# Patient Record
Sex: Male | Born: 1950 | Race: White | Hispanic: No | Marital: Married | Smoking: Never smoker
Health system: Southern US, Community
[De-identification: ages and names within clinical notes are randomized; demographics above are authoritative.]

## PROBLEM LIST (undated history)

## (undated) DIAGNOSIS — R002 Palpitations: Secondary | ICD-10-CM

## (undated) DIAGNOSIS — C4490 Unspecified malignant neoplasm of skin, unspecified: Secondary | ICD-10-CM

## (undated) DIAGNOSIS — E785 Hyperlipidemia, unspecified: Secondary | ICD-10-CM

## (undated) DIAGNOSIS — R519 Headache, unspecified: Secondary | ICD-10-CM

## (undated) DIAGNOSIS — R51 Headache: Secondary | ICD-10-CM

## (undated) DIAGNOSIS — I480 Paroxysmal atrial fibrillation: Secondary | ICD-10-CM

## (undated) DIAGNOSIS — I10 Essential (primary) hypertension: Secondary | ICD-10-CM

## (undated) DIAGNOSIS — I499 Cardiac arrhythmia, unspecified: Secondary | ICD-10-CM

## (undated) DIAGNOSIS — R079 Chest pain, unspecified: Secondary | ICD-10-CM

## (undated) HISTORY — PX: CARDIAC CATHETERIZATION: SHX172

## (undated) HISTORY — DX: Chest pain, unspecified: R07.9

## (undated) HISTORY — DX: Paroxysmal atrial fibrillation: I48.0

## (undated) HISTORY — PX: EYE SURGERY: SHX253

## (undated) HISTORY — DX: Hyperlipidemia, unspecified: E78.5

## (undated) HISTORY — PX: CHOLECYSTECTOMY: SHX55

## (undated) HISTORY — PX: HERNIA REPAIR: SHX51

## (undated) HISTORY — PX: FOOT SURGERY: SHX648

## (undated) HISTORY — DX: Palpitations: R00.2

## (undated) HISTORY — DX: Essential (primary) hypertension: I10

## (undated) HISTORY — DX: Unspecified malignant neoplasm of skin, unspecified: C44.90

## (undated) HISTORY — PX: KNEE SURGERY: SHX244

---

## 2004-04-18 ENCOUNTER — Ambulatory Visit: Payer: Self-pay | Admitting: Internal Medicine

## 2005-02-17 ENCOUNTER — Ambulatory Visit: Payer: Self-pay | Admitting: Gastroenterology

## 2006-02-03 ENCOUNTER — Ambulatory Visit: Payer: Self-pay

## 2006-04-27 ENCOUNTER — Ambulatory Visit (HOSPITAL_COMMUNITY): Admission: RE | Admit: 2006-04-27 | Discharge: 2006-04-27 | Payer: Self-pay | Admitting: Cardiovascular Disease

## 2008-08-20 ENCOUNTER — Emergency Department: Payer: Self-pay | Admitting: Unknown Physician Specialty

## 2008-08-21 ENCOUNTER — Emergency Department (HOSPITAL_COMMUNITY): Admission: EM | Admit: 2008-08-21 | Discharge: 2008-08-21 | Payer: Self-pay | Admitting: Emergency Medicine

## 2008-08-28 ENCOUNTER — Inpatient Hospital Stay (HOSPITAL_COMMUNITY): Admission: RE | Admit: 2008-08-28 | Discharge: 2008-08-31 | Payer: Self-pay | Admitting: Specialist

## 2008-08-30 ENCOUNTER — Encounter (INDEPENDENT_AMBULATORY_CARE_PROVIDER_SITE_OTHER): Payer: Self-pay | Admitting: Specialist

## 2008-09-23 ENCOUNTER — Observation Stay (HOSPITAL_COMMUNITY): Admission: EM | Admit: 2008-09-23 | Discharge: 2008-09-24 | Payer: Self-pay | Admitting: Emergency Medicine

## 2008-09-23 ENCOUNTER — Ambulatory Visit: Payer: Self-pay | Admitting: Gastroenterology

## 2009-01-11 ENCOUNTER — Ambulatory Visit (HOSPITAL_COMMUNITY): Admission: RE | Admit: 2009-01-11 | Discharge: 2009-01-11 | Payer: Self-pay | Admitting: Specialist

## 2010-04-24 ENCOUNTER — Ambulatory Visit: Payer: Self-pay | Admitting: Otolaryngology

## 2010-05-16 ENCOUNTER — Ambulatory Visit: Payer: Self-pay | Admitting: Otolaryngology

## 2010-05-27 ENCOUNTER — Encounter: Payer: Self-pay | Admitting: Otolaryngology

## 2010-06-02 ENCOUNTER — Encounter: Payer: Self-pay | Admitting: Otolaryngology

## 2010-06-05 LAB — BASIC METABOLIC PANEL
BUN: 23 mg/dL (ref 6–23)
Calcium: 9.5 mg/dL (ref 8.4–10.5)
Glucose, Bld: 94 mg/dL (ref 70–99)
Sodium: 139 mEq/L (ref 135–145)

## 2010-06-05 LAB — CBC
HCT: 48.7 % (ref 39.0–52.0)
MCHC: 34.4 g/dL (ref 30.0–36.0)
Platelets: 252 10*3/uL (ref 150–400)
RDW: 13.4 % (ref 11.5–15.5)
WBC: 7.3 10*3/uL (ref 4.0–10.5)

## 2010-06-09 LAB — CARDIAC PANEL(CRET KIN+CKTOT+MB+TROPI)
CK, MB: 0.6 ng/mL (ref 0.3–4.0)
CK, MB: 0.7 ng/mL (ref 0.3–4.0)
Relative Index: INVALID (ref 0.0–2.5)
Relative Index: INVALID (ref 0.0–2.5)
Total CK: 50 U/L (ref 7–232)
Total CK: 56 U/L (ref 7–232)

## 2010-06-09 LAB — COMPREHENSIVE METABOLIC PANEL
ALT: 20 U/L (ref 0–53)
AST: 20 U/L (ref 0–37)
Albumin: 4.2 g/dL (ref 3.5–5.2)
CO2: 24 mEq/L (ref 19–32)
Calcium: 9.4 mg/dL (ref 8.4–10.5)
Chloride: 104 mEq/L (ref 96–112)
GFR calc Af Amer: >60 mL/min (ref >60)
GFR calc non Af Amer: >60 mL/min (ref >60)
Potassium: 3.5 mEq/L (ref 3.5–5.1)
Sodium: 138 mEq/L (ref 135–145)
Total Bilirubin: 1 mg/dL (ref 0.3–1.2)
Total Protein: 6.9 g/dL (ref 6.0–8.3)

## 2010-06-09 LAB — DIFFERENTIAL
Basophils Absolute: 0 10*3/uL (ref 0.0–0.1)
Basophils Relative: 0 % (ref 0–1)
Basophils Relative: 0 % (ref 0–1)
Eosinophils Relative: 2 % (ref 0–5)
Lymphocytes Relative: 27 % (ref 12–46)
Lymphocytes Relative: 28 % (ref 12–46)
Lymphs Abs: 2 10*3/uL (ref 0.7–4.0)
Monocytes Absolute: 0.6 10*3/uL (ref 0.1–1.0)
Monocytes Relative: 7 % (ref 3–12)
Monocytes Relative: 8 % (ref 3–12)
Neutro Abs: 4.4 10*3/uL (ref 1.7–7.7)
Neutrophils Relative %: 61 % (ref 43–77)

## 2010-06-09 LAB — TSH: TSH: 1.067 u[IU]/mL (ref 0.350–4.500)

## 2010-06-09 LAB — BASIC METABOLIC PANEL
BUN: 21 mg/dL (ref 6–23)
CO2: 28 mEq/L (ref 19–32)
Creatinine, Ser: 0.93 mg/dL (ref 0.4–1.5)
Glucose, Bld: 105 mg/dL — ABNORMAL HIGH (ref 70–99)

## 2010-06-09 LAB — CBC
Hemoglobin: 14.1 g/dL (ref 13.0–17.0)
MCHC: 34.5 g/dL (ref 30.0–36.0)
RBC: 4.31 MIL/uL (ref 4.22–5.81)
RDW: 13.4 % (ref 11.5–15.5)
WBC: 7.2 10*3/uL (ref 4.0–10.5)
WBC: 8.9 10*3/uL (ref 4.0–10.5)

## 2010-06-09 LAB — POCT CARDIAC MARKERS
CKMB, poc: <1 ng/mL — ABNORMAL LOW (ref 1.0–8.0)
Myoglobin, poc: 41.3 ng/mL (ref 12–200)

## 2010-06-09 LAB — LIPASE, BLOOD: Lipase: 39 U/L (ref 11–59)

## 2010-06-09 LAB — D-DIMER, QUANTITATIVE: D-Dimer, Quant: 0.82 ug/mL-FEU — ABNORMAL HIGH (ref 0.00–0.48)

## 2010-06-09 LAB — CK TOTAL AND CKMB (NOT AT ARMC)
CK, MB: 0.7 ng/mL (ref 0.3–4.0)
Total CK: 65 U/L (ref 7–232)

## 2010-06-10 LAB — BASIC METABOLIC PANEL
CO2: 29 mEq/L (ref 19–32)
Calcium: 8.6 mg/dL (ref 8.4–10.5)
Creatinine, Ser: 0.91 mg/dL (ref 0.4–1.5)
GFR calc Af Amer: >60 mL/min (ref >60)
GFR calc non Af Amer: >60 mL/min (ref >60)

## 2010-06-10 LAB — COMPREHENSIVE METABOLIC PANEL
ALT: 36 U/L (ref 0–53)
AST: 31 U/L (ref 0–37)
Alkaline Phosphatase: 81 U/L (ref 39–117)
CO2: 28 mEq/L (ref 19–32)
Calcium: 9.6 mg/dL (ref 8.4–10.5)
GFR calc Af Amer: >60 mL/min (ref >60)
Potassium: 4.7 mEq/L (ref 3.5–5.1)
Sodium: 140 mEq/L (ref 135–145)
Total Protein: 7.3 g/dL (ref 6.0–8.3)

## 2010-06-10 LAB — URINE MICROSCOPIC-ADD ON

## 2010-06-10 LAB — CBC
Hemoglobin: 17.7 g/dL — ABNORMAL HIGH (ref 13.0–17.0)
MCHC: 34.8 g/dL (ref 30.0–36.0)
MCHC: 35 g/dL (ref 30.0–36.0)
MCV: 95.5 fL (ref 78.0–100.0)
Platelets: 233 10*3/uL (ref 150–400)
RBC: 4.81 MIL/uL (ref 4.22–5.81)
RBC: 5.34 MIL/uL (ref 4.22–5.81)
WBC: 11.8 10*3/uL — ABNORMAL HIGH (ref 4.0–10.5)
WBC: 12.4 10*3/uL — ABNORMAL HIGH (ref 4.0–10.5)

## 2010-06-10 LAB — URINALYSIS, ROUTINE W REFLEX MICROSCOPIC
Glucose, UA: NEGATIVE mg/dL
Specific Gravity, Urine: 1.045 — ABNORMAL HIGH (ref 1.005–1.030)
pH: 5 (ref 5.0–8.0)

## 2010-06-10 LAB — T4, FREE: Free T4: 2.14 ng/dL — ABNORMAL HIGH (ref 0.80–1.80)

## 2010-06-10 LAB — DIFFERENTIAL
Basophils Relative: 0 % (ref 0–1)
Eosinophils Absolute: 0.1 10*3/uL (ref 0.0–0.7)
Eosinophils Relative: 1 % (ref 0–5)
Lymphs Abs: 1.6 10*3/uL (ref 0.7–4.0)
Monocytes Relative: 7 % (ref 3–12)

## 2010-06-10 LAB — T3, FREE: T3, Free: 3 pg/mL (ref 2.3–4.2)

## 2010-06-10 LAB — TROPONIN I: Troponin I: 0.01 ng/mL (ref 0.00–0.06)

## 2010-07-02 ENCOUNTER — Encounter: Payer: Self-pay | Admitting: Otolaryngology

## 2010-07-16 NOTE — Discharge Summary (Signed)
NAME:  Neil Bush, Neil Bush NO.:  [id number]   MEDICAL RECORD NO.:  [id number]          PATIENT TYPE:  INP   LOCATION:                               FACILITY:  [facility]   PHYSICIAN:  Jene Every, M.D.    DATE OF BIRTH:  09-07-1950   DATE OF ADMISSION:  08/28/2008  DATE OF DISCHARGE:  08/31/2008                               DISCHARGE SUMMARY   ADMISSION DIAGNOSES:  1. A right tubal plateau fracture.  2. Hypertension.  3. Hypercholesteremia.   DISCHARGE DIAGNOSES:  1. A right tubal plateau fracture, status post open reduction,      internal fixation of the right tibial plateau.  2. Hypertension.  3. Hypercholesteremia.  4. Resolved tachycardia.  5. Abnormal thyroid function tests.   HISTORY:  Mr. Clapper is a pleasant 60 year old gentleman who injured  himself on a horse, passed out, falling and landing on his right leg.  He was evaluated by Dr. Jene Every, who felt he would need a fixation  of a tibial plateau fracture.  The risks and benefits of this were  discussed with the patient.  He does wish to proceed.   PROCEDURE:  The patient was taken to the OR on June 28th and underwent  an ORIF and meniscal repair of the right tibial plateau fracture.  Surgeon:  Dr. Jene Every and assistant Roma Schanz,  P.O..A.-C.  Anesthesia was  general.  Complications:  None.  Tourniquet time:  Two  hours.   CONSULTANT:  PT and OT,  [facility],  Care Management.   LABORATORY:  Preoperative CBC shows a white cell count of 10, hemoglobin  7, hematocrit 50.6.  These were monitored throughout the hospital  course.  Hemoglobin remained stable at the time of discharge at  14.4,  hematocrit 41.8, white blood cell count at discharge was 11.8.  Routine  coagulation studies done preoperatively were within normal range.  Preoperative chemistries as well as postoperative chemistries were all  within normal range, with a sodium of 137, potassium 4.3 at time of  discharge.  GFR was greater than 50.  Routine liver function tests were  within normal range.   Electrocardiogram done, secondary to tachycardia.  No elevation was  noted there.   The patient's thyroid testing did show a low TSH as well as a high T4,  normal T3.  Preop urinalysis showed small hemoglobin, small bilirubin,  30 mg/dL protein, 0 to 2 WBCs noted per high-powered field, 3 to 6  RBCs  noted per high-powered field.   Preoperative EKG showed normal sinus rhythm.  Postoperative  electrocardiogram done, secondary to tachycardia and showed sinus  tachycardia with no evidence of any ST wave abnormalities.   Preoperative chest x-ray showed no acute cardiopulmonary disease.   HOSPITAL COURSE:  The patient was admitted and taken to the OR and  underwent the above-stated procedure without difficulties.  He was then  transferred to the PACU and then to the orthopedic floor for his  postoperative  care.  The patient did have some issues with tachycardia  with pulse ranging from 110 to  125 and slightly elevated blood pressure  at 150/95.  The patient was fairly asymptomatic from this.  He did have  a previous history of an arrhythmia, but no cardiac diagnosis was  officially given.  On postoperative day #1 the patient was doing fairly  well.  The pain was controlled.  He did not note any chest pain or  shortness of breath.  He was voiding without difficulty.  The heart rate  at this time was around 112, BP was 144/85.  He had saturated at 96% on  2 liters.  Hemoglobin was stable.  At this point we will continue with  ice and elevation to the lower extremity.  PT and OT were consulted for  range of motion of the knee.  We weaned the patient from the PCA.  Currently the patient is using PAS and TEDs for DVT prophylaxis.   On postoperative day number two  Lovenox will be started.  The patient  continued to do fairly well.  He continued to have some issues with  tachycardia.  We did at  this point consult the [facility] for  complete evaluation.  They did a thorough workup, including checking  troponin levels which were negative.  He did have thyroid function  tests, which did show a decreased TSH and elevated free T4, but had no  treatment was initiated for this.  They recommended following with an  endocrinologists, secondary to the patient being fairly asymptomatic  from this.   On postoperative day number three the patient was doing much better.  He  noted no nausea.  He has had a bowel movement, which he states made him  feels significantly better.  His heart rate has normalized to 83,  respiratory 20, blood pressure 158/84.  The dressing was changed.  The  incision was clean, dry and intact with only minimal bloody drainage.  He had minimal edema.  Motor and sensory functions were intact in both  lower extremities.   At this point felt that the patient was stable to be discharged home.  He was cleared by the internal medicine physician.   DISPOSITION:  The patient discharged home with home health PT and OT.   FOLLOWUP:  1. He is to follow up with Dr. Shelle Iron in approximately 10 to 14 days      for suture removal and x-ray.  2. To follow up with his primary care physician with regards to      elevated LFTs.  These may need to be repeated.   DISCHARGE INSTRUCTIONS:  Dressing changes daily.  Keep his incision  clean and dry.   DISCHARGE MEDICATIONS:  1. Vitamin C, 500 mg daily.  2. Percocet p.r.n. pain.  3. Robaxin p.r.n. spasm.  4. Colace b.i.d. as needed.  5. Lovenox x10 days.   DISCHARGE ACTIVITIES:  The patient's activity should be non-  weightbearing to the left lower extremity and elevate it at least for 20  minutes at a time.  Use a knee immobilizer when out of bed as tolerated.   CONDITION ON DISCHARGE:  Doing well status post ORIF of  a right tibial  plateau fracture.      Roma Schanz, P.A.      Jene Every, M.D.   Electronically Signed    CS/MEDQ  D:  09/14/2008  T:  09/14/2008  Job:  [id number]

## 2010-07-16 NOTE — H&P (Signed)
NAME:  Neil Bush, Neil Bush NO.:  [id number]   MEDICAL RECORD NO.:  [id number]          PATIENT TYPE:  INP   LOCATION:  1443                         FACILITY:  [facility]   PHYSICIAN:  Della Goo, M.D. DATE OF BIRTH:  June 26, 1950   DATE OF ADMISSION:  09/23/2008  DATE OF DISCHARGE:                              HISTORY & PHYSICAL   PRIMARY CARE PHYSICIAN:  Unassigned.   CHIEF COMPLAINT:  Chest pain.   HISTORY OF PRESENT ILLNESS:  This is a 60 year old male who presented to  the emergency department with complaints of severe chest pain which  started at about 3 p.m.  The patient reports having mid chest area  discomfort described as being a burning discomfort that had radiation  into the left chest and left arm.  The patient does report having mild  shortness of breath and nausea associated with the event.  He denies  having any diaphoresis, denies having any vomiting.  The patient reports  that he has had mild discomfort off and on, however this discomfort  persisted.  He reports still having discomfort at this time and had  moderate relief, but not complete relief, after being given IV Protonix  therapy.   PAST MEDICAL HISTORY:  Significant for hypertension, hyperlipidemia.   PAST SURGICAL HISTORY:  History of a recent open reduction internal  fixation of the right knee June 2010 after a horse fell on his leg.  Also history of a left inguinal hernia repair, cholecystectomy, and  vasectomy.  The patient also had a cardiac catheterization in February  2008 performed by [facility], Dr. Nanetta Batty,  with normal results.   MEDICATIONS AT THIS TIME:  Include lisinopril, Lipitor, aspirin and  patient had been taking oxycodone/APAP 10/325 mg 1 p.o. q.6 hours p.r.n.  pain.  However, of note, patient reports noticing that he has  tachycardia when he takes the oxycodone/APAP.   ALLERGIES:  To PENICILLIN.   SOCIAL HISTORY:  The patient is married.   He is a nonsmoker, nondrinker  and no history of illicit drug usage.   FAMILY HISTORY:  Is positive for coronary artery disease in both parents  and 1 sister and in 1 brother.  Positive for hypertension in both  parents.  Positive for diabetes in his father and positive for cancer,  his mother had a GI cancer.   REVIEW OF SYSTEMS:  Pertinents are mentioned above in the HPI.  All  other organ systems are negative.   PHYSICAL EXAMINATION FINDINGS:  This is a 60 year old well-nourished,  well-developed male in no visible discomfort or acute distress.  VITAL SIGNS:  Temperature 97.4, blood pressure 159/94, heart rate 72,  respirations 20, O2 saturations 98% on room air.  HEENT:  Normocephalic, atraumatic.  There is no scleral icterus.  Pupils  equally round, reactive to light.  Extraocular movements are intact.  Funduscopic benign.  Nares are patent bilaterally.  Oropharynx is clear.  NECK:  Supple, full range of motion.  No thyromegaly, adenopathy or  jugular venous distention.  CARDIOVASCULAR:  Regular rate, rhythm.  No murmurs, gallops, rubs.  LUNGS:  Clear to auscultation bilaterally.  No rales, rhonchi or  wheezes.  ABDOMEN:  Positive bowel sounds, soft, nontender, nondistended.  No  hepatosplenomegaly.  EXTREMITIES:  Without cyanosis, clubbing or edema.  There is a healing  surgical scar along the lateral aspect of the right knee.  There is  eschar formation and no signs of infection from this scar.  EXTREMITIES:  Without cyanosis, clubbing or edema, otherwise.  SKIN:  No unusual areas, masses, lesions.  NEUROLOGIC:  The patient is alert and oriented x3.  Cranial nerves are  intact and there are no focal deficits on examination.   LABORATORY STUDIES:  White blood cell count 8.9, hemoglobin 15.4,  hematocrit 45.5, platelets 213, neutrophils 64%, lymphocytes 27%.  Sodium 138, potassium 3.5, chloride 104, carbon dioxide 24, BUN 23,  creatinine 0.90 and glucose 141, albumin 4.2,  AST 20, ALT 20, lipase 39,  point of care cardiac markers with a myoglobin of 57.0, CK-MB less than  1.0, troponin less than 0.05.  electrocardiogram reveals a normal sinus  rhythm without acute ST-segment changes and chest x-ray reveals no acute  cardiopulmonary disease.  CT scan with angiogram study of the chest  performed negative for pulmonary embolism, no pericardial or pleural  effusions seen.  No mediastinal or axillary lymphadenopathy.   ASSESSMENT:  A 60 year old male being admitted with:  1. Chest pain.  2. Hypertension.  3. Hyperlipidemia.  4. Recent open reduction internal fixation of the right knee.   PLAN:  The patient has been admitted to a telemetry area for cardiac  monitoring.  Cardiac enzymes will be performed.  Patient will be placed  on nitro paste, oxygen and aspirin therapy.  DVT and GI prophylaxis have  also been ordered.  A GI cocktail has been ordered as well.  Further  workup will ensue pending results of patient's clinical course.      Della Goo, M.D.  Electronically Signed     HJ/MEDQ  D:  09/24/2008  T:  09/24/2008  Job:  [id number]

## 2010-07-16 NOTE — Op Note (Signed)
NAME:  LAQUENTIN, LOUDERMILK                ACCOUNT NO.:  [id number]   MEDICAL RECORD NO.:  [id number]          PATIENT TYPE:  INP   LOCATION:  0001                         FACILITY:  [facility]   PHYSICIAN:  Jene Every, M.D.    DATE OF BIRTH:  07/26/1950   DATE OF PROCEDURE:  08/28/2008  DATE OF DISCHARGE:                               OPERATIVE REPORT   PREOPERATIVE DIAGNOSIS:  Comminuted depressed lateral tibial plateau  fracture.   POSTOPERATIVE DIAGNOSES:  Comminuted depressed lateral tibial plateau  fracture, meniscal tear lateral.   PROCEDURE PERFORMED:  1. Open reduction, internal fixation of right proximal tibial plateau      utilizing the DePuy plating system.  2. Autologous and allograft bone grafts.  3. Repair of lateral meniscus.   ANESTHESIA:  General.   ASSISTANT:  Roma Schanz, P.A.   BRIEF HISTORY AND INDICATIONS:  This is a 60 year old male who had a  horse fall on him.  He sustained a comminuted depressed lateral tibial  plateau fracture, indicated for open reduction, internal fixation.  The  risks and benefits were discussed, including bleeding, infection, damage  to neurovascular structures, post-traumatic arthrosis, need for total  knee arthroplasty, DVT, PE, anesthetic complications, etc.   TECHNIQUE:  With the patient in supine position after the induction of  adequate general anesthesia and 1 gram of vancomycin Kefzol, the right  lower extremity was prepped, draped and exsanguinated in the usual  sterile fashion.  A thigh tourniquet inflated to 300 mmHg.  I used a  slight bump underneath the knee and a radiolucent table and C-arm.  A  lazy S incision was made lateral to get to the crest of the tibia, past  Gerdy's tubercle and above the iliotibial band.  Subcutaneous tissue was  dissected.  Electrocautery was utilized to achieve hemostasis.  The  fascia over the anterior compartment was incised and elevated off the  lateral aspect of the tibia.  We  subperiosteally elevated the iliotibial  band of Gerdy's tubercle partially.  We performed an arthrotomy of the  joint.  Hematoma evacuated.  The joint was irrigated.  A depressed  lateral tibial plateau fracture was noted.  There was in a tear at the  attachment of the lateral periphery of the lateral meniscus.  There was  a fracture along the anterior and lateral cortex.  I gently opened the  fracture site.  It was lateral to the tibial tubercle.  The tibial  tubercle was intact.  It was opened in a book like fashion.  The large  fragment of the articular surface was depressed downward about 24 mm.  With a series of manipulations utilizing a guidepin as a toggle and  elevation, this was elevated to the articular surface which was  evaluated under x-ray and there was a small articular fragment left on  the lateral condyle.  After this was elevated and felt to be anatomic in  the AP and lateral plane, the defect was then bone grafted with Actifuse  bone graft that had been mixed with some cancellous bone.  The fragments  were removed from  the joint, as well as from the canal of the tibia.  Actifuse was packed into that under x-ray and elevated such that the  void was completely filled.  The fragment of the lateral condyle was  then closed and pinned with a guidewire into the subchondral bone and  found to be in the AP and lateral plane satisfactory.  We used a DePuy  pre-contoured plate and we tried multiple positions and adjustments with  temporary fixation to that which was optimal.  Then proximally, we  placed first a locking, then nonlocking bicortical screws at the  appropriate drilling depth, gauge measurement with insertion of the  screws measuring 80 and 85 mm in length.  This compressed the fracture  site.  Hematoma was expressed.  Watching the articular surface it  appeared to be satisfactory.  Then distally we secured the plate with a  bicortical screw after appropriate  drilling, depth gauge measurement and  insertion of the appropriate length screw bicortically with excellent  purchase.  Three of these bicortical screws were placed distally and  another two of the locking screws bicortical were placed proximally.  We  had multiple screws within the elevated fragment and through the  fracture site.  Following this, the AP and lateral plane was found to be  satisfactory and anatomic.  Actifuse was then bone grafted behind the  plate as well, just a small area of the anterolateral aspect of the  joint that was devoid of cartilage.  The meniscus that had been tagged  sutured and elevated cephalad to promote visualization of the fracture  site was then reduced after the joint was irrigated.  There were no  fragments within the joint.  The coronary ligament of the meniscus and  the meniscus femoral ligaments were repaired to the plate with #1  Ethibond interrupted figure-of-eight sutures into the fascia of the  iliotibial band, as well as to the lateral capsule.  A watertight  closure was obtained and we repaired the arthrotomy with #1 Vicryl  interrupted figure-of-eight sutures as well.  We reflected the  iliotibial band as best possible and repaired to the plate, as well as  to the underlying fascia.  A Hemovac was placed and brought out through  a lateral anterior stab wound in the skin.  The fascia with the  compartment anteriorly was left open.  The AP and lateral plane was  anatomic.  The wound copiously irrigated.  I then repaired the subcu  with 2-0 Vicryl simple sutures and the skin was reapproximated with  staples.  The tourniquet was deflated at 2 hours and there was adequate  revascularization of the lower extremity appreciated.  Good pulses noted  distally just prior to that and good stability with light varus-valgus  stressing at 0 and 30 degrees.  I was unable to appreciate any  recurvatum or Lachman.  The patient was placed in a sterile  dressing and  knee immobilizer, extubated without difficulty and transferred to the  recovery room in satisfactory addition.   The patient tolerated the procedure well with no complications.   BLOOD LOSS:  Minimal.      Jene Every, M.D.  Electronically Signed     JB/MEDQ  D:  08/28/2008  T:  08/28/2008  Job:  [id number]

## 2010-07-16 NOTE — Consult Note (Signed)
NAME:  Neil Bush, Neil Bush                ACCOUNT NO.:  [id number]   MEDICAL RECORD NO.:  [id number]          PATIENT TYPE:  INP   LOCATION:  1607                         FACILITY:  [facility]   PHYSICIAN:  Richarda Overlie, MD       DATE OF BIRTH:  05/19/1950   DATE OF CONSULTATION:  DATE OF DISCHARGE:                                 CONSULTATION   REQUESTING Neftali Thurow:  Dr. Jene Every.   REASON FOR CONSULTATION:  Tachycardia.   SUBJECTIVE:  This is a 60 year old male who was admitted on the 28th  because of knee pain and is status post open reduction internal fixation  of the right proximal tibial third toe on the 2th by Dr. Jene Every.  The patient states that he has been doing fairly well up until this  point.  The consultation was requested for episodes of sinus tachycardia  that were noted on his routine vitals today.  The patient states that he  has had episodes of tachycardia and a skipped beat every now and then  for the last couple of years.  He used to have these episodes fairly  frequently in 2008 for which he was evaluated by [facility] and he had a cardiac cath done at that time that showed  his coronary arteries to be essentially normal.  The patient at that  time was asked to cut back on his caffeine and chocolate and his  episodes have since then become quite infrequent.  He was never  diagnosed with any dysrhythmias in particular.  He denies any chest  pain, shortness of breath, orthopnea, paroxysmal nocturnal dyspnea,  peripheral edema.  He states that he was somewhat nauseous this morning  and was on a PCA up until this morning but now his nausea has resolved  and his pain is much better controlled.  The patient also states that  his episode of tachycardia this morning coincided with exacerbation of  his knee pain.  He denies any other cardiopulmonary symptoms.   PAST MEDICAL HISTORY:  1. Hypertension.  2. Dyslipidemia.   PAST SURGICAL  HISTORY:  1. Cholecystectomy.  2. Hernia repair.  3. Vasectomy.  4. Open reduction internal fixation of the right proximal tibial      plateau fracture.   ALLERGIES:  No known drug allergies.   ALLERGIES:  PENICILLIN, causes a rash.   HOME MEDICATIONS:  1. Lisinopril 10 mg p.o. daily.  2. Lipitor 40 mg p.o. half a tablet daily.  3. Oxycodone/APAP 5/325 one to 2 tablets as needed.  4. Aspirin 81 mg p.o. daily.   PHYSICAL EXAMINATION:  VITAL SIGNS:  Blood pressure 146/82, pulse of 92,  respirations 16, temperature 97.7, 99% on 2 liters.  GENERAL:  The patient is alert, oriented, comfortable currently, no  acute distress.  HEENT:  Pupils equal and reactive.  Extraocular movements intact.  LUNGS:  Clear to auscultation bilaterally.  No wheezes, no crackles.  CARDIOVASCULAR:  Regular rate and rhythm.  No appreciable murmurs, rubs  or gallops.  ABDOMEN:  Soft, nontender, nondistended.  EXTREMITIES:  Without  cyanosis, clubbing or edema.  NEUROLOGIC:  Cranial nerves II-XII grossly intact.  PSYCHIATRIC:  Appropriate mood and affect.   Electrocardiogram shows sinus rhythm with a ventricular rate of 96 beats  per minute.  CBC:  WBC 12.4, hemoglobin 16.0, hematocrit 45.9, platelet  count of 241.  BMET shows a sodium of 137, potassium 4.3, chloride 101,  bicarbonate 29, glucose 106, BUN 19, creatinine 0.91, calcium 8.6.  Urinalysis specific gravity of 1.045, negative for nitrites and  leukocyte esterase.   CURRENT MEDICATIONS:  1. Colace 100 mg p.o. b.i.d.  2. Lovenox 30 mg subcu q. 12.  3. Lisinopril 5 mg p.o. nightly.  4. Morphine sulfate 25 mg IV q.4 h. p.r.n.  5. Zocor 40 mg p.o. nightly.  6. Vancomycin 1000 mg IV q.12.  7. Tylenol 325-650 mg p.o. q.4 h.  8. Dulcolax 10 mg p.o. daily p.r.n.  9. Chloraseptic spray.  10.Benadryl 12.5 to 25 IV q.6 p.r.n.  11.Robaxin 500 mg p.o. q.6 h. p.r.n.  12.Reglan 5 mg to 10 mg p.o. and IV q.8 h. p.r.n.  13.Zofran 4 mg IV q.6 h. p.r.n.   14.Percocet 1-2 tablets every 3-4 hours as needed.  15.Ambien 5 mg p.o. nightly.   ASSESSMENT/PLAN:  Sinus tachycardia.  The patient does report a history  of having palpitations in the past with a negative workup.  The patient  will have basic workup with a thyroid function test and 2-D  echocardiogram, serial troponins and a repeat electrocardiogram.  If  these are negative, his sinus tachycardia is probably related to his  pain.  Of note is that he also has a low-grade fever.  Therefore, chest  x-ray will be done to rule out a pneumonia.  His urinalysis was negative  at the time of admission.  Would continue the patient on DVT  prophylaxis.  A pulmonary embolism is definitely a possibility but the  patient does not report any cardiopulmonary symptoms at all and his  oxygen requirements have been stable.  He is 99% on 2 liters.  The  patient's heart rate is actually improved after he received some pain  medication and is currently in the 90s.  Will follow along.   I thank you Dr. Shelle Iron for this consult.      Richarda Overlie, MD  Electronically Signed     NA/MEDQ  D:  08/29/2008  T:  08/29/2008  Job:  [id number]

## 2010-07-16 NOTE — Consult Note (Signed)
NAME:  Neil Bush, Neil Bush NO.:  [id number]   MEDICAL RECORD NO.:  [id number]          PATIENT TYPE:  INP   LOCATION:  1443                         FACILITY:  [facility]   PHYSICIAN:  Rachael Fee, MD   DATE OF BIRTH:  1950-12-27   DATE OF CONSULTATION:  DATE OF DISCHARGE:                                 CONSULTATION   I was asked to evaluate Neil Bush in consultation regarding chest pain,  burning in chest by his [facility] doctor, Dr. Jomarie Longs.   HISTORY OF PRESENT ILLNESS:  Neil Bush is a very pleasant 60 year old  man who has had intermittent GERD like symptoms for a year.  These  include regurgitation, pyrosis.  He will reach for Rolaids once every 2-  3 weeks.  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  He was having dyspeptic symptoms 2-3 years ago when he  underwent his EGD.  He tells me that that was essentially negative.  He  had eventual cholecystectomy without any change in his symptoms.  He was  also having some chest pains at that time and underwent cardiac  evaluation including stress test and angiogram.  Those were all normal.  One month ago he had surgery on his right knee.  Since then he has  increased his daily NSAID consumption from 2 pills 3 times a week to 2  pills nightly.  He was admitted to the hospital very early this morning  with burning in his chest, radiation to his right arm, no nausea, no  vomiting, but there was some mild shortness of breath.  Cardiac workup  was performed and it was essentially negative.  I was asked to see him  regarding the possibility of this being a GI complaint.  Of note, he has  been given Mylanta several times throughout the day and every time he  was given it it reliably resolved his symptoms.  He has had no  dysphagia, no overt GI bleeding, no dramatic weight losses.   REVIEW OF SYSTEMS:  Complete review  of systems was otherwise negative.   PAST MEDICAL HISTORY:  Hypertension, elevated lipids.   PAST SURGICAL HISTORY:  Right knee surgery June 2010, inguinal hernia  repair, cholecystectomy, vasectomy.   HOME MEDICATIONS PRIOR TO ADMISSION:  Lisinopril, Lipitor, aspirin,  intermittent Vicodin.   ALLERGIES:  PENICILLIN.   SOCIAL HISTORY:  He is married, nondrinker, nonsmoker, he does not drink  much caffeine.  He does chew mint gum nearly daily but none in the past  month.   FAMILY HISTORY:  No known colon cancer.  No esophageal cancer.   LABORATORY WORKUP:  At admission normal CBC, normal complete metabolic  profile.   IMAGING WORKUP:  CT angiogram was negative for pulmonary embolus.   PHYSICAL EXAMINATION:  VITAL SIGNS:  Temperature 98.2, pulse 61, 16  respirations, blood pressure 138/85, satting 99% on 2 liters.  GENERAL:  Alert and oriented x3.  EYES:  Extraocular movements intact.  MOUTH:  Oropharynx  moist, no lesions.  NECK:  Supple, no lymphadenopathy.  CARDIOVASCULAR:  Regular rate and rhythm.  LUNGS:  Clear to auscultation bilaterally.  ABDOMEN:  Soft, nontender, nondistended, normal bowel sounds.  EXTREMITIES:  No lower extremity edema.   ASSESSMENT AND PLAN:  A 60 year old man with chest pain likely  gastrointestinal related.  He has baseline mild gastroesophageal reflux  disease.  He has been taking 2 Aleve a day since his right leg surgery  and I suspect this has caused some gastritis.  That could present with  burning in the chest as well.  He does not take regular proton pump  inhibitor and so I have recommended that he begin taking Nexium one pill  20-30 minutes before his breakfast meal on a daily basis.  I also  recommended that he cut back on his Aleve as best as he can.  His  cardiologist was curbsided by the admitting physician and they said that  they would not be proceeding with any kind of cardiac testing given his  negative angiogram 1-2 years ago.  I  told him that I am happy to see him  in followup in 4-5 weeks if needed or he can simply follow up with his  local gastroenterologist in [place], [place].      Rachael Fee, MD  Electronically Signed     DPJ/MEDQ  D:  09/24/2008  T:  09/24/2008  Job:  [id number]   cc:   Zannie Cove, MD

## 2010-07-16 NOTE — Consult Note (Signed)
NAME:  Neil Bush, Neil Bush NO.:  [id number]   MEDICAL RECORD NO.:  [id number]          PATIENT TYPE:  EMS   LOCATION:  MINO                         FACILITY:  [facility]   PHYSICIAN:  Jene Every, M.D.    DATE OF BIRTH:  01/08/1951   DATE OF CONSULTATION:  DATE OF DISCHARGE:  08/21/2008                                 CONSULTATION   CHIEF COMPLAINT:  Right knee pain.   HISTORY:  This is a 60 year old male who presents to [facility] Emergency Room  for right knee pain.  He was injured up in [place] this past Saturday in  the morning, had a horse apparently hit him from the side.  Acute pain,  was seen up at the [facility] Room, diagnosed with a fracture,  given a brace, told to follow up at home to be operated on in the  vicinity of his home.  He is from [place].  He attempted to call his  home orthopedist, could not see him until later this month.  He  therefore presented to the emergency room at [facility] with knee  pain.  No numbness or tingling.  He was given a Bledsoe-type brace that  was locked in flexion.  He had no fevers or chills, chest pain,  shortness of breath.  He has been taking oxycodone 1 every 6 hours,  which does not seem to control the pain.   PAST MEDICAL HISTORY:  Hypertension.   PAST SURGICAL HISTORY:  1. Cholecystectomy.  2. Hernia repair.  3. Vasectomy.   Tobacco negative, alcoholic beverages negative.  Again, no history of  DVT.  The patient within the family.   The patient underwent x-rays here at the emergency room, which  demonstrated depressed lateral tibial plateau fracture.   Subsequent CT scan confirmed that possible fracture into the tibial  spine.   PHYSICAL EXAMINATION:  GENERAL:  Healthy, moderate distress.  Mood and  affect is appropriate.  CARDIAC:  Regular rate and rhythm.  PULMONARY:  Clear to auscultation.  ABDOMEN:  Soft, nontender.  EXTREMITIES:  Right knee is in an Ace bandage above the knee and down to  the  mid ankle with soft tissue swelling of the ankle.  The ACE bandage  was removed.  Moderate soft tissue swelling in leg as noted and moderate  effusion.  Compartments however are soft.  Ipsilateral hip and ankle  exam is unremarkable, 2+ dorsalis pedis and posterior tibial pulse.  He  has good sensation.  Good dorsiflexion and plantar flexion.  No pain  with extension or flexion of the toe.  He is tender over the lateral  joint line and the proximal lateral tibia nontender medially.   Radiographs of the knee demonstrates a depressed comminuted fracture of  lateral tibial plateau.  No involvement of the medial tibial plateau.   CT scan demonstrates comminuted, depressed lateral tibial plateau  fracture, almost 3 cm of depression and involvement into the tibial  spine.   IMPRESSION:  1. Subacute lateral tibial plateau fracture with involvement of tibial      spine comminution.  The  patient is neurovascular intact.  Moderate      soft tissue swelling.  2. Hypertension.   PLAN:  Discussed with Mr. Chacko concerning his current pathology and  treatment options indicated.  At this point in time, I would recommend  open reduction and internal fixation when the swelling has reduced and  the soft tissue swelling has had time to rest.  I offered him admission  in the hospital for pain control, wrist, ice, elevation, etc.  He  declined and would like to do this at home.  I therefore discussed  appropriate signs of monitoring and appropriate elevation and  essentially leg at cardiac level.  Placement of Jones dressing loosely  wrapped and then the Bledsoe brace at 30 degrees locked and demonstrated  the appropriate elevation.  He will remain non-weightbearing.  He will  see me in the office in 2 weeks.  Gave him prescription for Percocet  5/325 to be taken 1-2 p.o. q.4 h. p.r.n. pain.  Also may take Aleve,  aspirin for DVT prophylaxis.  He will see me in 2 days.  Discussed ORIF  within a week  once the soft tissue swelling has reduced.  His wife was  present and I discussed that.  I gave then my beeper number at home.  If  they have any problems with increasing pain, swelling, numbness,  tingling, etc., he is to present to the emergency room.      Jene Every, M.D.  Electronically Signed     JB/MEDQ  D:  08/21/2008  T:  08/22/2008  Job:  [id number]

## 2010-07-16 NOTE — H&P (Signed)
NAME:  Neil Bush, PETRUZZI NO.:  [id number]   MEDICAL RECORD NO.:  [id number]          PATIENT TYPE:  INP   LOCATION:  1607                         [facility]ACILITY:  [facility]   PHYSICIAN:  Jene Every, M.D.    DATE OF BIRTH:  March 08, 1950   DATE OF ADMISSION:  08/28/2008  DATE OF DISCHARGE:  08/31/2008                              HISTORY & PHYSICAL   CHIEF COMPLAINT:  Right knee pain.   HISTORY:  Mr. Imbert is a 60 year old gentleman who lives up in [place]  riding a horse when the horse passed out landing on the patient's right  lower extremity.  He noted immediate onset of pain.  He was brought by  EMS to the hospital up there and diagnosed with a displaced tibial  plateau fracture, splinted and sent home.  The patient did follow up  with Dr. Ermelinda Das clinic.  Dr. Shelle Iron evaluated the patient and felt he  would need ORIF of the tibia plateau fracture.  The risks and benefits  of this were discussed with the patient.  He does wish to proceed.   PAST MEDICAL HISTORY:  1. Hypertension.  2. Hypercholesteremia.   CURRENT MEDICATIONS:  1. Lisinopril.  2. Lipitor.  3. Oxycodone.  4. Aspirin.   ALLERGIES:  PENICILLIN which causes a rash.   REVIEW OF SYSTEMS:  A 12-point system was reviewed and show no  abnormalities other than HPI.   PHYSICAL EXAMINATION:  GENERAL:  This is a well-developed, well-  nourished gentleman seen upright in no acute distress.  HEENT: Atraumatic, normocephalic.  Pupils are equal round and reactive  to light.  EOMs intact.  NECK:  Supple.  No lymphadenopathy.  CHEST:  Clear to auscultation bilaterally.  HEART:  Regular rate and rhythm.  No murmurs, gallops or rubs.  ABDOMEN:  Soft, nontender.  Bowel sounds x4.  EXTREMITIES:  In regard to the lower extremity, he does have diffuse  edema along the right knee and into the calf.  Compartments are soft.  He does have a moderate knee effusion.  Motor and neurovascular function  is intact.  He has pain  with range of motion.   IMPRESSION:  Right tibial plateau fracture.   PLAN:  The patient will be admitted to [facility] to undergo  ORIF of the tibial plateau fracture.      Roma Schanz, P.A.      Jene Every, M.D.  Electronically Signed    CS/MEDQ  D:  09/14/2008  T:  09/14/2008  Job:  [id number]

## 2010-07-19 NOTE — Discharge Summary (Signed)
NAME:  Neil Bush, Neil Bush NO.:  [id number]   MEDICAL RECORD NO.:  [id number]          PATIENT TYPE:  INP   LOCATION:  1443                         [facility]ACILITY:  [facility]   PHYSICIAN:  Zannie Cove, MD     DATE OF BIRTH:  [birth year]   DATE OF ADMISSION:  09/23/2008  DATE OF DISCHARGE:  09/24/2008                               DISCHARGE SUMMARY   PRIMARY CARE PHYSICIAN:  None.   DISCHARGE DIAGNOSES:  1. Chest pain, most likely gastrointestinal secondary to acid reflux,      resolved.  2. Hypertension.  3. Hyperlipidemia.   CONSULTATIONS:  1. Rachael Fee, MD from GI.  2. [facility] via telephone on September 24, 2008.   HOSPITAL COURSE:  For admission details, please see the note dictated by  Dr. Lovell Sheehan on September 23, 2008.  Briefly, Mr. Wedemeyer is a 60 year old  gentleman who was admitted with epigastric and chest pain on the night  of September 23, 2008 which was described as a burning discomfort radiating  to the right arm associated with some nausea.  He was observed overnight  in the hospital.  Of note, he has had cardiac cath by Dr. Nanetta Batty  in 2008 which was completely normal.  He did not even have minimally  obstructive CAD then.  His LVEF was normal as well.  However, he was  monitored in the hospital.  He had 3 sets of cardiac enzymes to rule out  for MI which were negative.  Subsequently, he was seen by GI who also  agreed that his chest pain was most likely from a GI source.  It was  recommended to continue on PPI b.i.d. for 4-6 weeks and follow up in  clinic with them.   DISCHARGE MEDICATIONS:  1. Lisinopril 10 mg once a day.  2. Aspirin 81 mg once a day.  3. Lipitor 20 mg once a day.  4. Prilosec 20 mg p.o. b.i.d. for 4-6 weeks.   DISCHARGE DIET:  Heart-healthy diet.   DISCHARGE ACTIVITY:  As tolerated.   FOLLOW UP:  1. The patient is advised to follow up with GI in 4-6 weeks following      his PPI therapy.  2. Primary care physician in 1 week.      Zannie Cove, MD  Electronically Signed     PJ/MEDQ  D:  09/26/2008  T:  09/26/2008  Job:  [id number]

## 2010-07-19 NOTE — Cardiovascular Report (Signed)
NAME:  DESMUND, ELMAN NO.:  [id number]   MEDICAL RECORD NO.:  [id number]          PATIENT TYPE:  OIB   LOCATION:  2899                         FACILITY:  [facility]   PHYSICIAN:  Nanetta Batty, M.D.   DATE OF BIRTH:  1950/10/15   DATE OF PROCEDURE:  04/27/2006  DATE OF DISCHARGE:                            CARDIAC CATHETERIZATION   Mr. Birdsell is a very pleasant 60 year old married white male who was  referred for chest pain, palpitations, shortness of breath.  He had a  normal stress test and 2-D echocardiogram.  He does have positive risk  factors including hypertension, hyperlipidemia, and family history.  Because of ongoing symptoms, it was elected to perform outpatient  diagnostic coronary arteriography to define his anatomy and rule out  ischemic etiology.   DESCRIPTION OF PROCEDURE:  The patient was brought to the second floor  [facility] cardiac cath lab in the postabsorptive state.  He was  premedicated with p.o. Valium.  His right groin was prepped and shaved  in the usual sterile fashion.  1% Xylocaine was used for local  anesthesia.  A 6-French sheath was inserted into the right femoral  artery using the standard Seldinger technique.  A 6-French right and  left Judkins diagnostic catheter as well as a 6-French pigtail catheter  were used for selective coronary angiography and left ventriculography,  respectively.  Visipaque dye was used for the entirety of the case.  __________ aorta, left ventricular and blood pressures were recorded.   HEMODYNAMIC RESULTS:  1. Aortic systolic pressure 143, diastolic pressure 79.  2. Left ventricular systolic pressure 144, end-diastolic pressure 15.   SELECTIVE CORONARY ANGIOGRAPHY:  1. Left main normal.  2. LAD normal.  3. Left circumflex normal.  4. Right coronary artery was dominant and normal.   LEFT VENTRICULOGRAPHY:  RAO left ventriculogram was performed using 25  mL of Visipaque dye at 12 mL per second.  The  overall LVEF was estimated  at greater than 60% without focal wall motion abnormalities.   IMPRESSION:  Mr. Castilleja has essentially normal coronary arteries and  normal left ventricular function.  I believe his chest pain is  noncardiac.  His  stress test was accurate.  He will be treated  empirically for reflux.  Discharge home later today as an outpatient.  He will see Dr. Dossie Arbour back in followup.  He left the lab in stable  condition.      Nanetta Batty, M.D.  Electronically Signed     JB/MEDQ  D:  04/27/2006  T:  04/27/2006  Job:  [id number]   cc:   Catheterization lab  [facility]  [facility]

## 2010-08-31 IMAGING — CT CT EXTREM LOW W/O CM*R*
1 of 5 series · 2 of 14 positions shown, 3 images · non-contrast
Comparison: Plain films 08/21/2008.

CLINICAL DATA: The course films of the patient 2 days ago.  Known
tibial plateau fracture.

CT RIGHT KNEE WITHOUT CONTRAST
TECHNIQUE: Multidetector CT imaging of the right knee was
performed according to the standard protocol without intravenous
contrast. Multiplanar CT image reconstructions were also generated.

[Series 2: lower ext · axial · 0.97mm/px · z∈[-207,-154]mm · 2 of 64 slices shown, 3 images]
[im 22/64  soft-tissue]
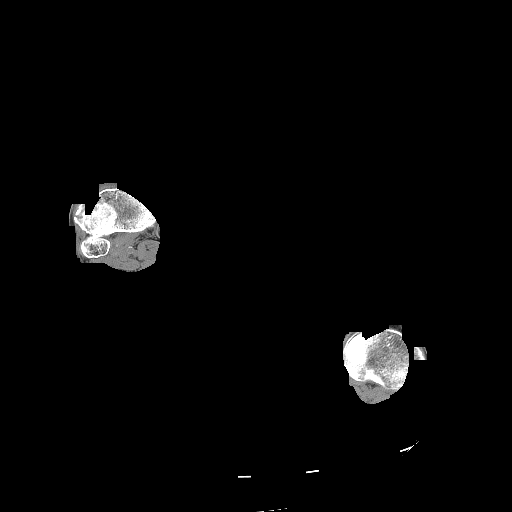
[im 22/64  bone]
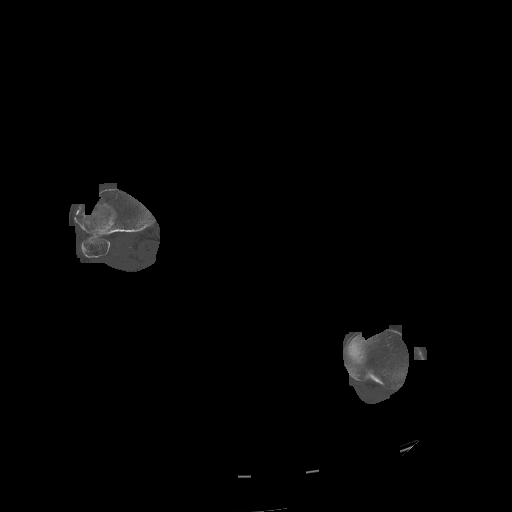
[im 43/64  bone]
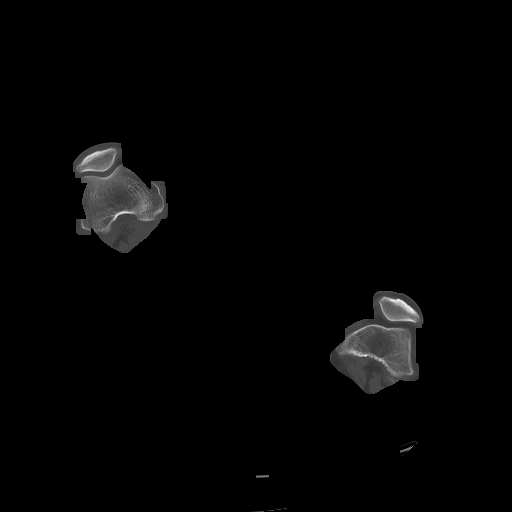

[2 of 14 positions shown; findings below may reference images not displayed]

FINDINGS: The patient has a depressed lateral tibial plateau
fracture.  The lateral tibial plateau is divided into two main
fragments.  Depression of the lateral tibial plateau increases from
medial to lateral.  Medially, compression is minimal in 0.3 cm
bilaterally compression is 1.1 cm.  Aside from depression the
patient's fracture fragment, the patient's fracture extends just
into the lateral tibial eminence.  The lateral tibial plateau is
intact.  The fibula, femur and patella are all intact. Note is made
to small cortical lucency seen in the intercondylar distal femur on
sagittal view 31 of series 104 which are likely a nutrient canals
rather than a fracture.

The patient has a lipohemarthrosis.  As visualized by CT scan, the
cruciate and collateral ligaments are intact.  Extensor mechanism
of the knee is intact.
IMPRESSION: Depressed lateral tibial plateau fracture as detailed above.
Lucencies in the distal femur noted above are likely nutrient
canals rather than fracture.

## 2011-02-07 ENCOUNTER — Ambulatory Visit: Payer: Self-pay | Admitting: Gastroenterology

## 2011-02-12 LAB — PATHOLOGY REPORT

## 2011-10-30 ENCOUNTER — Ambulatory Visit: Payer: Self-pay | Admitting: General Surgery

## 2011-10-30 LAB — CBC WITH DIFFERENTIAL/PLATELET
Basophil #: 0.1 10*3/uL (ref 0.0–0.1)
Basophil %: 1.1 %
Eosinophil #: 0.1 10*3/uL (ref 0.0–0.7)
HCT: 47.5 % (ref 40.0–52.0)
HGB: 16.6 g/dL (ref 13.0–18.0)
MCH: 32.4 pg (ref 26.0–34.0)
MCHC: 35 g/dL (ref 32.0–36.0)
Monocyte #: 0.5 x10 3/mm (ref 0.2–1.0)
Neutrophil #: 5 10*3/uL (ref 1.4–6.5)

## 2011-10-30 LAB — BASIC METABOLIC PANEL
BUN: 25 mg/dL — ABNORMAL HIGH (ref 7–18)
Chloride: 106 mmol/L (ref 98–107)
Creatinine: 1.05 mg/dL (ref 0.60–1.30)
Potassium: 4.3 mmol/L (ref 3.5–5.1)
Sodium: 140 mmol/L (ref 136–145)

## 2011-11-07 ENCOUNTER — Ambulatory Visit: Payer: Self-pay | Admitting: General Surgery

## 2013-12-23 ENCOUNTER — Ambulatory Visit: Payer: Self-pay | Admitting: Ophthalmology

## 2013-12-29 ENCOUNTER — Ambulatory Visit: Payer: Self-pay | Admitting: Ophthalmology

## 2014-03-07 ENCOUNTER — Ambulatory Visit: Payer: Self-pay | Admitting: Ophthalmology

## 2014-06-20 NOTE — Op Note (Signed)
PATIENT NAME:  Neil Bush, Neil Bush MR#:  [id number] DATE OF BIRTH:  12/17/1950  DATE OF PROCEDURE:  11/07/2011  PREOPERATIVE DIAGNOSIS: Right inguinal hernia.   POSTOPERATIVE DIAGNOSIS: Right inguinal hernia.   OPERATIVE PROCEDURE: Right indirect inguinal hernia repair with large Ultrapro mesh.   SURGEON: Hervey Ard, MD   ANESTHESIA: General by LMA, Marcaine 0.5% with 1: 200,000 units epinephrine, 30 mL local infiltration, Toradol 30 mg.   ESTIMATED BLOOD LOSS: Minimal.   CLINICAL NOTE: This 64 year old male has developed a symptomatic right inguinal hernia. He was admitted for elective repair. He received Kefzol prior to the procedure.   OPERATIVE NOTE: With the patient under adequate general anesthesia and hair previously removed with clippers, the area was prepped with ChloraPrep and draped. A field block anesthesia was established and Marcaine was used for this. A 5-cm incision along the course of the inguinal canal was carried down through the skin and subcutaneous tissue with hemostasis achieved by electrocautery. The external oblique was opened in the direction of its fibers. The cord was mobilized and an indirect sac was identified. This was freed circumferentially into the preperitoneal space. Examination of the medial aspect of the inguinal canal showed no evidence of a direct defect. A large Ultrapro mesh was smoothed into position in the preperitoneal space and the external component along the floor of the inguinal canal. This was anchored to the pubic tubercle and then along the inguinal ligament with interrupted 0 Surgilon sutures. A lateral slit was made for cord passage and then closed. The superior and medial aspects were anchored to the transverse abdominis aponeurosis. Toradol was placed into the wound. The external oblique was closed with a running 2-0 Vicryl. Scarpa's fascia was closed with a running 3-0 Vicryl, and the skin closed with a running 4-0 Vicryl subcuticular suture.  Benzoin, Steri-Strips, Telfa, and Tegaderm dressing was applied.     The patient tolerated the procedure well and was taken to the recovery room in stable condition.    ____________________________ Robert Bellow, MD jwb:bjt D: 11/07/2011 14:07:33 ET T: 11/07/2011 18:54:23 ET JOB#: [id number]  cc: Robert Bellow, MD, <Dictator> Guadalupe Maple, MD Aruna Nestler Amedeo Kinsman MD ELECTRONICALLY SIGNED 11/13/2011 15:00

## 2014-06-24 NOTE — Op Note (Signed)
PATIENT NAME:  Neil, Bush MR#:  [id number] DATE OF BIRTH:  [birth year]  DATE OF PROCEDURE:  12/29/2013  PREOPERATIVE DIAGNOSIS:  Nuclear sclerotic cataract of the left eye.   POSTOPERATIVE DIAGNOSIS:  Nuclear sclerotic cataract of the left eye.   OPERATIVE PROCEDURE:  Cataract extraction by phacoemulsification with implant of intraocular lens to left eye.   SURGEON:  Birder Robson, MD.   ANESTHESIA:  1. Managed anesthesia care.  2. Topical tetracaine drops followed by 2% Xylocaine jelly applied in the preoperative holding area.   COMPLICATIONS:  None.   TECHNIQUE:   Stop and chop.   DESCRIPTION OF PROCEDURE:  The patient was examined and consented in the preoperative holding area where the aforementioned topical anesthesia was applied to the left eye and then brought back to the Operating Room where the left eye was prepped and draped in the usual sterile ophthalmic fashion and a lid speculum was placed. A paracentesis was created with the side port blade and the anterior chamber was filled with viscoelastic. A near clear corneal incision was performed with the steel keratome. A continuous curvilinear capsulorrhexis was performed with a cystotome followed by the capsulorrhexis forceps. Hydrodissection and hydrodelineation were carried out with BSS on a blunt cannula. The lens was removed in a stop and chop technique and the remaining cortical material was removed with the irrigation-aspiration handpiece. The capsular bag was inflated with viscoelastic and the Tecnis ZCB00 21.0 diopter lens, serial number [id number] was placed in the capsular bag without complication. The remaining viscoelastic was removed from the eye with the irrigation-aspiration handpiece. The wounds were hydrated. The anterior chamber was flushed with Miostat and the eye was inflated to physiologic pressure. 3:1 dilution of Vigamox was placed in the anterior chamber. The wounds were found to be water tight. The eye was  dressed with Vigamox. The patient was given protective glasses to wear throughout the day and a shield with which to sleep tonight. The patient was also given drops with which to begin a drop regimen today and will follow-up with me in one day.    ____________________________ Livingston Diones. Welden Hausmann, MD wlp:sac D: 12/29/2013 17:09:48 ET T: 12/29/2013 21:41:56 ET JOB#: [id number]  cc: Fortino Haag L. Junell Cullifer, MD, <Dictator> Livingston Diones Brealyn Baril MD ELECTRONICALLY SIGNED 12/30/2013 11:20

## 2014-07-02 NOTE — Op Note (Signed)
PATIENT NAME:  Neil Bush, Neil Bush MR#:  [id number] DATE OF BIRTH:  1950/07/07  DATE OF PROCEDURE:  03/07/2014  LOCATION: [facility]  PREOPERATIVE DIAGNOSIS:  Nuclear sclerotic cataract of the right eye.   POSTOPERATIVE DIAGNOSIS:  Nuclear sclerotic cataract of the right eye.   OPERATIVE PROCEDURE:  Cataract extraction by phacoemulsification with implant of intraocular lens to the right eye.   SURGEON:  Birder Robson, MD.   ANESTHESIA:  1. Managed anesthesia care.  2. Topical tetracaine drops followed by 2% Xylocaine jelly applied in the preoperative holding area.   COMPLICATIONS:  None.   TECHNIQUE:   Stop and chop.  DESCRIPTION OF PROCEDURE:  The patient was examined and consented in the preoperative holding area where the aforementioned topical anesthesia was applied to the right eye and then brought back to the Operating Room where the right eye was prepped and draped in the usual sterile ophthalmic fashion and a lid speculum was placed. A paracentesis was created with the side port blade and the anterior chamber was filled with viscoelastic. A near clear corneal incision was performed with the steel keratome. A continuous curvilinear capsulorrhexis was performed with a cystotome followed by the capsulorrhexis forceps. Hydrodissection and hydrodelineation were carried out with BSS on a blunt cannula. The lens was removed in a stop-and-chop technique and the remaining cortical material was removed with the irrigation-aspiration handpiece. The capsular bag was inflated with viscoelastic and the Tecnis ZCB00 20.0-diopter lens, serial number [id number] was placed in the capsular bag without complication. The remaining viscoelastic was removed from the eye with the irrigation-aspiration handpiece. The wounds were hydrated. The anterior chamber was flushed with Miostat and the eye was inflated to physiologic pressure. 0.1 mL of cefuroxime concentration 10 mg/mL was placed in the anterior chamber. The  wounds were found to be water tight. The eye was dressed with Vigamox.   The patient was given protective glasses to wear throughout the day and a shield with which to sleep tonight. The patient was also given drops with which to begin a drop regimen today and will follow-up with me in 1 day.     ____________________________ Livingston Diones. Eve Rey, MD wlp:MT D: 03/08/2014 07:49:40 ET T: 03/08/2014 12:56:22 ET JOB#: [id number]  cc: Sheriden Archibeque L. Elley Harp, MD, <Dictator> Livingston Diones Batya Citron MD ELECTRONICALLY SIGNED 03/08/2014 15:23

## 2014-10-09 ENCOUNTER — Ambulatory Visit: Payer: 59

## 2014-10-09 ENCOUNTER — Ambulatory Visit (INDEPENDENT_AMBULATORY_CARE_PROVIDER_SITE_OTHER): Payer: 59 | Admitting: Podiatry

## 2014-10-09 ENCOUNTER — Ambulatory Visit (INDEPENDENT_AMBULATORY_CARE_PROVIDER_SITE_OTHER): Payer: 59

## 2014-10-09 ENCOUNTER — Encounter: Payer: Self-pay | Admitting: Podiatry

## 2014-10-09 VITALS — BP 123/88 | HR 71 | Resp 16 | Ht 70.0 in | Wt 185.0 lb

## 2014-10-09 DIAGNOSIS — M778 Other enthesopathies, not elsewhere classified: Secondary | ICD-10-CM

## 2014-10-09 DIAGNOSIS — M7752 Other enthesopathy of left foot: Secondary | ICD-10-CM

## 2014-10-09 DIAGNOSIS — M1990 Unspecified osteoarthritis, unspecified site: Secondary | ICD-10-CM | POA: Diagnosis not present

## 2014-10-09 DIAGNOSIS — M779 Enthesopathy, unspecified: Secondary | ICD-10-CM

## 2014-10-09 DIAGNOSIS — M722 Plantar fascial fibromatosis: Secondary | ICD-10-CM | POA: Diagnosis not present

## 2014-10-09 MED ORDER — MELOXICAM 15 MG PO TABS: 15.0000 mg | ORAL_TABLET | Freq: Every day | ORAL | Status: DC

## 2014-10-09 MED ORDER — METHYLPREDNISOLONE 4 MG PO TBPK: ORAL_TABLET | ORAL | Status: DC

## 2014-10-09 NOTE — Progress Notes (Signed)
   Subjective:    Patient ID: Neil Bush, male    DOB: 1950-11-13, 64 y.o.   MRN: [id number]  HPI  Right plantar heel pain, this has been going on for about two months now . And the left foot the top has been hurting for 6 weeks now. i could be just sitting in the recliner and the top of the foot will just start aching.  Right heel hurts all the time. Hurts worse in the mornings, it hurts with every step i take , would like him to look at my left great toe as well   Review of Systems     Objective:   Physical Exam: I have reviewed his past medical history medications allergies surgery social history review of systems. Pulses are strongly palpable bilateral. Neurologic sensorium is intact for Semmes-Weinstein monofilament. Deep tendon reflexes are intact bilateral and muscle strength is 5 over 5 dorsiflexion plantar flexors and inverters everters all just musculature is intact. Orthopedic evaluation was resolved with distal to the angle full range of motion without crepitation. Pain on palpation medial calcaneal tubercle of the right heel. Palpation dorsal aspect second metatarsal phalangeal joint left foot. Cutaneous evaluation demonstrates supple well-hydrated cutis no erythema edema cellulitis drainage or odor.      Assessment & Plan:  Assessment: Plantar fasciitis right capsulitis left.  Plan: Discussed etiology pathology conservative versus surgical therapies. Started him on a Medrol Dosepak to be followed by meloxicam. Discussed appropriate shoe gear stretching exercises ice therapy and shoe modifications. We discussed appropriate shoe gear stretching exercises ice therapy shoe gear modifications he was dispensed a plantar fascial brace and a night splint I will follow-up with him in 1 month.

## 2014-10-23 ENCOUNTER — Encounter: Payer: Self-pay | Admitting: Family Medicine

## 2014-10-23 ENCOUNTER — Telehealth: Payer: Self-pay

## 2014-10-23 ENCOUNTER — Ambulatory Visit (INDEPENDENT_AMBULATORY_CARE_PROVIDER_SITE_OTHER): Payer: 59 | Admitting: Family Medicine

## 2014-10-23 VITALS — BP 132/72 | HR 80 | Temp 97.7°F | Ht 70.0 in | Wt 186.2 lb

## 2014-10-23 DIAGNOSIS — L259 Unspecified contact dermatitis, unspecified cause: Secondary | ICD-10-CM | POA: Diagnosis not present

## 2014-10-23 DIAGNOSIS — E785 Hyperlipidemia, unspecified: Secondary | ICD-10-CM | POA: Insufficient documentation

## 2014-10-23 DIAGNOSIS — I10 Essential (primary) hypertension: Secondary | ICD-10-CM | POA: Insufficient documentation

## 2014-10-23 MED ORDER — TRIAMCINOLONE ACETONIDE 0.5 % EX OINT: 1.0000 "application " | TOPICAL_OINTMENT | Freq: Two times a day (BID) | CUTANEOUS | Status: DC

## 2014-10-23 NOTE — Progress Notes (Addendum)
BP 132/72 mmHg  Pulse 80  Temp(Src) 97.7 F (36.5 C)  Ht _0  (1.778 m)  Wt 186 lb 3.2 oz (84.46 kg)  BMI 26.72 kg/m2  SpO2 96%   Subjective:    Patient ID: Neil Bush, male    DOB: May 04, 1950, 64 y.o.   MRN: [id number]  HPI: Neil Bush is a 64 y.o. male  Chief Complaint  Patient presents with  . Rash    bilateral elbows   RASH- about 2 weeks Duration:  weeks  Location: arms  Itching: yes Burning: no Redness: yes Oozing: no Scaling: yes Blisters: yes Painful: no Fevers: no Change in detergents/soaps/personal care products: no Recent illness: no Recent travel:no History of same: no Context: worse Alleviating factors: nothing Treatments attempted:hydrocortisone cream and lotion/moisturizer Shortness of breath: no  Throat/tongue swelling: no Myalgias/arthralgias: no  Relevant past medical, surgical, family and social history reviewed and updated as indicated. Interim medical history since our last visit reviewed. Allergies and medications reviewed and updated.  Review of Systems  Constitutional: Negative.   Respiratory: Negative.   Cardiovascular: Negative.   Musculoskeletal: Negative.   Skin: Positive for rash. Negative for color change, pallor and wound.  Psychiatric/Behavioral: Negative.    Per HPI unless specifically indicated above     Objective:    BP 132/72 mmHg  Pulse 80  Temp(Src) 97.7 F (36.5 C)  Ht _1  (1.778 m)  Wt 186 lb 3.2 oz (84.46 kg)  BMI 26.72 kg/m2  SpO2 96%  Wt Readings from Last 3 Encounters:  10/23/14 186 lb 3.2 oz (84.46 kg)  10/09/14 185 lb (83.915 kg)    Physical Exam  Constitutional: He is oriented to person, place, and time. He appears well-developed and well-nourished. No distress.  HENT:  Head: Normocephalic and atraumatic.  Right Ear: Hearing normal.  Left Ear: Hearing normal.  Nose: Nose normal.  Eyes: Conjunctivae and lids are normal. Right eye exhibits no discharge. Left eye exhibits no discharge.  No scleral icterus.  Cardiovascular: Normal rate, regular rhythm and normal heart sounds.  Exam reveals no gallop and no friction rub.   No murmur heard. Pulmonary/Chest: Effort normal and breath sounds normal. No respiratory distress. He has no wheezes. He has no rales. He exhibits no tenderness.  Musculoskeletal: Normal range of motion.  Neurological: He is alert and oriented to person, place, and time.  Skin: Skin is warm, dry and intact. Rash noted. Rash is vesicular. No erythema. No pallor.  Vesicular rash bilateral elbows only no scaling,   Psychiatric: He has a normal mood and affect. His speech is normal and behavior is normal. Judgment and thought content normal. Cognition and memory are normal.    Results for orders placed or performed in visit on 57/84/69  Basic metabolic panel  Result Value Ref Range   Glucose 94 65-99 mg/dL   BUN 25 (H) 7-18 mg/dL   Creatinine 1.05 0.60-1.30 mg/dL   Sodium 140 136-145 mmol/L   Potassium 4.3 3.5-5.1 mmol/L   Chloride 106 98-107 mmol/L   Co2 28 21-32 mmol/L   Calcium, Total 8.5 8.5-10.1 mg/dL   Osmolality 284 275-301   Anion Gap 6 (L) 7-16   EGFR (African American) >60    EGFR (Non-African Amer.) >60   CBC with Differential/Platelet  Result Value Ref Range   WBC 7.7 3.8-10.6 x10 3/mm 3   RBC 5.13 4.40-5.90 x10 6/mm 3   HGB 16.6 13.0-18.0 g/dL   HCT 47.5 40.0-52.0 %   MCV 93 80-100  fL   MCH 32.4 26.0-34.0 pg   MCHC 35.0 32.0-36.0 g/dL   RDW 13.4 11.5-14.5 %   Platelet 236 150-440 x10 3/mm 3   Neutrophil % 64.6 %   Lymphocyte % 25.2 %   Monocyte % 7.2 %   Eosinophil % 1.9 %   Basophil % 1.1 %   Neutrophil # 5.0 1.4-6.5 x10 3/mm 3   Lymphocyte # 1.9 1.0-3.6 x10 3/mm 3   Monocyte # 0.5 0.2-1.0 x10 3/mm    Eosinophil # 0.1 0.0-0.7 x10 3/mm 3   Basophil # 0.1 0.0-0.1 x10 3/mm 3      Assessment & Plan:   Problem List Items Addressed This Visit    None    Visit Diagnoses    Contact dermatitis    -  Primary    Of unknown  cause. Not better with oral prednisone. Will start on triamcinalone. If not better by 2-3 days, will do prednisone taper. Continue to monitor.         Follow up plan: Return if symptoms worsen or fail to improve.

## 2014-10-23 NOTE — Patient Instructions (Signed)
Contact Dermatitis °Contact dermatitis is a rash that happens when something touches the skin. You touched something that irritates your skin, or you have allergies to something you touched. °HOME CARE  °· Avoid the thing that caused your rash. °· Keep your rash away from hot water, soap, sunlight, chemicals, and other things that might bother it. °· Do not scratch your rash. °· You can take cool baths to help stop itching. °· Only take medicine as told by your doctor. °· Keep all doctor visits as told. °GET HELP RIGHT AWAY IF:  °· Your rash is not better after 3 days. °· Your rash gets worse. °· Your rash is puffy (swollen), tender, red, sore, or warm. °· You have problems with your medicine. °MAKE SURE YOU:  °· Understand these instructions. °· Will watch your condition. °· Will get help right away if you are not doing well or get worse. °Document Released: 12/15/2008 Document Revised: 05/12/2011 Document Reviewed: 07/23/2010 °Exit[employer]® Patient Information ©2015 [employer]. This information is not intended to replace advice given to you by your health care provider. Make sure you discuss any questions you have with your health care provider. ° °

## 2014-10-23 NOTE — Telephone Encounter (Signed)
Pt has been added to [facility] schedule for today (10/23/14) @ 1pm for an acute visit. Thanks

## 2014-10-27 ENCOUNTER — Ambulatory Visit (INDEPENDENT_AMBULATORY_CARE_PROVIDER_SITE_OTHER): Payer: 59 | Admitting: Family Medicine

## 2014-10-27 ENCOUNTER — Encounter: Payer: Self-pay | Admitting: Family Medicine

## 2014-10-27 VITALS — BP 147/80 | HR 71 | Temp 98.6°F | Wt 181.8 lb

## 2014-10-27 DIAGNOSIS — L259 Unspecified contact dermatitis, unspecified cause: Secondary | ICD-10-CM

## 2014-10-27 MED ORDER — TRIAMCINOLONE ACETONIDE 0.5 % EX CREA: TOPICAL_CREAM | Freq: Two times a day (BID) | CUTANEOUS | Status: DC

## 2014-10-27 NOTE — Progress Notes (Signed)
BP 147/80 mmHg  Pulse 71  Temp(Src) 98.6 F (37 C)  Wt 181 lb 12.8 oz (82.464 kg)  SpO2 95%   Subjective:    Patient ID: Neil Bush, male    DOB: 1951-01-25, 64 y.o.   MRN: [id number]  HPI: Neil Bush is a 64 y.o. male  Chief Complaint  Patient presents with  . Rash    patient state sthat it is better   RASH- here for a week follow up. Doing much better. Almost resolved, but out of his medicine. Needs a refill. No other concerns or complaints. Feeling well   Relevant past medical, surgical, family and social history reviewed and updated as indicated. Interim medical history since our last visit reviewed. Allergies and medications reviewed and updated.  Review of Systems  Per HPI unless specifically indicated above     Objective:    BP 147/80 mmHg  Pulse 71  Temp(Src) 98.6 F (37 C)  Wt 181 lb 12.8 oz (82.464 kg)  SpO2 95%  Wt Readings from Last 3 Encounters:  10/27/14 181 lb 12.8 oz (82.464 kg)  10/23/14 186 lb 3.2 oz (84.46 kg)  10/09/14 185 lb (83.915 kg)    Physical Exam  Constitutional: He is oriented to person, place, and time. He appears well-developed and well-nourished. No distress.  HENT:  Head: Normocephalic and atraumatic.  Right Ear: Hearing normal.  Left Ear: Hearing normal.  Nose: Nose normal.  Eyes: Conjunctivae and lids are normal. Right eye exhibits no discharge. Left eye exhibits no discharge. No scleral icterus.  Pulmonary/Chest: Effort normal. No respiratory distress.  Musculoskeletal: Normal range of motion.  Neurological: He is alert and oriented to person, place, and time.  Skin: Skin is warm, dry and intact. Rash noted. No erythema. No pallor.  Maculopapular rash on bilateral elbows with patches on both arms. Significantly better than last visit.   Psychiatric: He has a normal mood and affect. His speech is normal and behavior is normal. Judgment and thought content normal. Cognition and memory are normal.  Nursing note and  vitals reviewed.   Results for orders placed or performed in visit on 37/85/88  Basic metabolic panel  Result Value Ref Range   Glucose 94 65-99 mg/dL   BUN 25 (H) 7-18 mg/dL   Creatinine 1.05 0.60-1.30 mg/dL   Sodium 140 136-145 mmol/L   Potassium 4.3 3.5-5.1 mmol/L   Chloride 106 98-107 mmol/L   Co2 28 21-32 mmol/L   Calcium, Total 8.5 8.5-10.1 mg/dL   Osmolality 284 275-301   Anion Gap 6 (L) 7-16   EGFR (African American) >60    EGFR (Non-African Amer.) >60   CBC with Differential/Platelet  Result Value Ref Range   WBC 7.7 3.8-10.6 x10 3/mm 3   RBC 5.13 4.40-5.90 x10 6/mm 3   HGB 16.6 13.0-18.0 g/dL   HCT 47.5 40.0-52.0 %   MCV 93 80-100 fL   MCH 32.4 26.0-34.0 pg   MCHC 35.0 32.0-36.0 g/dL   RDW 13.4 11.5-14.5 %   Platelet 236 150-440 x10 3/mm 3   Neutrophil % 64.6 %   Lymphocyte % 25.2 %   Monocyte % 7.2 %   Eosinophil % 1.9 %   Basophil % 1.1 %   Neutrophil # 5.0 1.4-6.5 x10 3/mm 3   Lymphocyte # 1.9 1.0-3.6 x10 3/mm 3   Monocyte # 0.5 0.2-1.0 x10 3/mm    Eosinophil # 0.1 0.0-0.7 x10 3/mm 3   Basophil # 0.1 0.0-0.1 x10 3/mm 3  Assessment & Plan:   Problem List Items Addressed This Visit    None    Visit Diagnoses    Contact dermatitis    -  Primary    Of unknown cause. Not better with oral prednisone. Significantly better with kenalog cream. Advised against over use. Refill given today.        Follow up plan: Return if symptoms worsen or fail to improve.

## 2014-10-27 NOTE — Patient Instructions (Signed)

## 2014-11-08 ENCOUNTER — Encounter: Payer: Self-pay | Admitting: Podiatry

## 2014-11-08 ENCOUNTER — Ambulatory Visit (INDEPENDENT_AMBULATORY_CARE_PROVIDER_SITE_OTHER): Payer: 59 | Admitting: Podiatry

## 2014-11-08 VITALS — BP 151/82 | HR 76 | Resp 16

## 2014-11-08 DIAGNOSIS — M722 Plantar fascial fibromatosis: Secondary | ICD-10-CM

## 2014-11-08 MED ORDER — DICLOFENAC SODIUM 75 MG PO TBEC: 75.0000 mg | DELAYED_RELEASE_TABLET | Freq: Two times a day (BID) | ORAL | Status: DC

## 2014-11-08 NOTE — Progress Notes (Signed)
He presents today for follow-up of his plantar fasciitis bilateral. He states the left foot is doing much better and the right foot is approximately 50% improved. He continues all his medications and braces.  Objective: Vital signs are stable he is alert and oriented 3 pulses are strongly palpable. Neurologic sensorium is intact versus once the monofilament. Deep tendon reflexes are intact. Orthopedic evaluation demonstrates pain on palpation medial calcaneal tubercle of the right foot.  Assessment: Resolved plantar fasciitis left foot 100% plantar fasciitis resolving 50% right foot.  Plan: Reinjected the right heel today with Kenalog and local and aesthetic. Encouraged him to continue his anti-inflammatory's and his braces and shoe gear changes and I will follow-up with him in 1 month for consideration of orthotics.  Roselind Messier DPM

## 2014-12-11 ENCOUNTER — Ambulatory Visit: Payer: 59 | Admitting: Podiatry

## 2014-12-12 ENCOUNTER — Encounter: Payer: Self-pay | Admitting: Family Medicine

## 2014-12-20 ENCOUNTER — Other Ambulatory Visit: Payer: Self-pay | Admitting: Family Medicine

## 2014-12-26 ENCOUNTER — Ambulatory Visit (INDEPENDENT_AMBULATORY_CARE_PROVIDER_SITE_OTHER): Payer: 59 | Admitting: Family Medicine

## 2014-12-26 ENCOUNTER — Encounter: Payer: Self-pay | Admitting: Family Medicine

## 2014-12-26 VITALS — BP 130/74 | HR 60 | Temp 98.0°F | Ht 68.7 in | Wt 180.0 lb

## 2014-12-26 DIAGNOSIS — Z0000 Encounter for general adult medical examination without abnormal findings: Secondary | ICD-10-CM

## 2014-12-26 DIAGNOSIS — Z23 Encounter for immunization: Secondary | ICD-10-CM

## 2014-12-26 DIAGNOSIS — I10 Essential (primary) hypertension: Secondary | ICD-10-CM

## 2014-12-26 DIAGNOSIS — E785 Hyperlipidemia, unspecified: Secondary | ICD-10-CM | POA: Diagnosis not present

## 2014-12-26 DIAGNOSIS — Q809 Congenital ichthyosis, unspecified: Secondary | ICD-10-CM | POA: Insufficient documentation

## 2014-12-26 DIAGNOSIS — N400 Enlarged prostate without lower urinary tract symptoms: Secondary | ICD-10-CM | POA: Diagnosis not present

## 2014-12-26 LAB — URINALYSIS, ROUTINE W REFLEX MICROSCOPIC
BILIRUBIN UA: NEGATIVE
GLUCOSE, UA: NEGATIVE
LEUKOCYTES UA: NEGATIVE
Nitrite, UA: NEGATIVE
RBC UA: NEGATIVE
Urobilinogen, Ur: 2 mg/dL — ABNORMAL HIGH (ref 0.2–1.0)
pH, UA: 5.5 (ref 5.0–7.5)

## 2014-12-26 LAB — MICROSCOPIC EXAMINATION

## 2014-12-26 MED ORDER — ATORVASTATIN CALCIUM 40 MG PO TABS
40.0000 mg | ORAL_TABLET | Freq: Every day | ORAL | Status: DC
Start: 2014-12-26 — End: 2016-01-21

## 2014-12-26 MED ORDER — METOPROLOL SUCCINATE ER 50 MG PO TB24: 50.0000 mg | ORAL_TABLET | Freq: Every day | ORAL | Status: DC

## 2014-12-26 MED ORDER — LISINOPRIL 10 MG PO TABS: 10.0000 mg | ORAL_TABLET | Freq: Every day | ORAL | Status: DC

## 2014-12-26 MED ORDER — VALACYCLOVIR HCL 1 G PO TABS: 1000.0000 mg | ORAL_TABLET | Freq: Three times a day (TID) | ORAL | Status: DC | PRN

## 2014-12-26 NOTE — Assessment & Plan Note (Signed)
The current medical regimen is effective;  continue present plan and medications.  

## 2014-12-26 NOTE — Assessment & Plan Note (Signed)
Pt uses lotions life long

## 2014-12-26 NOTE — Progress Notes (Signed)
BP 130/74 mmHg  Pulse 60  Temp(Src) 98 F (36.7 C)  Ht 5' 8.7" (1.745 m)  Wt 180 lb (81.647 kg)  BMI 26.81 kg/m2  SpO2 97%   Subjective:    Patient ID: Neil Bush, male    DOB: May 14, 1950, 64 y.o.   MRN: [id number]  HPI: Neil Bush is a 64 y.o. male  Chief Complaint  Patient presents with  . Annual Exam   patient doing well with medications taking without side effects and taking faithfully Cholesterol doing well Blood pressure doing well. Does have some leg cramps sometimes with stretching in the evening or early morning. Not associated with activity.  Relevant past medical, surgical, family and social history reviewed and updated as indicated. Interim medical history since our last visit reviewed. Allergies and medications reviewed and updated.  Review of Systems  Constitutional: Negative.   HENT: Negative.   Eyes: Negative.   Respiratory: Negative.   Cardiovascular: Negative.   Gastrointestinal: Negative.   Endocrine: Negative.   Genitourinary: Negative.   Musculoskeletal: Negative.   Skin: Negative.   Allergic/Immunologic: Negative.   Neurological: Negative.   Hematological: Negative.   Psychiatric/Behavioral: Negative.     Per HPI unless specifically indicated above     Objective:    BP 130/74 mmHg  Pulse 60  Temp(Src) 98 F (36.7 C)  Ht 5' 8.7" (1.745 m)  Wt 180 lb (81.647 kg)  BMI 26.81 kg/m2  SpO2 97%  Wt Readings from Last 3 Encounters:  12/26/14 180 lb (81.647 kg)  10/27/14 181 lb 12.8 oz (82.464 kg)  10/23/14 186 lb 3.2 oz (84.46 kg)    Physical Exam  Constitutional: He is oriented to person, place, and time. He appears well-developed and well-nourished.  HENT:  Head: Normocephalic and atraumatic.  Right Ear: External ear normal.  Left Ear: External ear normal.  Eyes: Conjunctivae and EOM are normal. Pupils are equal, round, and reactive to light.  Neck: Normal range of motion. Neck supple.  Cardiovascular: Normal rate, regular  rhythm, normal heart sounds and intact distal pulses.   Pulmonary/Chest: Effort normal and breath sounds normal.  Abdominal: Soft. Bowel sounds are normal. There is no splenomegaly or hepatomegaly.  Genitourinary: Rectum normal and penis normal.  prostate enlarged  Musculoskeletal: Normal range of motion.  Neurological: He is alert and oriented to person, place, and time. He has normal reflexes.  Skin: No rash noted. No erythema.  Ichthyosis changes  Psychiatric: He has a normal mood and affect. His behavior is normal. Judgment and thought content normal.        Assessment & Plan:   Problem List Items Addressed This Visit      Cardiovascular and Mediastinum   Hypertension    The current medical regimen is effective;  continue present plan and medications.       Relevant Medications   atorvastatin (LIPITOR) 40 MG tablet   lisinopril (PRINIVIL,ZESTRIL) 10 MG tablet   metoprolol succinate (TOPROL-XL) 50 MG 24 hr tablet   Other Relevant Orders   Comprehensive metabolic panel   Lipid panel   CBC with Differential/Platelet   Urinalysis, Routine w reflex microscopic (not at [facility])   TSH     Musculoskeletal and Integument   Ichthyosis    Pt uses lotions life long        Genitourinary   BPH (benign prostatic hyperplasia)   Relevant Orders   PSA     Other   Hyperlipidemia    The current medical regimen  is effective;  continue present plan and medications.       Relevant Medications   atorvastatin (LIPITOR) 40 MG tablet   lisinopril (PRINIVIL,ZESTRIL) 10 MG tablet   metoprolol succinate (TOPROL-XL) 50 MG 24 hr tablet   Other Relevant Orders   Comprehensive metabolic panel   Lipid panel   CBC with Differential/Platelet   Urinalysis, Routine w reflex microscopic (not at [facility])   TSH    Other Visit Diagnoses    Immunization due    -  Primary    Relevant Orders    Flu Vaccine QUAD 36+ mos PF IM (Fluarix & Fluzone Quad PF) (Completed)    Pneumococcal conjugate vaccine  13-valent IM (Completed)    PE (physical exam), annual            Follow up plan: Return if symptoms worsen or fail to improve, for med check BMP, lipids, alt, ast.

## 2014-12-27 ENCOUNTER — Encounter: Payer: Self-pay | Admitting: Family Medicine

## 2014-12-27 LAB — COMPREHENSIVE METABOLIC PANEL
ALK PHOS: 69 IU/L (ref 39–117)
ALT: 33 IU/L (ref 0–44)
AST: 22 IU/L (ref 0–40)
Albumin/Globulin Ratio: 2 (ref 1.1–2.5)
Albumin: 4.3 g/dL (ref 3.6–4.8)
BILIRUBIN TOTAL: 0.6 mg/dL (ref 0.0–1.2)
BUN/Creatinine Ratio: 23 — ABNORMAL HIGH (ref 10–22)
BUN: 27 mg/dL (ref 8–27)
CHLORIDE: 103 mmol/L (ref 97–106)
CO2: 24 mmol/L (ref 18–29)
Calcium: 8.9 mg/dL (ref 8.6–10.2)
Creatinine, Ser: 1.15 mg/dL (ref 0.76–1.27)
GFR calc Af Amer: 76 mL/min/{1.73_m2} (ref >59)
GFR calc non Af Amer: 66 mL/min/{1.73_m2} (ref >59)
GLUCOSE: 83 mg/dL (ref 65–99)
Globulin, Total: 2.2 g/dL (ref 1.5–4.5)
Potassium: 4.6 mmol/L (ref 3.5–5.2)
Sodium: 142 mmol/L (ref 136–144)
Total Protein: 6.5 g/dL (ref 6.0–8.5)

## 2014-12-27 LAB — CBC WITH DIFFERENTIAL/PLATELET
BASOS ABS: 0 10*3/uL (ref 0.0–0.2)
Basos: 0 %
EOS (ABSOLUTE): 0.2 10*3/uL (ref 0.0–0.4)
EOS: 2 %
HEMATOCRIT: 46.8 % (ref 37.5–51.0)
Hemoglobin: 16.2 g/dL (ref 12.6–17.7)
IMMATURE GRANULOCYTES: 0 %
Immature Grans (Abs): 0 10*3/uL (ref 0.0–0.1)
Lymphocytes Absolute: 1.9 10*3/uL (ref 0.7–3.1)
Lymphs: 26 %
MCH: 32.9 pg (ref 26.6–33.0)
MCHC: 34.6 g/dL (ref 31.5–35.7)
MCV: 95 fL (ref 79–97)
MONOS ABS: 0.6 10*3/uL (ref 0.1–0.9)
Monocytes: 8 %
NEUTROS PCT: 64 %
Neutrophils Absolute: 4.8 10*3/uL (ref 1.4–7.0)
Platelets: 251 10*3/uL (ref 150–379)
RBC: 4.92 x10E6/uL (ref 4.14–5.80)
RDW: 14.4 % (ref 12.3–15.4)
WBC: 7.6 10*3/uL (ref 3.4–10.8)

## 2014-12-27 LAB — LIPID PANEL
CHOLESTEROL TOTAL: 191 mg/dL (ref 100–199)
Chol/HDL Ratio: 4.9 ratio units (ref 0.0–5.0)
HDL: 39 mg/dL — AB (ref >39)
LDL Calculated: 120 mg/dL — ABNORMAL HIGH (ref 0–99)
TRIGLYCERIDES: 159 mg/dL — AB (ref 0–149)
VLDL CHOLESTEROL CAL: 32 mg/dL (ref 5–40)

## 2014-12-27 LAB — PSA: PROSTATE SPECIFIC AG, SERUM: 0.9 ng/mL (ref 0.0–4.0)

## 2014-12-27 LAB — TSH: TSH: 1.28 u[IU]/mL (ref 0.450–4.500)

## 2015-02-05 ENCOUNTER — Encounter: Payer: Self-pay | Admitting: Podiatry

## 2015-02-05 ENCOUNTER — Ambulatory Visit (INDEPENDENT_AMBULATORY_CARE_PROVIDER_SITE_OTHER): Payer: 59 | Admitting: Podiatry

## 2015-02-05 VITALS — BP 155/98 | HR 66 | Resp 16

## 2015-02-05 DIAGNOSIS — M779 Enthesopathy, unspecified: Secondary | ICD-10-CM

## 2015-02-05 DIAGNOSIS — M722 Plantar fascial fibromatosis: Secondary | ICD-10-CM

## 2015-02-05 DIAGNOSIS — M7752 Other enthesopathy of left foot: Secondary | ICD-10-CM

## 2015-02-05 DIAGNOSIS — M778 Other enthesopathies, not elsewhere classified: Secondary | ICD-10-CM

## 2015-02-05 NOTE — Progress Notes (Signed)
He presents today for follow-up of plantar fasciitis left heel and capsulitis medial aspect and dorsal medial aspect near the posterior tibial tendon insertion right foot. He states it is hurting significantly again and would like to consider injections and orthotics.  Objective: Vital signs are stable alert and oriented 3 pulses are strongly palpable. Pain on palpation of the insertion site of the posterior tibial tendon and the tibialis anterior tendon right foot. Severe pain palpation medial calcaneal tubercle of the left heel.  Assessment: Capsulitis midfoot right plantar fasciitis left.  Plan: Discussed etiology pathology concerned over surgical therapies. At this point I encouraged him to have a pair of orthotics made he was scanned for these today. I also injected dorsomedial aspect with Kenalog and local anesthetic right foot and to the heel of the left foot. He will continue all other conservative therapy such as splints and anti-inflammatories. Follow up with him once his orthotics come in.  Roselind Messier DPM

## 2015-02-22 ENCOUNTER — Ambulatory Visit: Payer: 59 | Admitting: *Deleted

## 2015-02-22 DIAGNOSIS — M722 Plantar fascial fibromatosis: Secondary | ICD-10-CM

## 2015-02-22 NOTE — Patient Instructions (Signed)

## 2015-02-22 NOTE — Progress Notes (Signed)
Patient ID: Neil Bush, male   DOB: 24-Dec-1950, 64 y.o.   MRN: [id number] Patient presents for orthotic pick up.  Verbal and written break in and wear instructions given.  Patient will follow up in 4 weeks if symptoms worsen or fail to improve.

## 2015-04-09 ENCOUNTER — Encounter: Payer: Self-pay | Admitting: Podiatry

## 2015-04-09 ENCOUNTER — Ambulatory Visit (INDEPENDENT_AMBULATORY_CARE_PROVIDER_SITE_OTHER): Payer: 59 | Admitting: Podiatry

## 2015-04-09 VITALS — BP 143/96 | HR 80 | Resp 12

## 2015-04-09 DIAGNOSIS — M722 Plantar fascial fibromatosis: Secondary | ICD-10-CM | POA: Diagnosis not present

## 2015-04-09 NOTE — Progress Notes (Signed)
He states that his right foot may be feeling a little bit better after wearing the orthotics but is still exquisitely painful on the right heel. He continues to use of his diclofenac.  Objective: Vital signs are stable he is alert and oriented 3. Pulses are strongly palpable. Severe pain on palpation medial calcaneal tubercle of the right heel. Evaluated the orthotic to make sure that it was in good condition and it is.  Assessment: Chronic intractable plantar fasciitis right.  Plan: I injected the right heel once again today with Celestone and dexamethasone and he will continue the use of his orthotic. Follow up with him in 1 month if necessary.

## 2015-05-21 ENCOUNTER — Telehealth: Payer: Self-pay | Admitting: *Deleted

## 2015-05-21 MED ORDER — DICLOFENAC SODIUM 75 MG PO TBEC: 75.0000 mg | DELAYED_RELEASE_TABLET | Freq: Two times a day (BID) | ORAL | Status: DC

## 2015-05-21 NOTE — Telephone Encounter (Signed)
Pt request refill of Diclofenac to get to April appt.  Dr. Milinda Pointer refilled one time to get to the April appt.

## 2015-05-28 ENCOUNTER — Telehealth: Payer: Self-pay | Admitting: *Deleted

## 2015-05-28 ENCOUNTER — Encounter: Payer: Self-pay | Admitting: Podiatry

## 2015-05-28 ENCOUNTER — Ambulatory Visit (INDEPENDENT_AMBULATORY_CARE_PROVIDER_SITE_OTHER): Payer: 59 | Admitting: Podiatry

## 2015-05-28 VITALS — BP 152/80 | HR 74 | Resp 16

## 2015-05-28 DIAGNOSIS — M722 Plantar fascial fibromatosis: Secondary | ICD-10-CM | POA: Diagnosis not present

## 2015-05-28 NOTE — Telephone Encounter (Signed)
Please call me

## 2015-05-28 NOTE — Progress Notes (Signed)
He presents today for follow-up of heel pain right foot. He states that it seems just be getting to a stopping point and is not progressively getting any better.  Objective: Vital signs are stable alert and oriented 3 and reviewed his past medical history medications allergies surgeries and social history. Pulses are strongly palpable. Neurologic sensorium is intact per Semmes-Weinstein monofilament. Deep tendon reflexes are intact. Pain on palpation medial calcaneal tubercle of the right heel.  Assessment: Plantar fasciitis chronic in nature right foot.  Plan: At this point we went over a consent form today line by line number by number giving him ample time to ask questions he saw fit regarding an endoscopic plantar fasciotomy of the right foot. I answered all questions regarding this procedure to the best of my ability in layman's terms. He understood it was amenable to it and signed all 3 cages of the consent form. I will follow-up with him in the near future for surgery.

## 2015-06-05 ENCOUNTER — Telehealth: Payer: Self-pay | Admitting: *Deleted

## 2015-06-05 NOTE — Telephone Encounter (Signed)
"  I missed your call a while ago."

## 2015-06-05 NOTE — Telephone Encounter (Signed)
I'm returning your call.  When would you like to schedule surgery?  "I'd like to do it the first week of May."  He can do it on May 5.  "That will be fine.  Is there anything else I need to do?"  You will need to register with the surgical center, instructions are in the [facility]'s brochure.  They will call you a day or two prior to surgery date with the arrival time.  "Okay,great!"

## 2015-06-05 NOTE — Telephone Encounter (Signed)
I attempted to return his call.  I left a message for him to call me back.

## 2015-06-18 ENCOUNTER — Ambulatory Visit: Payer: 59 | Admitting: Podiatry

## 2015-06-25 ENCOUNTER — Telehealth: Payer: Self-pay | Admitting: *Deleted

## 2015-06-25 NOTE — Telephone Encounter (Signed)
dont forget to cancel the follow up appts.

## 2015-06-25 NOTE — Telephone Encounter (Signed)
Levada Dy informed me that you want to cancel your surgery.  "Yes, I just want to postpone it."  Would you like to reschedule it now or call us back?  "I'll call you back later.  He wants to cut the tendon in my foot for the heel pain.  My foot is hurting more from the Arthritis than it is in the heel.  So, I want to work on that first."  Okay, I'll let Dr. Milinda Pointer know and cancel your surgery with the surgical center.  "Thank you so much."  I called and left a message for Caren Griffins at [facility] to cancel surgery for 07/06/2015.

## 2015-06-26 ENCOUNTER — Encounter: Payer: Self-pay | Admitting: Family Medicine

## 2015-06-26 ENCOUNTER — Ambulatory Visit (INDEPENDENT_AMBULATORY_CARE_PROVIDER_SITE_OTHER): Payer: 59 | Admitting: Family Medicine

## 2015-06-26 VITALS — BP 138/81 | HR 64 | Temp 98.1°F | Ht 68.8 in | Wt 182.0 lb

## 2015-06-26 DIAGNOSIS — E785 Hyperlipidemia, unspecified: Secondary | ICD-10-CM | POA: Diagnosis not present

## 2015-06-26 DIAGNOSIS — I10 Essential (primary) hypertension: Secondary | ICD-10-CM | POA: Diagnosis not present

## 2015-06-26 LAB — LP+ALT+AST PICCOLO, WAIVED
ALT (SGPT) Piccolo, Waived: 30 U/L (ref 10–47)
AST (SGOT) Piccolo, Waived: 26 U/L (ref 11–38)
CHOL/HDL RATIO PICCOLO,WAIVE: 4.9 mg/dL
CHOLESTEROL PICCOLO, WAIVED: 181 mg/dL (ref <200)
HDL Chol Piccolo, Waived: 37 mg/dL — ABNORMAL LOW (ref >59)
LDL CHOL CALC PICCOLO WAIVED: 104 mg/dL — AB (ref <100)
Triglycerides Piccolo,Waived: 200 mg/dL — ABNORMAL HIGH (ref <150)
VLDL CHOL CALC PICCOLO,WAIVE: 40 mg/dL — AB (ref <30)

## 2015-06-26 MED ORDER — MELOXICAM 15 MG PO TABS: 15.0000 mg | ORAL_TABLET | Freq: Every day | ORAL | Status: DC

## 2015-06-26 NOTE — Progress Notes (Signed)
BP 138/81 mmHg  Pulse 64  Temp(Src) 98.1 F (36.7 C)  Ht 5' 8.8" (1.748 m)  Wt 182 lb (82.555 kg)  BMI 27.02 kg/m2  SpO2 98%   Subjective:    Patient ID: Neil Bush, male    DOB: [birth year], 65 y.o.   MRN: [id number]  HPI: Neil Bush is a 65 y.o. male  Chief Complaint  Patient presents with  . Hyperlipidemia  . Hypertension  Patient recheck blood pressure which is been doing well no complaints from medications Cholesterol doing well taking Lipitor 40 without problems Discussed ocular migraines with patient last about 20 minutes discuss using metoprolol for prophylaxis for migraines Also discussed plantar fasciitis care treatment and okay to use meloxicam  Relevant past medical, surgical, family and social history reviewed and updated as indicated. Interim medical history since our last visit reviewed. Allergies and medications reviewed and updated.  Review of Systems  Constitutional: Negative.   Respiratory: Negative.   Cardiovascular: Negative.     Per HPI unless specifically indicated above     Objective:    BP 138/81 mmHg  Pulse 64  Temp(Src) 98.1 F (36.7 C)  Ht 5' 8.8" (1.748 m)  Wt 182 lb (82.555 kg)  BMI 27.02 kg/m2  SpO2 98%  Wt Readings from Last 3 Encounters:  06/26/15 182 lb (82.555 kg)  12/26/14 180 lb (81.647 kg)  10/27/14 181 lb 12.8 oz (82.464 kg)    Physical Exam  Constitutional: He is oriented to person, place, and time. He appears well-developed and well-nourished. No distress.  HENT:  Head: Normocephalic and atraumatic.  Right Ear: Hearing normal.  Left Ear: Hearing normal.  Nose: Nose normal.  Eyes: Conjunctivae and lids are normal. Right eye exhibits no discharge. Left eye exhibits no discharge. No scleral icterus.  Cardiovascular: Normal rate, regular rhythm and normal heart sounds.   Pulmonary/Chest: Effort normal and breath sounds normal. No respiratory distress.  Musculoskeletal: Normal range of motion.  Neurological: He  is alert and oriented to person, place, and time.  Skin: Skin is intact. No rash noted.  Psychiatric: He has a normal mood and affect. His speech is normal and behavior is normal. Judgment and thought content normal. Cognition and memory are normal.    Results for orders placed or performed in visit on 12/26/14  Microscopic Examination  Result Value Ref Range   WBC, UA 0-5 0 -  5 /hpf   RBC, UA 0-2 0 -  2 /hpf   Epithelial Cells (non renal) 0-10 0 - 10 /hpf   Mucus, UA Present Not Estab.   Bacteria, UA Few None seen/Few  Comprehensive metabolic panel  Result Value Ref Range   Glucose 83 65 - 99 mg/dL   BUN 27 8 - 27 mg/dL   Creatinine, Ser 1.15 0.76 - 1.27 mg/dL   GFR calc non Af Amer 66 >59 mL/min/1.73   GFR calc Af Amer 76 >59 mL/min/1.73   BUN/Creatinine Ratio 23 (H) 10 - 22   Sodium 142 136 - 144 mmol/L   Potassium 4.6 3.5 - 5.2 mmol/L   Chloride 103 97 - 106 mmol/L   CO2 24 18 - 29 mmol/L   Calcium 8.9 8.6 - 10.2 mg/dL   Total Protein 6.5 6.0 - 8.5 g/dL   Albumin 4.3 3.6 - 4.8 g/dL   Globulin, Total 2.2 1.5 - 4.5 g/dL   Albumin/Globulin Ratio 2.0 1.1 - 2.5   Bilirubin Total 0.6 0.0 - 1.2 mg/dL   Alkaline Phosphatase  69 39 - 117 IU/L   AST 22 0 - 40 IU/L   ALT 33 0 - 44 IU/L  Lipid panel  Result Value Ref Range   Cholesterol, Total 191 100 - 199 mg/dL   Triglycerides 159 (H) 0 - 149 mg/dL   HDL 39 (L) >39 mg/dL   VLDL Cholesterol Cal 32 5 - 40 mg/dL   LDL Calculated 120 (H) 0 - 99 mg/dL   Chol/HDL Ratio 4.9 0.0 - 5.0 ratio units  CBC with Differential/Platelet  Result Value Ref Range   WBC 7.6 3.4 - 10.8 x10E3/uL   RBC 4.92 4.14 - 5.80 x10E6/uL   Hemoglobin 16.2 12.6 - 17.7 g/dL   Hematocrit 46.8 37.5 - 51.0 %   MCV 95 79 - 97 fL   MCH 32.9 26.6 - 33.0 pg   MCHC 34.6 31.5 - 35.7 g/dL   RDW 14.4 12.3 - 15.4 %   Platelets 251 150 - 379 x10E3/uL   Neutrophils 64 %   Lymphs 26 %   Monocytes 8 %   Eos 2 %   Basos 0 %   Neutrophils Absolute 4.8 1.4 - 7.0  x10E3/uL   Lymphocytes Absolute 1.9 0.7 - 3.1 x10E3/uL   Monocytes Absolute 0.6 0.1 - 0.9 x10E3/uL   EOS (ABSOLUTE) 0.2 0.0 - 0.4 x10E3/uL   Basophils Absolute 0.0 0.0 - 0.2 x10E3/uL   Immature Granulocytes 0 %   Immature Grans (Abs) 0.0 0.0 - 0.1 x10E3/uL  PSA  Result Value Ref Range   Prostate Specific Ag, Serum 0.9 0.0 - 4.0 ng/mL  Urinalysis, Routine w reflex microscopic (not at [facility])  Result Value Ref Range   Specific Gravity, UA >1.030 (H) 1.005 - 1.030   pH, UA 5.5 5.0 - 7.5   Color, UA Yellow Yellow   Appearance Ur Cloudy (A) Clear   Leukocytes, UA Negative Negative   Protein, UA 1+ (A) Negative/Trace   Glucose, UA Negative Negative   Ketones, UA Trace (A) Negative   RBC, UA Negative Negative   Bilirubin, UA Negative Negative   Urobilinogen, Ur 2.0 (H) 0.2 - 1.0 mg/dL   Nitrite, UA Negative Negative   Microscopic Examination See below:   TSH  Result Value Ref Range   TSH 1.280 0.450 - 4.500 uIU/mL      Assessment & Plan:   Problem List Items Addressed This Visit      Cardiovascular and Mediastinum   Hypertension    The current medical regimen is effective;  continue present plan and medications.       Relevant Orders   LP+ALT+AST Piccolo, Waived   Basic metabolic panel     Other   Hyperlipidemia - Primary    The current medical regimen is effective;  continue present plan and medications.       Relevant Orders   LP+ALT+AST Piccolo, Waived   Basic metabolic panel       Follow up plan: Return in about 6 months (around 12/26/2015), or if symptoms worsen or fail to improve, for Physical Exam.

## 2015-06-26 NOTE — Assessment & Plan Note (Signed)
The current medical regimen is effective;  continue present plan and medications.  

## 2015-06-27 ENCOUNTER — Encounter: Payer: Self-pay | Admitting: Family Medicine

## 2015-06-27 LAB — BASIC METABOLIC PANEL
BUN/Creatinine Ratio: 22 (ref 10–24)
BUN: 22 mg/dL (ref 8–27)
CALCIUM: 9 mg/dL (ref 8.6–10.2)
CHLORIDE: 104 mmol/L (ref 96–106)
CO2: 21 mmol/L (ref 18–29)
Creatinine, Ser: 1 mg/dL (ref 0.76–1.27)
GFR calc Af Amer: 90 mL/min/{1.73_m2} (ref >59)
GFR calc non Af Amer: 78 mL/min/{1.73_m2} (ref >59)
GLUCOSE: 97 mg/dL (ref 65–99)
POTASSIUM: 4.5 mmol/L (ref 3.5–5.2)
Sodium: 141 mmol/L (ref 134–144)

## 2015-07-11 ENCOUNTER — Encounter: Payer: Self-pay | Admitting: Podiatry

## 2015-07-18 ENCOUNTER — Encounter: Payer: Self-pay | Admitting: Podiatry

## 2015-08-08 ENCOUNTER — Encounter: Payer: Self-pay | Admitting: Podiatry

## 2015-08-08 ENCOUNTER — Ambulatory Visit (INDEPENDENT_AMBULATORY_CARE_PROVIDER_SITE_OTHER): Payer: 59 | Admitting: Podiatry

## 2015-08-08 VITALS — BP 143/77 | HR 60 | Resp 18

## 2015-08-08 DIAGNOSIS — M722 Plantar fascial fibromatosis: Secondary | ICD-10-CM | POA: Diagnosis not present

## 2015-08-08 NOTE — Progress Notes (Signed)
He presents today for follow-up visit plantar fasciitis right foot. He was supposed to have an endoscopic fasciotomy performed back in May but he has to work. So he would like to go ahead and reschedule his surgery today.  Objective: No changes on physical exam pulses remain palpable pain on palpation medially located to the right heel.  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 Posterior tibial tendinitis insertional in nature left midfoot.  Plan: We reviewed his surgical consent today for endoscopic plantar fasciotomy right foot. We will turn this over to our scheduler and he'll be scheduled for surgery in the near future.

## 2015-08-16 ENCOUNTER — Telehealth: Payer: Self-pay | Admitting: *Deleted

## 2015-08-16 NOTE — Telephone Encounter (Signed)
"  Please call me back."

## 2015-08-16 NOTE — Telephone Encounter (Signed)
I attempted to return his call.  I left him a message to call me back. 

## 2015-08-16 NOTE — Telephone Encounter (Signed)
"  I am returning your call after you returned my call."  Yes, when would you like to schedule surgery?  "I'd like the soonest appointment that you have available."  He can do it June 30.  "Is he going to be there the week of the Fourth?"  Yes, we are open that week.  "Can he do it on July 7?"  Yes, he can do it then.  "Great that's better.  Put me down for July 7."  I will, you can go ahead and register with the surgical center on-line.

## 2015-08-16 NOTE — Telephone Encounter (Signed)
-----   Message from Rip Harbour, [facility] sent at 08/08/2015  9:20 AM EDT ----- Regarding: Resch surgery Dr. Milinda Pointer said patient was scheduled for surgery at one time but had to cancel. Can you call him and get him rescheduled? Thanks!

## 2015-09-06 ENCOUNTER — Other Ambulatory Visit: Payer: Self-pay | Admitting: Podiatry

## 2015-09-06 MED ORDER — OXYCODONE-ACETAMINOPHEN 10-325 MG PO TABS: 1.0000 | ORAL_TABLET | Freq: Four times a day (QID) | ORAL | Status: DC | PRN

## 2015-09-06 MED ORDER — CLINDAMYCIN HCL 150 MG PO CAPS: 150.0000 mg | ORAL_CAPSULE | Freq: Three times a day (TID) | ORAL | Status: DC

## 2015-09-06 MED ORDER — PROMETHAZINE HCL 25 MG PO TABS: 25.0000 mg | ORAL_TABLET | Freq: Three times a day (TID) | ORAL | Status: DC | PRN

## 2015-09-07 ENCOUNTER — Encounter: Payer: Self-pay | Admitting: Podiatry

## 2015-09-07 DIAGNOSIS — M722 Plantar fascial fibromatosis: Secondary | ICD-10-CM | POA: Diagnosis not present

## 2015-09-10 ENCOUNTER — Telehealth: Payer: Self-pay | Admitting: *Deleted

## 2015-09-10 NOTE — Progress Notes (Signed)
DOS 09/07/2015 Endoscopic Plantar Fasciotomy right foot.

## 2015-09-10 NOTE — Telephone Encounter (Signed)
Post op courtesy call-Pt states he's doing fine.  I told pt not to be up on the surgery foot or dangling more than 15 mins/hour, remain in the boot and original dressing at all times, and call with concerns.  Pt states understanding.

## 2015-09-14 ENCOUNTER — Ambulatory Visit (INDEPENDENT_AMBULATORY_CARE_PROVIDER_SITE_OTHER): Payer: 59

## 2015-09-14 ENCOUNTER — Ambulatory Visit (INDEPENDENT_AMBULATORY_CARE_PROVIDER_SITE_OTHER): Payer: 59 | Admitting: Sports Medicine

## 2015-09-14 ENCOUNTER — Encounter: Payer: Self-pay | Admitting: Sports Medicine

## 2015-09-14 DIAGNOSIS — M722 Plantar fascial fibromatosis: Secondary | ICD-10-CM | POA: Diagnosis not present

## 2015-09-14 DIAGNOSIS — Z9889 Other specified postprocedural states: Secondary | ICD-10-CM

## 2015-09-14 DIAGNOSIS — M79671 Pain in right foot: Secondary | ICD-10-CM

## 2015-09-14 NOTE — Progress Notes (Signed)
Patient ID: Neil Bush, male   DOB: [birth year], 65 y.o.   MRN: [id number] Subjective: Neil Bush is a 65 y.o. male patient seen today in office for POV #1(DOS 09-07-15), S/P R EPF Patient denies pain at surgical site, denies calf pain, denies headache, chest pain, shortness of breath, nausea, vomiting, fever, or chills. Patient states that he is doing well and is only taking Aleve as needed. No other issues noted.   Patient Active Problem List   Diagnosis Date Noted  . 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 BPH (benign prostatic hyperplasia) 12/26/2014  . Hyperlipidemia   . Hypertension     Current Outpatient Prescriptions on File Prior to Visit  Medication Sig Dispense Refill  . aspirin 81 MG tablet Take 81 mg by mouth daily.    Marland Kitchen atorvastatin (LIPITOR) 40 MG tablet Take 1 tablet (40 mg total) by mouth daily at 6 PM. (Patient taking differently: Take 20 mg by mouth daily at 6 PM. ) 90 tablet 4  . clindamycin (CLEOCIN) 150 MG capsule Take 1 capsule (150 mg total) by mouth 3 (three) times daily. 30 capsule 0  . lisinopril (PRINIVIL,ZESTRIL) 10 MG tablet Take 1 tablet (10 mg total) by mouth daily. 90 tablet 4  . meloxicam (MOBIC) 15 MG tablet Take 1 tablet (15 mg total) by mouth daily. 30 tablet 3  . metoprolol succinate (TOPROL-XL) 50 MG 24 hr tablet Take 1 tablet (50 mg total) by mouth daily. Take with or immediately following a meal. 90 tablet 4  . oxyCODONE-acetaminophen (PERCOCET) 10-325 MG tablet Take 1 tablet by mouth every 6 (six) hours as needed for pain. 30 tablet 0  . promethazine (PHENERGAN) 25 MG tablet Take 1 tablet (25 mg total) by mouth every 8 (eight) hours as needed for nausea or vomiting. 20 tablet 0  . valACYclovir (VALTREX) 1000 MG tablet Take 1 tablet (1,000 mg total) by mouth 3 (three) times daily as needed. 30 tablet 12   No current facility-administered medications on file prior to visit.    Allergies  Allergen Reactions  . Penicillins Rash    Objective: There were no  vitals filed for this visit.  General: No acute distress, AAOx3  Right foot: Sutures intact with no gapping or dehiscence at surgical site at medial and lateral heel, mild swelling to right heel with mild ecchymosis, no erythema, no warmth, no drainage, no signs of infection noted, Capillary fill time <3 seconds in all digits, gross sensation present via light touch to right foot. No pain or crepitation with range of motion right foot. Minimal tenderness to palpation right heel at surgical site.  No pain with calf compression.   Post Op Xray, Right foot: No acute concerns. Pes cavus foot type and unchanged calcaneal spur. Soft tissue swelling within normal limits for post op status.   Assessment and Plan:  Problem List Items Addressed This Visit    None    Visit Diagnoses    Plantar fasciitis, right     -  Primary    Relevant Orders    DG Foot Complete Right    Status post right foot surgery        EPF 09-07-15    Right foot pain           -Patient seen and evaluated -Xrays reviewed -Applied dry sterile dressing to surgical site right foot secured with ACE wrap and stockinet  -Advised patient to make sure to keep dressings clean, dry, and intact to right surgical site,  removing the ACE as needed  -Advised patient to continue with CAM boot to right foot   -Advised patient to limit activity to necessity  -Advised patient to ice and elevate as necessary  -Continue with antibiotics, pain meds, and PRN meds -Patient to follow up with Dr. Milinda Pointer for possible suture removal at next office visit. In the meantime, patient to call office if any issues or problems arise.   Landis Martins, DPM

## 2015-09-19 ENCOUNTER — Encounter: Payer: Self-pay | Admitting: Podiatry

## 2015-09-19 ENCOUNTER — Ambulatory Visit (INDEPENDENT_AMBULATORY_CARE_PROVIDER_SITE_OTHER): Payer: 59 | Admitting: Podiatry

## 2015-09-19 ENCOUNTER — Encounter (INDEPENDENT_AMBULATORY_CARE_PROVIDER_SITE_OTHER): Payer: Self-pay

## 2015-09-19 DIAGNOSIS — Z9889 Other specified postprocedural states: Secondary | ICD-10-CM | POA: Diagnosis not present

## 2015-09-19 DIAGNOSIS — M722 Plantar fascial fibromatosis: Secondary | ICD-10-CM | POA: Diagnosis not present

## 2015-09-19 NOTE — Progress Notes (Signed)
He presents today 2 weeks status post endoscopic plantar fasciotomy. He states that he seems to be doing very well as he refers to the right foot. He denies fevers chills nausea vomiting muscle aches and pains shortness of breath or chest pain.  Objective: Sutures are intact margins well coapted there is no erythema edema saline drainage or odor pulses remain palpable. No calf pain.  Assessment: Well-healing surgical foot.  Plan: Removal of the sutures today. I encouraged him to continue the use of the night splint 1 month he's to get back into his tennis shoe. Anytime he is not in the tennis she needs to be in his night splint. Follow up with him in 2 weeks

## 2015-10-08 ENCOUNTER — Encounter: Payer: Self-pay | Admitting: Podiatry

## 2015-10-08 ENCOUNTER — Ambulatory Visit (INDEPENDENT_AMBULATORY_CARE_PROVIDER_SITE_OTHER): Payer: 59 | Admitting: Podiatry

## 2015-10-08 VITALS — BP 146/89 | HR 72 | Resp 12

## 2015-10-08 DIAGNOSIS — Z9889 Other specified postprocedural states: Secondary | ICD-10-CM

## 2015-10-08 DIAGNOSIS — M722 Plantar fascial fibromatosis: Secondary | ICD-10-CM

## 2015-10-08 NOTE — Progress Notes (Signed)
He is 1 month status post EPF right foot. He states that he has some soreness in the heel but the majority of his pain is around the fourth and fifth metatarsal head of the right foot.  Objective: Vital signs are stable he is alert and oriented 3. Pulses are palpable. Marginal tenderness on palpation medially usual the right heel he does have tenderness on palpation of the fourth and fifth metatarsal of the right foot.  Assessment: Lateral compensatory syndrome right foot.  Plan: Slowly resolving endoscopic plantar fasciotomy date of surgery was 09/07/2015.

## 2015-11-19 ENCOUNTER — Ambulatory Visit (INDEPENDENT_AMBULATORY_CARE_PROVIDER_SITE_OTHER): Payer: PPO | Admitting: Podiatry

## 2015-11-19 DIAGNOSIS — M722 Plantar fascial fibromatosis: Secondary | ICD-10-CM

## 2015-11-19 DIAGNOSIS — Z9889 Other specified postprocedural states: Secondary | ICD-10-CM

## 2015-11-19 MED ORDER — MELOXICAM 15 MG PO TABS: 15.0000 mg | ORAL_TABLET | Freq: Every day | ORAL | 3 refills | Status: DC

## 2015-11-19 NOTE — Progress Notes (Signed)
He presents today for follow-up of his endoscopic plantar fasciotomy of the right foot. He states these been having some pain but the majority of the pain is not in the heel but rather the fourth and fifth metatarsal area. He is also concerned about pain to the left foot along the navicular tuberosity.  Objective: Vital signs are stable he is alert and oriented 3. Pulses are palpable. No reproducible pain either lower extremity.  Assessment: Lateral compensatory syndrome status post EPF right foot. History of posterior tibial tendinitis with pain to the navicular.  Plan: Start him on meloxicam 15 mg 1 by mouth daily follow-up with him in 1 month.

## 2015-11-20 ENCOUNTER — Ambulatory Visit (INDEPENDENT_AMBULATORY_CARE_PROVIDER_SITE_OTHER): Payer: PPO | Admitting: Family Medicine

## 2015-11-20 ENCOUNTER — Encounter: Payer: Self-pay | Admitting: Family Medicine

## 2015-11-20 VITALS — BP 134/73 | HR 59 | Temp 98.0°F | Ht 68.0 in | Wt 164.4 lb

## 2015-11-20 DIAGNOSIS — E860 Dehydration: Secondary | ICD-10-CM

## 2015-11-20 DIAGNOSIS — R55 Syncope and collapse: Secondary | ICD-10-CM

## 2015-11-20 NOTE — Progress Notes (Signed)
BP 134/73 (BP Location: Right Arm, Cuff Size: Normal)   Pulse (!) 59   Temp 98 F (36.7 C)   Ht 5\' 8"  (1.727 m)   Wt 164 lb 6.4 oz (74.6 kg) Comment: with boots  SpO2 98%   BMI 25.00 kg/m    Subjective:    Patient ID: Neil Bush, male    DOB: [birth year], 65 y.o.   MRN: [id number]  HPI: Neil Bush is a 65 y.o. male  Chief Complaint  Patient presents with  . Dizziness  Patient with 2 episodes lasting about 10 minutes each of feeling real draggy tired fatigue then got hot sweaty with rapid heartbeat. Checks his blood pressure subsequently and it was about 160/90 both times and pulse was elevated. Has otherwise been doing well with no complaints or issues No chest pain chest tightness PND orthopnea distal edema  Relevant past medical, surgical, family and social history reviewed and updated as indicated. Interim medical history since our last visit reviewed. Allergies and medications reviewed and updated.  Review of Systems  Constitutional: Negative.   Respiratory: Negative.     Per HPI unless specifically indicated above     Objective:    BP 134/73 (BP Location: Right Arm, Cuff Size: Normal)   Pulse (!) 59   Temp 98 F (36.7 C)   Ht 5\' 8"  (1.727 m)   Wt 164 lb 6.4 oz (74.6 kg) Comment: with boots  SpO2 98%   BMI 25.00 kg/m   Wt Readings from Last 3 Encounters:  11/20/15 164 lb 6.4 oz (74.6 kg)  06/26/15 182 lb (82.6 kg)  12/26/14 180 lb (81.6 kg)    Physical Exam  Constitutional: He is oriented to person, place, and time. He appears well-developed and well-nourished. No distress.  HENT:  Head: Normocephalic and atraumatic.  Right Ear: Hearing normal.  Left Ear: Hearing normal.  Nose: Nose normal.  Eyes: Conjunctivae and lids are normal. Right eye exhibits no discharge. Left eye exhibits no discharge. No scleral icterus.  Cardiovascular: Normal rate, regular rhythm and normal heart sounds.   Orthostatic tilt test and EKG normal  Pulmonary/Chest: Effort  normal and breath sounds normal. No respiratory distress.  Musculoskeletal: Normal range of motion.  Neurological: He is alert and oriented to person, place, and time.  Skin: Skin is intact. No rash noted.  Psychiatric: He has a normal mood and affect. His speech is normal and behavior is normal. Judgment and thought content normal. Cognition and memory are normal.    Results for orders placed or performed in visit on 06/26/15  LP+ALT+AST Piccolo, [employer]  Result Value Ref Range   ALT (SGPT) Piccolo, Waived 30 10 - 47 U/L   AST (SGOT) Piccolo, Waived 26 11 - 38 U/L   Cholesterol Piccolo, Waived 181 <200 mg/dL   HDL Chol Piccolo, Waived 37 (L) >59 mg/dL   Triglycerides Piccolo,Waived 200 (H) <150 mg/dL   Chol/HDL Ratio Piccolo,Waive 4.9 mg/dL   LDL Chol Calc Piccolo Waived 104 (H) <100 mg/dL   VLDL Chol Calc Piccolo,Waive 40 (H) <30 mg/dL  Basic metabolic panel  Result Value Ref Range   Glucose 97 65 - 99 mg/dL   BUN 22 8 - 27 mg/dL   Creatinine, Ser 1.00 0.76 - 1.27 mg/dL   GFR calc non Af Amer 78 >59 mL/min/1.73   GFR calc Af Amer 90 >59 mL/min/1.73   BUN/Creatinine Ratio 22 10 - 24   Sodium 141 134 - 144 mmol/L   Potassium  4.5 3.5 - 5.2 mmol/L   Chloride 104 96 - 106 mmol/L   CO2 21 18 - 29 mmol/L   Calcium 9.0 8.6 - 10.2 mg/dL      Assessment & Plan:   Problem List Items Addressed This Visit      Cardiovascular and Mediastinum   Near syncope   Relevant Orders   EKG 12-Lead (Completed)    Other Visit Diagnoses    Dehydration    -  Primary       Follow up plan: Return if symptoms worsen or fail to improve, for As scheduled.

## 2015-12-31 ENCOUNTER — Ambulatory Visit (INDEPENDENT_AMBULATORY_CARE_PROVIDER_SITE_OTHER): Payer: PPO | Admitting: Podiatry

## 2015-12-31 ENCOUNTER — Other Ambulatory Visit: Payer: Self-pay | Admitting: Family Medicine

## 2015-12-31 DIAGNOSIS — G5761 Lesion of plantar nerve, right lower limb: Secondary | ICD-10-CM | POA: Diagnosis not present

## 2015-12-31 DIAGNOSIS — I10 Essential (primary) hypertension: Secondary | ICD-10-CM

## 2015-12-31 DIAGNOSIS — G5781 Other specified mononeuropathies of right lower limb: Secondary | ICD-10-CM

## 2015-12-31 NOTE — Progress Notes (Signed)
He presents today chief complaint of pain to the forefoot right. States that his right heel is doing much better but he has pain to the forefoot right.  Objective: Vital signs stable alert and oriented 3 has no pain on palpation of the right heel. Pulses are palpable. He has pain on palpation to the third interspace of the right foot with a palpable Mulder's click.  Assessment: Resolving EPF right heel. Neuroma third interspace right foot.  Plan: Injected initial dose of Kenalog today discussed the possible need for dehydrated alcohol.

## 2016-01-01 ENCOUNTER — Other Ambulatory Visit: Payer: Self-pay | Admitting: Family Medicine

## 2016-01-01 DIAGNOSIS — I10 Essential (primary) hypertension: Secondary | ICD-10-CM

## 2016-01-09 ENCOUNTER — Encounter (INDEPENDENT_AMBULATORY_CARE_PROVIDER_SITE_OTHER): Payer: Self-pay

## 2016-01-21 ENCOUNTER — Ambulatory Visit (INDEPENDENT_AMBULATORY_CARE_PROVIDER_SITE_OTHER): Payer: PPO | Admitting: Family Medicine

## 2016-01-21 ENCOUNTER — Encounter: Payer: Self-pay | Admitting: Family Medicine

## 2016-01-21 VITALS — BP 128/84 | HR 72 | Temp 98.0°F | Ht 68.75 in | Wt 181.0 lb

## 2016-01-21 DIAGNOSIS — Z0000 Encounter for general adult medical examination without abnormal findings: Secondary | ICD-10-CM | POA: Diagnosis not present

## 2016-01-21 DIAGNOSIS — E7800 Pure hypercholesterolemia, unspecified: Secondary | ICD-10-CM | POA: Diagnosis not present

## 2016-01-21 DIAGNOSIS — I10 Essential (primary) hypertension: Secondary | ICD-10-CM | POA: Diagnosis not present

## 2016-01-21 DIAGNOSIS — E782 Mixed hyperlipidemia: Secondary | ICD-10-CM | POA: Diagnosis not present

## 2016-01-21 DIAGNOSIS — N400 Enlarged prostate without lower urinary tract symptoms: Secondary | ICD-10-CM | POA: Diagnosis not present

## 2016-01-21 DIAGNOSIS — Z23 Encounter for immunization: Secondary | ICD-10-CM

## 2016-01-21 DIAGNOSIS — R55 Syncope and collapse: Secondary | ICD-10-CM | POA: Diagnosis not present

## 2016-01-21 LAB — URINALYSIS, ROUTINE W REFLEX MICROSCOPIC
BILIRUBIN UA: NEGATIVE
GLUCOSE, UA: NEGATIVE
KETONES UA: NEGATIVE
LEUKOCYTES UA: NEGATIVE
NITRITE UA: NEGATIVE
SPEC GRAV UA: 1.03 (ref 1.005–1.030)
Urobilinogen, Ur: 0.2 mg/dL (ref 0.2–1.0)
pH, UA: 5 (ref 5.0–7.5)

## 2016-01-21 LAB — MICROSCOPIC EXAMINATION: WBC UA: NONE SEEN /HPF (ref 0–?)

## 2016-01-21 MED ORDER — ATORVASTATIN CALCIUM 40 MG PO TABS: 40.0000 mg | ORAL_TABLET | Freq: Every day | ORAL | 4 refills | Status: DC

## 2016-01-21 MED ORDER — LISINOPRIL 10 MG PO TABS: 10.0000 mg | ORAL_TABLET | Freq: Every day | ORAL | 4 refills | Status: DC

## 2016-01-21 MED ORDER — METOPROLOL SUCCINATE ER 50 MG PO TB24: 50.0000 mg | ORAL_TABLET | Freq: Every day | ORAL | 4 refills | Status: DC

## 2016-01-21 NOTE — Assessment & Plan Note (Signed)
The current medical regimen is effective;  continue present plan and medications.  

## 2016-01-21 NOTE — Assessment & Plan Note (Signed)
Patient will continue to observe and check. Any further issues may consider cardiology referral.

## 2016-01-21 NOTE — Progress Notes (Signed)
BP 128/84 (BP Location: Left Arm)   Pulse 72   Temp 98 F (36.7 C)   Ht 5' 8.75" (1.746 m)   Wt 181 lb (82.1 kg)   SpO2 98%   BMI 26.92 kg/m    Subjective:    Patient ID: Neil Bush, male    DOB: [birth year], 65 y.o.   MRN: [id number]  HPI: Neil Bush is a 65 y.o. male  Chief Complaint  Patient presents with  . Annual Exam  AWV metrics met  Patient's primary concern today is has had another episode of near syncope. 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 Symptoms lasted about 5 minutes and then he felt okay and is felt okay ever since. On review has occasional PVC episodes that may be a month or more part. Blood pressure doing well with medications no complaints or concerns and takes meloxicam for his feet which helps a lot.  Relevant past medical, surgical, family and social history reviewed and updated as indicated. Interim medical history since our last visit reviewed. Allergies and medications reviewed and updated.  Review of Systems  Constitutional: Negative.   HENT: Negative.   Eyes: Negative.   Respiratory: Negative.   Cardiovascular: Negative.   Gastrointestinal: Negative.   Endocrine: Negative.   Genitourinary: Negative.   Musculoskeletal: Negative.   Skin: Negative.   Allergic/Immunologic: Negative.   Neurological: Negative.   Hematological: Negative.   Psychiatric/Behavioral: Negative.     Per HPI unless specifically indicated above     Objective:    BP 128/84 (BP Location: Left Arm)   Pulse 72   Temp 98 F (36.7 C)   Ht 5' 8.75" (1.746 m)   Wt 181 lb (82.1 kg)   SpO2 98%   BMI 26.92 kg/m   Wt Readings from Last 3 Encounters:  01/21/16 181 lb (82.1 kg)  11/20/15 164 lb 6.4 oz (74.6 kg)  06/26/15 182 lb (82.6 kg)    Physical Exam  Constitutional: He is  oriented to person, place, and time. He appears well-developed and well-nourished.  HENT:  Head: Normocephalic and atraumatic.  Right Ear: External ear normal.  Left Ear: External ear normal.  Eyes: Conjunctivae and EOM are normal. Pupils are equal, round, and reactive to light.  Neck: Normal range of motion. Neck supple.  Cardiovascular: Normal rate, regular rhythm, normal heart sounds and intact distal pulses.   Pulmonary/Chest: Effort normal and breath sounds normal.  Abdominal: Soft. Bowel sounds are normal. There is no splenomegaly or hepatomegaly.  Genitourinary: Rectum normal and penis normal.  Genitourinary Comments: Prostate enlarged  Musculoskeletal: Normal range of motion.  Neurological: He is alert and oriented to person, place, and time. He has normal reflexes.  Skin: No rash noted. No erythema.  Psychiatric: He has a normal mood and affect. His behavior is normal. Judgment and thought content normal.   EKG normal sinus rhythm with no acute changes, no issues of conduction changes  Results for orders placed or performed in visit on 06/26/15  LP+ALT+AST Piccolo, [employer]  Result Value Ref Range   ALT (SGPT) Piccolo, Waived 30 10 - 47 U/L   AST (SGOT) Piccolo, Waived 26 11 - 38 U/L   Cholesterol Piccolo, Waived 181 <200 mg/dL   HDL Chol Piccolo, Waived 37 (L) >59 mg/dL   Triglycerides  Piccolo,Waived 200 (H) <150 mg/dL   Chol/HDL Ratio Piccolo,Waive 4.9 mg/dL   LDL Chol Calc Piccolo Waived 104 (H) <100 mg/dL   VLDL Chol Calc Piccolo,Waive 40 (H) <30 mg/dL  Basic metabolic panel  Result Value Ref Range   Glucose 97 65 - 99 mg/dL   BUN 22 8 - 27 mg/dL   Creatinine, Ser 1.00 0.76 - 1.27 mg/dL   GFR calc non Af Amer 78 >59 mL/min/1.73   GFR calc Af Amer 90 >59 mL/min/1.73   BUN/Creatinine Ratio 22 10 - 24   Sodium 141 134 - 144 mmol/L   Potassium 4.5 3.5 - 5.2 mmol/L   Chloride 104 96 - 106 mmol/L   CO2 21 18 - 29 mmol/L   Calcium 9.0 8.6 - 10.2 mg/dL      Assessment &  Plan:   Problem List Items Addressed This Visit      Cardiovascular and Mediastinum   Hypertension    The current medical regimen is effective;  continue present plan and medications.       Relevant Medications   atorvastatin (LIPITOR) 40 MG tablet   lisinopril (PRINIVIL,ZESTRIL) 10 MG tablet   metoprolol succinate (TOPROL-XL) 50 MG 24 hr tablet   Other Relevant Orders   Comprehensive metabolic panel   CBC with Differential/Platelet   TSH   Urinalysis, Routine w reflex microscopic (not at [facility])   Near syncope    Patient will continue to observe and check. Any further issues may consider cardiology referral.      Relevant Medications   atorvastatin (LIPITOR) 40 MG tablet   lisinopril (PRINIVIL,ZESTRIL) 10 MG tablet   metoprolol succinate (TOPROL-XL) 50 MG 24 hr tablet   Other Relevant Orders   EKG 12-Lead (Completed)     Genitourinary   BPH (benign prostatic hyperplasia)    The current medical regimen is effective;  continue present plan and medications.       Relevant Orders   PSA   Urinalysis, Routine w reflex microscopic (not at [facility])     Other   Hyperlipidemia - Primary    The current medical regimen is effective;  continue present plan and medications.       Relevant Medications   atorvastatin (LIPITOR) 40 MG tablet   lisinopril (PRINIVIL,ZESTRIL) 10 MG tablet   metoprolol succinate (TOPROL-XL) 50 MG 24 hr tablet   Other Relevant Orders   Lipid Profile    Other Visit Diagnoses    Needs flu shot       Need for pneumococcal vaccination       Relevant Orders   Pneumococcal polysaccharide vaccine 23-valent greater than or equal to 2yo subcutaneous/IM (Completed)   Encounter for immunization       Relevant Orders   Lipid Profile   Comprehensive metabolic panel   CBC with Differential/Platelet   PSA   TSH   Urinalysis, Routine w reflex microscopic (not at [facility])   Pneumococcal polysaccharide vaccine 23-valent greater than or equal to 2yo subcutaneous/IM  (Completed)   Flu vaccine HIGH DOSE PF (Completed)   EKG 12-Lead (Completed)       Follow up plan: Return in about 6 months (around 07/20/2016) for BMP,  Lipids, ALT, AST.

## 2016-01-22 ENCOUNTER — Encounter: Payer: Self-pay | Admitting: Family Medicine

## 2016-01-22 LAB — CBC WITH DIFFERENTIAL/PLATELET
Basophils Absolute: 0 10*3/uL (ref 0.0–0.2)
Basos: 0 %
EOS (ABSOLUTE): 0.1 10*3/uL (ref 0.0–0.4)
EOS: 2 %
HEMATOCRIT: 49.7 % (ref 37.5–51.0)
Hemoglobin: 17.1 g/dL (ref 12.6–17.7)
IMMATURE GRANS (ABS): 0 10*3/uL (ref 0.0–0.1)
IMMATURE GRANULOCYTES: 0 %
LYMPHS: 23 %
Lymphocytes Absolute: 2 10*3/uL (ref 0.7–3.1)
MCH: 32.3 pg (ref 26.6–33.0)
MCHC: 34.4 g/dL (ref 31.5–35.7)
MCV: 94 fL (ref 79–97)
MONOS ABS: 0.6 10*3/uL (ref 0.1–0.9)
Monocytes: 7 %
NEUTROS PCT: 68 %
Neutrophils Absolute: 5.8 10*3/uL (ref 1.4–7.0)
PLATELETS: 276 10*3/uL (ref 150–379)
RBC: 5.29 x10E6/uL (ref 4.14–5.80)
RDW: 13.7 % (ref 12.3–15.4)
WBC: 8.5 10*3/uL (ref 3.4–10.8)

## 2016-01-22 LAB — COMPREHENSIVE METABOLIC PANEL
ALK PHOS: 77 IU/L (ref 39–117)
ALT: 20 IU/L (ref 0–44)
AST: 15 IU/L (ref 0–40)
Albumin/Globulin Ratio: 1.7 (ref 1.2–2.2)
Albumin: 4.5 g/dL (ref 3.6–4.8)
BUN/Creatinine Ratio: 27 — ABNORMAL HIGH (ref 10–24)
BUN: 30 mg/dL — ABNORMAL HIGH (ref 8–27)
Bilirubin Total: 0.7 mg/dL (ref 0.0–1.2)
CALCIUM: 9.2 mg/dL (ref 8.6–10.2)
CO2: 20 mmol/L (ref 18–29)
CREATININE: 1.12 mg/dL (ref 0.76–1.27)
Chloride: 100 mmol/L (ref 96–106)
GFR calc Af Amer: 78 mL/min/{1.73_m2} (ref >59)
GFR, EST NON AFRICAN AMERICAN: 68 mL/min/{1.73_m2} (ref >59)
GLOBULIN, TOTAL: 2.6 g/dL (ref 1.5–4.5)
GLUCOSE: 95 mg/dL (ref 65–99)
Potassium: 4.6 mmol/L (ref 3.5–5.2)
SODIUM: 142 mmol/L (ref 134–144)
Total Protein: 7.1 g/dL (ref 6.0–8.5)

## 2016-01-22 LAB — LIPID PANEL
CHOL/HDL RATIO: 3.7 ratio (ref 0.0–5.0)
CHOLESTEROL TOTAL: 162 mg/dL (ref 100–199)
HDL: 44 mg/dL (ref >39)
LDL CALC: 88 mg/dL (ref 0–99)
Triglycerides: 149 mg/dL (ref 0–149)
VLDL CHOLESTEROL CAL: 30 mg/dL (ref 5–40)

## 2016-01-22 LAB — PSA: Prostate Specific Ag, Serum: 0.9 ng/mL (ref 0.0–4.0)

## 2016-01-22 LAB — TSH: TSH: 2.48 u[IU]/mL (ref 0.450–4.500)

## 2016-01-26 ENCOUNTER — Observation Stay
Admission: EM | Admit: 2016-01-26 | Discharge: 2016-01-27 | Disposition: A | Discharge status: Home / Self Care | Payer: PPO | Attending: Internal Medicine | Admitting: Internal Medicine

## 2016-01-26 ENCOUNTER — Encounter: Payer: Self-pay | Admitting: Internal Medicine

## 2016-01-26 ENCOUNTER — Emergency Department: Payer: PPO

## 2016-01-26 ENCOUNTER — Observation Stay (HOSPITAL_BASED_OUTPATIENT_CLINIC_OR_DEPARTMENT_OTHER)
Admit: 2016-01-26 | Discharge: 2016-01-26 | Disposition: A | Discharge status: Home / Self Care | Payer: PPO | Attending: Internal Medicine | Admitting: Internal Medicine

## 2016-01-26 DIAGNOSIS — R0789 Other chest pain: Principal | ICD-10-CM | POA: Insufficient documentation

## 2016-01-26 DIAGNOSIS — I480 Paroxysmal atrial fibrillation: Secondary | ICD-10-CM | POA: Diagnosis not present

## 2016-01-26 DIAGNOSIS — Z7982 Long term (current) use of aspirin: Secondary | ICD-10-CM | POA: Diagnosis not present

## 2016-01-26 DIAGNOSIS — I351 Nonrheumatic aortic (valve) insufficiency: Secondary | ICD-10-CM

## 2016-01-26 DIAGNOSIS — E785 Hyperlipidemia, unspecified: Secondary | ICD-10-CM | POA: Insufficient documentation

## 2016-01-26 DIAGNOSIS — Z79899 Other long term (current) drug therapy: Secondary | ICD-10-CM | POA: Diagnosis not present

## 2016-01-26 DIAGNOSIS — I10 Essential (primary) hypertension: Secondary | ICD-10-CM | POA: Diagnosis not present

## 2016-01-26 DIAGNOSIS — I4891 Unspecified atrial fibrillation: Secondary | ICD-10-CM | POA: Diagnosis not present

## 2016-01-26 DIAGNOSIS — R079 Chest pain, unspecified: Secondary | ICD-10-CM

## 2016-01-26 DIAGNOSIS — Z9049 Acquired absence of other specified parts of digestive tract: Secondary | ICD-10-CM | POA: Insufficient documentation

## 2016-01-26 DIAGNOSIS — I200 Unstable angina: Secondary | ICD-10-CM

## 2016-01-26 LAB — CBC WITH DIFFERENTIAL/PLATELET
BASOS ABS: 0.1 10*3/uL (ref 0–0.1)
BASOS PCT: 1 %
EOS ABS: 0.3 10*3/uL (ref 0–0.7)
EOS PCT: 3 %
HCT: 52.2 % — ABNORMAL HIGH (ref 40.0–52.0)
Hemoglobin: 18.1 g/dL — ABNORMAL HIGH (ref 13.0–18.0)
Lymphocytes Relative: 25 %
Lymphs Abs: 2.8 10*3/uL (ref 1.0–3.6)
MCH: 32.3 pg (ref 26.0–34.0)
MCHC: 34.6 g/dL (ref 32.0–36.0)
MCV: 93.2 fL (ref 80.0–100.0)
Monocytes Absolute: 1.2 10*3/uL — ABNORMAL HIGH (ref 0.2–1.0)
Monocytes Relative: 11 %
Neutro Abs: 6.7 10*3/uL — ABNORMAL HIGH (ref 1.4–6.5)
Neutrophils Relative %: 60 %
PLATELETS: 255 10*3/uL (ref 150–440)
RBC: 5.6 MIL/uL (ref 4.40–5.90)
RDW: 13.6 % (ref 11.5–14.5)
WBC: 11.2 10*3/uL — AB (ref 3.8–10.6)

## 2016-01-26 LAB — COMPREHENSIVE METABOLIC PANEL
ALBUMIN: 4.3 g/dL (ref 3.5–5.0)
ALT: 25 U/L (ref 17–63)
AST: 30 U/L (ref 15–41)
Alkaline Phosphatase: 77 U/L (ref 38–126)
Anion gap: 5 (ref 5–15)
BUN: 32 mg/dL — AB (ref 6–20)
CHLORIDE: 107 mmol/L (ref 101–111)
CO2: 28 mmol/L (ref 22–32)
CREATININE: 1.1 mg/dL (ref 0.61–1.24)
Calcium: 9 mg/dL (ref 8.9–10.3)
GFR calc non Af Amer: >60 mL/min (ref >60)
Glucose, Bld: 132 mg/dL — ABNORMAL HIGH (ref 65–99)
Potassium: 4 mmol/L (ref 3.5–5.1)
SODIUM: 140 mmol/L (ref 135–145)
Total Bilirubin: 0.8 mg/dL (ref 0.3–1.2)
Total Protein: 7.3 g/dL (ref 6.5–8.1)

## 2016-01-26 LAB — TROPONIN I: Troponin I: <0.03 ng/mL (ref <0.03)

## 2016-01-26 LAB — BASIC METABOLIC PANEL
Anion gap: 4 — ABNORMAL LOW (ref 5–15)
BUN: 27 mg/dL — ABNORMAL HIGH (ref 6–20)
CALCIUM: 8.7 mg/dL — AB (ref 8.9–10.3)
CHLORIDE: 107 mmol/L (ref 101–111)
CO2: 27 mmol/L (ref 22–32)
CREATININE: 1.02 mg/dL (ref 0.61–1.24)
GFR calc non Af Amer: >60 mL/min (ref >60)
Glucose, Bld: 111 mg/dL — ABNORMAL HIGH (ref 65–99)
Potassium: 4.3 mmol/L (ref 3.5–5.1)
SODIUM: 138 mmol/L (ref 135–145)

## 2016-01-26 LAB — TSH: TSH: 1.331 u[IU]/mL (ref 0.350–4.500)

## 2016-01-26 LAB — PROTIME-INR
INR: 0.92
Prothrombin Time: 12.3 seconds (ref 11.4–15.2)

## 2016-01-26 LAB — T4, FREE: Free T4: 0.87 ng/dL (ref 0.61–1.12)

## 2016-01-26 MED ORDER — LISINOPRIL 10 MG PO TABS: 10.0000 mg | ORAL_TABLET | Freq: Every day | ORAL | Status: DC

## 2016-01-26 MED ORDER — RIVAROXABAN 20 MG PO TABS
20.0000 mg | ORAL_TABLET | Freq: Every day | ORAL | Status: DC
  Administered 2016-01-26 – 2016-01-27 (×2): 20 mg via ORAL
  Filled 2016-01-26 (×2): qty 1

## 2016-01-26 MED ORDER — SODIUM CHLORIDE 0.9 % IV BOLUS (SEPSIS)
1000.0000 mL | Freq: Once | INTRAVENOUS | Status: AC
  Administered 2016-01-26: 1000 mL via INTRAVENOUS

## 2016-01-26 MED ORDER — ASPIRIN 300 MG RE SUPP: 300.0000 mg | RECTAL | Status: DC

## 2016-01-26 MED ORDER — DILTIAZEM HCL 25 MG/5ML IV SOLN
INTRAVENOUS | Status: AC
  Administered 2016-01-26: 10 mg via INTRAVENOUS
  Filled 2016-01-26: qty 5

## 2016-01-26 MED ORDER — GI COCKTAIL ~~LOC~~
30.0000 mL | Freq: Four times a day (QID) | ORAL | Status: AC
  Administered 2016-01-26 – 2016-01-27 (×4): 30 mL via ORAL
  Filled 2016-01-26 (×4): qty 30

## 2016-01-26 MED ORDER — DILTIAZEM HCL 25 MG/5ML IV SOLN
10.0000 mg | Freq: Once | INTRAVENOUS | Status: AC
  Administered 2016-01-26: 10 mg via INTRAVENOUS

## 2016-01-26 MED ORDER — ASPIRIN 81 MG PO CHEW: 324.0000 mg | CHEWABLE_TABLET | ORAL | Status: DC

## 2016-01-26 MED ORDER — DILTIAZEM HCL 25 MG/5ML IV SOLN
10.0000 mg | Freq: Once | INTRAVENOUS | Status: AC
  Administered 2016-01-26: 10 mg via INTRAVENOUS
  Filled 2016-01-26: qty 5

## 2016-01-26 MED ORDER — ASPIRIN EC 81 MG PO TBEC: 81.0000 mg | DELAYED_RELEASE_TABLET | Freq: Every day | ORAL | Status: DC

## 2016-01-26 MED ORDER — ATORVASTATIN CALCIUM 20 MG PO TABS
40.0000 mg | ORAL_TABLET | Freq: Every day | ORAL | Status: DC
  Administered 2016-01-26: 40 mg via ORAL
  Filled 2016-01-26: qty 2

## 2016-01-26 MED ORDER — DILTIAZEM HCL 30 MG PO TABS
30.0000 mg | ORAL_TABLET | Freq: Four times a day (QID) | ORAL | Status: DC
  Administered 2016-01-26 – 2016-01-27 (×4): 30 mg via ORAL
  Filled 2016-01-26 (×5): qty 1

## 2016-01-26 MED ORDER — SODIUM CHLORIDE 0.9 % IV SOLN: 250.0000 mL | INTRAVENOUS | Status: DC | PRN

## 2016-01-26 MED ORDER — NITROGLYCERIN 0.4 MG SL SUBL: 0.4000 mg | SUBLINGUAL_TABLET | SUBLINGUAL | Status: DC | PRN

## 2016-01-26 MED ORDER — NITROGLYCERIN 2 % TD OINT
1.0000 [in_us] | TOPICAL_OINTMENT | Freq: Once | TRANSDERMAL | Status: AC
  Administered 2016-01-26: 1 [in_us] via TOPICAL
  Filled 2016-01-26: qty 1

## 2016-01-26 MED ORDER — ASPIRIN 81 MG PO CHEW
324.0000 mg | CHEWABLE_TABLET | Freq: Once | ORAL | Status: AC
  Administered 2016-01-26: 324 mg via ORAL
  Filled 2016-01-26: qty 4

## 2016-01-26 MED ORDER — ACETAMINOPHEN 325 MG PO TABS
650.0000 mg | ORAL_TABLET | ORAL | Status: DC | PRN
  Administered 2016-01-26: 650 mg via ORAL
  Filled 2016-01-26: qty 2

## 2016-01-26 MED ORDER — SODIUM CHLORIDE 0.9% FLUSH: 3.0000 mL | INTRAVENOUS | Status: DC | PRN

## 2016-01-26 MED ORDER — ONDANSETRON HCL 4 MG/2ML IJ SOLN: 4.0000 mg | Freq: Four times a day (QID) | INTRAMUSCULAR | Status: DC | PRN

## 2016-01-26 MED ORDER — SODIUM CHLORIDE 0.9% FLUSH
3.0000 mL | Freq: Two times a day (BID) | INTRAVENOUS | Status: DC
  Administered 2016-01-26 – 2016-01-27 (×3): 3 mL via INTRAVENOUS

## 2016-01-26 MED ORDER — ASPIRIN 81 MG PO TABS: 81.0000 mg | ORAL_TABLET | Freq: Every day | ORAL | Status: DC

## 2016-01-26 NOTE — Progress Notes (Signed)
[place] at [facility]                                                                                                                                                                                  Patient Demographics   Neil Bush, is a 65 y.o. male, DOB - 1950/05/20, [id number]  Admit date - 01/26/2016   Admitting Physician Saundra Shelling, MD  Outpatient Primary MD for the patient is Golden Pop, MD   LOS - 0  Subjective: Patient presented with atrial fibrillation with rapid ventricular rate as well as chest pain. Heart rate is currently improved. He continues to have sensation of pressure in his chest and a burning type of feeling in his chest. He reports that he's been having this for a long time however this time is persistent.    Review of Systems:   CONSTITUTIONAL: No documented fever. No fatigue, weakness. No weight gain, no weight loss.  EYES: No blurry or double vision.  ENT: No tinnitus. No postnasal drip. No redness of the oropharynx.  RESPIRATORY: No cough, no wheeze, no hemoptysis. No dyspnea.  CARDIOVASCULAR:Intermittent chest pain. No orthopnea. No palpitations. No syncope.  GASTROINTESTINAL: No nausea, no vomiting or diarrhea. No abdominal pain. No melena or hematochezia.  GENITOURINARY: No dysuria or hematuria.  ENDOCRINE: No polyuria or nocturia. No heat or cold intolerance.  HEMATOLOGY: No anemia. No bruising. No bleeding.  INTEGUMENTARY: No rashes. No lesions.  MUSCULOSKELETAL: No arthritis. No swelling. No gout.  NEUROLOGIC: No numbness, tingling, or ataxia. No seizure-type activity.  PSYCHIATRIC: No anxiety. No insomnia. No ADD.    Vitals:   Vitals:   01/26/16 0515 01/26/16 0545 01/26/16 0600 01/26/16 0749  BP: 113/83 111/76  104/77  Pulse: (!) 103 85  64  Resp: 16 15    Temp:  97.7 F (36.5 C)  97.6 F (36.4 C)  TempSrc:  Oral  Oral  SpO2: 96% 98%  95%  Weight:   180 lb 3.2 oz (81.7 kg)     Wt Readings from  Last 3 Encounters:  01/26/16 180 lb 3.2 oz (81.7 kg)  01/21/16 181 lb (82.1 kg)  11/20/15 164 lb 6.4 oz (74.6 kg)     Intake/Output Summary (Last 24 hours) at 01/26/16 1220 Last data filed at 01/26/16 0900  Gross per 24 hour  Intake             1240 ml  Output                0 ml  Net             1240 ml    Physical Exam:  GENERAL: Pleasant-appearing in no apparent distress.  HEAD, EYES, EARS, NOSE AND THROAT: Atraumatic, normocephalic. Extraocular muscles are intact. Pupils equal and reactive to light. Sclerae anicteric. No conjunctival injection. No oro-pharyngeal erythema.  NECK: Supple. There is no jugular venous distention. No bruits, no lymphadenopathy, no thyromegaly.  HEART: Regular rate and rhythm,. No murmurs, no rubs, no clicks.  LUNGS: Clear to auscultation bilaterally. No rales or rhonchi. No wheezes.  ABDOMEN: Soft, flat, nontender, nondistended. Has good bowel sounds. No hepatosplenomegaly appreciated.  EXTREMITIES: No evidence of any cyanosis, clubbing, or peripheral edema.  +2 pedal and radial pulses bilaterally.  NEUROLOGIC: The patient is alert, awake, and oriented x3 with no focal motor or sensory deficits appreciated bilaterally.  SKIN: Moist and warm with no rashes appreciated.  Psych: Not anxious, depressed LN: No inguinal LN enlargement    Antibiotics   Anti-infectives    None      Medications   Scheduled Meds: . atorvastatin  40 mg Oral q1800  . diltiazem  30 mg Oral Q6H  . gi cocktail  30 mL Oral Q6H  . rivaroxaban  20 mg Oral Daily  . sodium chloride flush  3 mL Intravenous Q12H   Continuous Infusions: PRN Meds:.sodium chloride, acetaminophen, nitroGLYCERIN, ondansetron (ZOFRAN) IV, sodium chloride flush   Data Review:   Micro Results Recent Results (from the past 240 hour(s))  Microscopic Examination     Status: None   Collection Time: 01/21/16  9:05 AM  Result Value Ref Range Status   WBC, UA None seen 0 - 5 /hpf Final   RBC, UA  0-2 0 - 2 /hpf Final   Epithelial Cells (non renal) 0-10 0 - 10 /hpf Final   Casts Present None seen /lpf Final   Cast Type Hyaline casts N/A Final    Radiology Reports Dg Chest Port 1 View  Result Date: 01/26/2016 CLINICAL DATA:  Palpitations. Burning chest pain across the entire chest beginning earlier in the evening and then ceased. EXAM: PORTABLE CHEST 1 VIEW COMPARISON:  09/23/2008 FINDINGS: The heart size and mediastinal contours are within normal limits. Both lungs are clear. The visualized skeletal structures are unremarkable. IMPRESSION: No active disease. Electronically Signed   By: Lucienne Capers M.D.   On: 01/26/2016 02:40     CBC  Recent Labs Lab 01/21/16 0905 01/26/16 0209  WBC 8.5 11.2*  HGB  --  18.1*  HCT 49.7 52.2*  PLT 276 255  MCV 94 93.2  MCH 32.3 32.3  MCHC 34.4 34.6  RDW 13.7 13.6  LYMPHSABS 2.0 2.8  MONOABS  --  1.2*  EOSABS 0.1 0.3  BASOSABS 0.0 0.1    Chemistries   Recent Labs Lab 01/21/16 0905 01/26/16 0209 01/26/16 0815  NA 142 140 138  K 4.6 4.0 4.3  CL 100 107 107  CO2 20 28 27   GLUCOSE 95 132* 111*  BUN 30* 32* 27*  CREATININE 1.12 1.10 1.02  CALCIUM 9.2 9.0 8.7*  AST 15 30  --   ALT 20 25  --   ALKPHOS 77 77  --   BILITOT 0.7 0.8  --    ------------------------------------------------------------------------------------------------------------------ estimated creatinine clearance is 69.7 mL/min (by C-G formula based on SCr of 1.02 mg/dL). ------------------------------------------------------------------------------------------------------------------ No results for input(s): HGBA1C in the last 72 hours. ------------------------------------------------------------------------------------------------------------------ No results for input(s): CHOL, HDL, LDLCALC, TRIG, CHOLHDL, LDLDIRECT in the last 72  hours. ------------------------------------------------------------------------------------------------------------------  Recent Labs  01/26/16 0815  TSH 1.331   ------------------------------------------------------------------------------------------------------------------ No results for input(s): VITAMINB12, FOLATE,  FERRITIN, TIBC, IRON, RETICCTPCT in the last 72 hours.  Coagulation profile  Recent Labs Lab 01/26/16 0209  INR 0.92    No results for input(s): DDIMER in the last 72 hours.  Cardiac Enzymes  Recent Labs Lab 01/26/16 0209 01/26/16 0815  TROPONINI <0.03 <0.03   ------------------------------------------------------------------------------------------------------------------ Invalid input(s): POCBNP    Assessment & Plan  Patient is a 65 year old presented with chest pain noted to have A. fib with RVR 1 paroxysmal atrial fibrillation Patient's heart rate now controlled currently in normal sinus rhythm Has been started on Cardizem, as well as Xarelto I discussed with the patient regarding importance of anticoagulation and various options she is agreeable to Xarelto therapy Cardiology evaluation done Await echocardiogram of the heart  2. Chest pain Has chronic symptoms no evidence of MI Await echocardiogram of the heart Will need ischemic workup timing to be determined by cardiology  3. Essential hypertension Due to blood pressure being borderline I will stop his ACE inhibitor due to him being on Cardizem  4.  Hyperlipidemia unspecified intended therapy with atorvastatin  5. Miscellaneous DVT prophylaxis with Xarelto      Code Status Orders        Start     Ordered   01/26/16 0439  Full code  Continuous     01/26/16 0439    Code Status History    Date Active Date Inactive Code Status Order ID Comments User Context   This patient has a current code status but no historical code status.           Consults  cards   DVT  Prophylaxis xarelto  Lab Results  Component Value Date   PLT 255 01/26/2016     Time Spent in minutes   5min  Greater than 50% of time spent in care coordination and counseling patient regarding the condition and plan of care.   Dustin Flock M.D on 01/26/2016 at 12:20 PM  Between 7am to 6pm - Pager - [phone]  After 6pm go to [email or web address] - password [email or web address] [facility]   Office  [phone]

## 2016-01-26 NOTE — ED Notes (Signed)
ED Provider at bedside. 

## 2016-01-26 NOTE — ED Provider Notes (Signed)
[facility] Emergency Department Provider Note   ____________________________________________   First MD Initiated Contact with Patient 01/26/16 0217     (approximate)  I have reviewed the triage vital signs and the nursing notes.   HISTORY  Chief Complaint Chest pain, palpitations   HPI Neil Bush is a 65 y.o. male who presents to the ED from home with a chief complaint of chest burning and palpitations. Patient reports a history of paroxysmal atrial fibrillation, on aspirin and metoprolol, who began having palpitations and chest burning at 8:30 PM. He usually takes 1/2 metoprolol 3 times a week, took an extra 1/2 tablet and went to sleep. Awoke with heart rate in the 150s and waxing/waning chest pain which he describes as nonradiating burning sensation. Chest discomfort not associated with diaphoresis, shortness of breath, nausea/vomiting or dizziness. Denies recent fever, chills, abdominal pain, nausea, vomiting, diarrhea. Denies caffeine use. Denies recent travel, trauma or hormone use. Nothing makes his symptoms better or worse.   Past Medical History:  Diagnosis Date  . Hyperlipidemia   . Hypertension     Patient Active Problem List   Diagnosis Date Noted  . Near syncope 11/20/2015  . 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 BPH (benign prostatic hyperplasia) 12/26/2014  . Hyperlipidemia   . Hypertension     Past Surgical History:  Procedure Laterality Date  . CHOLECYSTECTOMY    . EYE SURGERY    . FOOT SURGERY    . HERNIA REPAIR     X 2 ingunial  . KNEE SURGERY Right     Prior to Admission medications   Medication Sig Start Date End Date Taking? Authorizing Provider  aspirin 81 MG tablet Take 81 mg by mouth daily.    Historical Provider, MD  atorvastatin (LIPITOR) 40 MG tablet Take 1 tablet (40 mg total) by mouth daily at 6 PM. 01/21/16   Guadalupe Maple, MD  lisinopril (PRINIVIL,ZESTRIL) 10 MG tablet Take 1 tablet (10 mg total) by mouth daily.  01/21/16   Guadalupe Maple, MD  meloxicam (MOBIC) 15 MG tablet Take 1 tablet (15 mg total) by mouth daily. 11/19/15   Max T Hyatt, DPM  metoprolol succinate (TOPROL-XL) 50 MG 24 hr tablet Take 1 tablet (50 mg total) by mouth daily. Take with or immediately following a meal. 01/21/16   Guadalupe Maple, MD  valACYclovir (VALTREX) 1000 MG tablet Take 1 tablet (1,000 mg total) by mouth 3 (three) times daily as needed. 12/26/14   Guadalupe Maple, MD    Allergies Penicillins  Family History  Problem Relation Age of Onset  . Cancer Mother   . Heart disease Father   . Diabetes Father   . Hypertension Father   . Heart disease Brother   . COPD Neg Hx   . Stroke Neg Hx     Social History Social History  Substance Use Topics  . Smoking status: Never Smoker  . Smokeless tobacco: Never Used  . Alcohol use No    Review of Systems  Constitutional: No fever/chills. Eyes: No visual changes. ENT: No sore throat. Cardiovascular: Positive for chest pain and palpitations. Respiratory: Denies shortness of breath. Gastrointestinal: No abdominal pain.  No nausea, no vomiting.  No diarrhea.  No constipation. Genitourinary: Negative for dysuria. Musculoskeletal: Negative for back pain. Skin: Negative for rash. Neurological: Negative for headaches, focal weakness or numbness.  10-point ROS otherwise negative.  ____________________________________________   PHYSICAL EXAM:  VITAL SIGNS: ED Triage Vitals  Enc Vitals Group  BP 01/26/16 0214 (!) 138/108     Pulse Rate 01/26/16 0214 (!) 31     Resp 01/26/16 0214 (!) 25     Temp 01/26/16 0216 97.7 F (36.5 C)     Temp Source 01/26/16 0216 Oral     SpO2 01/26/16 0214 98 %     Weight 01/26/16 0203 185 lb (83.9 kg)     Height --      Head Circumference --      Peak Flow --      Pain Score 01/26/16 0203 9     Pain Loc --      Pain Edu? --      Excl. in [place]? --     Constitutional: Alert and oriented. Well appearing and in no acute  distress. Eyes: Conjunctivae are normal. PERRL. EOMI. Head: Atraumatic. Nose: No congestion/rhinnorhea. Mouth/Throat: Mucous membranes are moist.  Oropharynx non-erythematous. Neck: No stridor.   Cardiovascular: Tachycardic rate, irregular rhythm. Grossly normal heart sounds.  Good peripheral circulation. Respiratory: Normal respiratory effort.  No retractions. Lungs CTAB. Gastrointestinal: Soft and nontender. No distention. No abdominal bruits. No CVA tenderness. Musculoskeletal: No lower extremity tenderness nor edema.  No joint effusions. Neurologic:  Normal speech and language. No gross focal neurologic deficits are appreciated. No gait instability. Skin:  Skin is warm, dry and intact. No rash noted. Psychiatric: Mood and affect are normal. Speech and behavior are normal.  ____________________________________________   LABS (all labs ordered are listed, but only abnormal results are displayed)  Labs Reviewed  CBC WITH DIFFERENTIAL/PLATELET - Abnormal; Notable for the following:       Result Value   WBC 11.2 (*)    Hemoglobin 18.1 (*)    HCT 52.2 (*)    Neutro Abs 6.7 (*)    Monocytes Absolute 1.2 (*)    All other components within normal limits  COMPREHENSIVE METABOLIC PANEL - Abnormal; Notable for the following:    Glucose, Bld 132 (*)    BUN 32 (*)    All other components within normal limits  TROPONIN I  PROTIME-INR   ____________________________________________  EKG  ED ECG REPORT I, Asher Torpey J, the attending physician, personally viewed and interpreted this ECG.   Date: 01/26/2016  EKG Time: 0202  Rate: 135  Rhythm: atrial fibrillation, rate 135  Axis: Normal  Intervals:none  ST&T Change: Nonspecific  ____________________________________________  RADIOLOGY  Portable chest x-ray (viewed by me, interpreted per Dr. Gerilyn Nestle): No active disease. ____________________________________________   PROCEDURES  Procedure(s) performed:  None  Procedures  Critical Care performed: Yes, see critical care note(s)   CRITICAL CARE Performed by: Paulette Blanch   Total critical care time: 30 minutes  Critical care time was exclusive of separately billable procedures and treating other patients.  Critical care was necessary to treat or prevent imminent or life-threatening deterioration.  Critical care was time spent personally by me on the following activities: development of treatment plan with patient and/or surrogate as well as nursing, discussions with consultants, evaluation of patient's response to treatment, examination of patient, obtaining history from patient or surrogate, ordering and performing treatments and interventions, ordering and review of laboratory studies, ordering and review of radiographic studies, pulse oximetry and re-evaluation of patient's condition.  ____________________________________________   INITIAL IMPRESSION / ASSESSMENT AND PLAN / ED COURSE  Pertinent labs & imaging results that were available during my care of the patient were reviewed by me and considered in my medical decision making (see chart for details).  65 year old male with  a history of paroxysmal atrial fibrillation who presents with palpitations and chest burning. Currently patient is at a rate between 108 to 130s and hypertensive. Will administer 10 mg IV diltiazem, obtain screening lab work including troponin, chest x-ray and reassess.  Clinical Course as of Jan 26 411  Sat Jan 26, 2016  0252 Heart rate 72 to 110s after Cardizem bolus.  [JS]  0359 Heart rate 80s to low 100s. Updated patient and spouse of laboratory results. Patient still complains of mild chest burning. Will administer aspirin, nitroglycerin and discuss with hospitalist to evaluate patient in the emergency department for admission.  [JS]    Clinical Course User Index [JS] Paulette Blanch, MD     ____________________________________________   FINAL CLINICAL  IMPRESSION(S) / ED DIAGNOSES  Final diagnoses:  Chest pain, unspecified type  Paroxysmal atrial fibrillation ([place])  Unstable angina ([place])      NEW MEDICATIONS STARTED DURING THIS VISIT:  New Prescriptions   No medications on file     Note:  This document was prepared using Dragon voice recognition software and may include unintentional dictation errors.    Paulette Blanch, MD 01/26/16 [phone]

## 2016-01-26 NOTE — H&P (Signed)
[facility] NAME: Neil Bush    MR#:  [id number]  DATE OF BIRTH:  [birth year]  DATE OF ADMISSION:  01/26/2016  PRIMARY CARE PHYSICIAN: Golden Pop, MD   REQUESTING/REFERRING PHYSICIAN:   CHIEF COMPLAINT:  No chief complaint on file.   HISTORY OF PRESENT ILLNESS: Neil Bush  is a 65 y.o. male with a known history of hypertension, hyperlipidemia presented to the emergency room with chest pain since 8 PM yesterday. Pain is located all across the chest and is dull aching in nature 6 out of 10 scale of 1-10. Patient also complains of palpitations and felt as if his heart is racing. He has history of proximal atrial fibrillation. No complaints of shortness of breath, orthopnea and proximal nocturnal dyspnea. No fever or chills or cough. No history of thyroid disease. Patient was evaluated in the emergency room first set of troponin was negative. EKG showed patient to be in atrial fibrillation with a rate of 150 bpm. Patient was given IV Cardizem 20 mg push and his heart rate came down to 102 bpm. Hospitalist service was consulted for further care of the patient. Patient had cardiac stress test couple of years ago which was a normal study.  PAST MEDICAL HISTORY:   Past Medical History:  Diagnosis Date  . Hyperlipidemia   . Hypertension     PAST SURGICAL HISTORY: Past Surgical History:  Procedure Laterality Date  . CHOLECYSTECTOMY    . EYE SURGERY    . FOOT SURGERY    . HERNIA REPAIR     X 2 ingunial  . KNEE SURGERY Right     SOCIAL HISTORY:  Social History  Substance Use Topics  . Smoking status: Never Smoker  . Smokeless tobacco: Never Used  . Alcohol use No    FAMILY HISTORY:  Family History  Problem Relation Age of Onset  . Cancer Mother   . Heart disease Father   . Diabetes Father   . Hypertension Father   . Heart disease Brother   . COPD Neg Hx   . Stroke Neg Hx     DRUG ALLERGIES:  Allergies   Allergen Reactions  . Penicillins Rash    Has patient had a PCN reaction causing immediate rash, facial/tongue/throat swelling, SOB or lightheadedness with hypotension: no Has patient had a PCN reaction causing severe rash involving mucus membranes or skin necrosis: no Has patient had a PCN reaction that required hospitalization no Has patient had a PCN reaction occurring within the last 10 years: no If all of the above answers are "NO", then may proceed with Cephalosporin use.    Childhood allergy    REVIEW OF SYSTEMS:   CONSTITUTIONAL: No fever, fatigue or weakness.  EYES: No blurred or double vision.  EARS, NOSE, AND THROAT: No tinnitus or ear pain.  RESPIRATORY: No cough, shortness of breath, wheezing or hemoptysis.  CARDIOVASCULAR: Has chest pain, palpitations No orthopnea, edema.  GASTROINTESTINAL: No nausea, vomiting, diarrhea or abdominal pain.  GENITOURINARY: No dysuria, hematuria.  ENDOCRINE: No polyuria, nocturia,  HEMATOLOGY: No anemia, easy bruising or bleeding SKIN: No rash or lesion. MUSCULOSKELETAL: No joint pain or arthritis.   NEUROLOGIC: No tingling, numbness, weakness.  PSYCHIATRY: No anxiety or depression.   MEDICATIONS AT HOME:  Prior to Admission medications   Medication Sig Start Date End Date Taking? Authorizing Provider  aspirin 81 MG tablet Take 81 mg by mouth daily.   Yes Historical Provider, MD  atorvastatin (  LIPITOR) 40 MG tablet Take 1 tablet (40 mg total) by mouth daily at 6 PM. 01/21/16  Yes Guadalupe Maple, MD  lisinopril (PRINIVIL,ZESTRIL) 10 MG tablet Take 1 tablet (10 mg total) by mouth daily. 01/21/16  Yes Guadalupe Maple, MD  metoprolol succinate (TOPROL-XL) 50 MG 24 hr tablet Take 1 tablet (50 mg total) by mouth daily. Take with or immediately following a meal. 01/21/16  Yes Guadalupe Maple, MD  POTASSIUM CHLORIDE PO Take 1 tablet by mouth daily.   Yes Historical Provider, MD      PHYSICAL EXAMINATION:   VITAL SIGNS: Blood pressure  114/89, pulse 98, temperature 97.7 F (36.5 C), temperature source Oral, resp. rate 20, weight 83.9 kg (185 lb), SpO2 95 %.  GENERAL:  65 y.o.-year-old patient lying in the bed with no acute distress.  EYES: Pupils equal, round, reactive to light and accommodation. No scleral icterus. Extraocular muscles intact.  HEENT: Head atraumatic, normocephalic. Oropharynx and nasopharynx clear.  NECK:  Supple, no jugular venous distention. No thyroid enlargement, no tenderness.  LUNGS: Normal breath sounds bilaterally, no wheezing, rales,rhonchi or crepitation. No use of accessory muscles of respiration.  CARDIOVASCULAR: S1, S2 irregular. No murmurs, rubs, or gallops.  ABDOMEN: Soft, nontender, nondistended. Bowel sounds present. No organomegaly or mass.  EXTREMITIES: No pedal edema, cyanosis, or clubbing.  NEUROLOGIC: Cranial nerves II through XII are intact. Muscle strength 5/5 in all extremities. Sensation intact. Gait not checked.  PSYCHIATRIC: The patient is alert and oriented x 3.  SKIN: No obvious rash, lesion, or ulcer.   LABORATORY PANEL:   CBC  Recent Labs Lab 01/21/16 0905 01/26/16 0209  WBC 8.5 11.2*  HGB  --  18.1*  HCT 49.7 52.2*  PLT 276 255  MCV 94 93.2  MCH 32.3 32.3  MCHC 34.4 34.6  RDW 13.7 13.6  LYMPHSABS 2.0 2.8  MONOABS  --  1.2*  EOSABS 0.1 0.3  BASOSABS 0.0 0.1   ------------------------------------------------------------------------------------------------------------------  Chemistries   Recent Labs Lab 01/21/16 0905 01/26/16 0209  NA 142 140  K 4.6 4.0  CL 100 107  CO2 20 28  GLUCOSE 95 132*  BUN 30* 32*  CREATININE 1.12 1.10  CALCIUM 9.2 9.0  AST 15 30  ALT 20 25  ALKPHOS 77 77  BILITOT 0.7 0.8   ------------------------------------------------------------------------------------------------------------------ estimated creatinine clearance is 64.6 mL/min (by C-G formula based on SCr of 1.1  mg/dL). ------------------------------------------------------------------------------------------------------------------ No results for input(s): TSH, T4TOTAL, T3FREE, THYROIDAB in the last 72 hours.  Invalid input(s): FREET3   Coagulation profile  Recent Labs Lab 01/26/16 0209  INR 0.92   ------------------------------------------------------------------------------------------------------------------- No results for input(s): DDIMER in the last 72 hours. -------------------------------------------------------------------------------------------------------------------  Cardiac Enzymes  Recent Labs Lab 01/26/16 0209  TROPONINI <0.03   ------------------------------------------------------------------------------------------------------------------ Invalid input(s): POCBNP  ---------------------------------------------------------------------------------------------------------------  Urinalysis    Component Value Date/Time   COLORURINE AMBER BIOCHEMICALS MAY BE AFFECTED BY COLOR (A) 08/25/2008 1125   APPEARANCEUR Clear 01/21/2016 0905   LABSPEC 1.045 (H) 08/25/2008 1125   PHURINE 5.0 08/25/2008 1125   GLUCOSEU Negative 01/21/2016 0905   HGBUR SMALL (A) 08/25/2008 1125   BILIRUBINUR Negative 01/21/2016 0905   KETONESUR NEGATIVE 08/25/2008 1125   PROTEINUR 1+ (A) 01/21/2016 0905   PROTEINUR 30 (A) 08/25/2008 1125   UROBILINOGEN 1.0 08/25/2008 1125   NITRITE Negative 01/21/2016 0905   NITRITE NEGATIVE 08/25/2008 1125   LEUKOCYTESUR Negative 01/21/2016 0905     RADIOLOGY: Dg Chest Port 1 View  Result Date: 01/26/2016 CLINICAL DATA:  Palpitations. Burning chest pain across the entire chest beginning earlier in the evening and then ceased. EXAM: PORTABLE CHEST 1 VIEW COMPARISON:  09/23/2008 FINDINGS: The heart size and mediastinal contours are within normal limits. Both lungs are clear. The visualized skeletal structures are unremarkable. IMPRESSION: No active  disease. Electronically Signed   By: Lucienne Capers M.D.   On: 01/26/2016 02:40    EKG: Orders placed or performed during the hospital encounter of 01/26/16  . EKG 12-Lead  . EKG 12-Lead  . ED EKG within 10 minutes  . ED EKG within 10 minutes    IMPRESSION AND PLAN: 65 year old male patient with history of hypertension, hyperlipidemia presented to the emergency room with chest pain and palpitations. Admitting diagnosis 1. Atrial fibrillation with rapid rate 2. Chest pain 3. Hypertension 4. Hyperlipidemia Treatment plan Admit patient to telemetry observation bed Aspirin 81 mg orally daily Cycle troponin to rule out ischemia Check echocardiogram Start patient on oral xarelto for anticoagulation Start patient on oral Cardizem for rate control Supportive care Cardiology consultation.  All the records are reviewed and case discussed with ED provider. Management plans discussed with the patient, family and they are in agreement.  CODE STATUS:FULL CODE    Code Status Orders        Start     Ordered   01/26/16 0439  Full code  Continuous     01/26/16 0439    Code Status History    Date Active Date Inactive Code Status Order ID Comments User Context   This patient has a current code status but no historical code status.       TOTAL TIME TAKING CARE OF THIS PATIENT: 52 minutes.    Saundra Shelling M.D on 01/26/2016 at 4:41 AM  Between 7am to 6pm - Pager - [phone]  After 6pm go to [email or web address] - password [email or web address] [facility]  Office  [phone]  CC: Primary care physician; Golden Pop, MD

## 2016-01-26 NOTE — ED Triage Notes (Signed)
Patient reports having racing heart and burning sensation off/on since 8:30pm.

## 2016-01-26 NOTE — Progress Notes (Signed)
Received a call from [place], patient converted back to Sinus Loletha Grayer with first degree heart block. Attending made aware, nno. Continue to monitor.

## 2016-01-26 NOTE — ED Notes (Signed)
Burning chest pain across entire chest that began earlier in evening then ceased. Burning sensation began again at approx 1130 tonight. Denies NV, diaphoresis. Pt speaks in complete and full sentences. Pt reports HR up to 150's at home.

## 2016-01-26 NOTE — ED Notes (Signed)
Dr Beather Arbour, ED Provider, and Daiva Nakayama, RN  at bedside at this time.

## 2016-01-26 NOTE — Consult Note (Signed)
Cardiology Consultation   Patient ID: Neil Bush; [id number]; 1951-01-03   Admit date: 01/26/2016 Date of Consult: 01/26/2016  Referring MD:  Dr. Estanislado Pandy  Patient Care Team: Guadalupe Maple, MD as PCP - General (Family Medicine) Carloyn Manner, MD as Referring Physician (Otolaryngology) Elkin, DPM as Consulting Physician (Podiatry) Birder Robson, MD as Referring Physician (Ophthalmology) Robert Bellow, MD (General Surgery) Lollie Sails, MD as Consulting Physician (Gastroenterology)    Reason for Consultation: Atrial fibrillation   History of Present Illness: Neil Bush is a 65 y.o. male with a hx of hypertension, hyperlipidemia, paroxysmal atrial fibrillation. Patient reports that the atrial fibrillation was mentioned to him may be around 15 years ago. It was around that time that he underwent workup including a Holter monitor, stress test, cardiac catheterization. Does not recall being told that he had coronary artery disease.  He would have 3-4 episodes every year of feeling his heart racing together with tightness in the chest. Usually, the symptoms last 10-15 minutes. He will feel his pulse being irregular and skipping beats. This time, the palpitations lasted the longest and therefore ended up going to the ER for further evaluation. Symptoms are mainly in the chest, nonradiating.  Currently he does not have any chest pain. Denies shortness of breath, PND, orthopnea, edema. The wife reports the patient snores and may have episodes where he stops breathing and wakes up to catch his breath.  ROS:  Please see the history of present illness.  ROS All other ROS reviewed and negative.    Past Medical History:  Diagnosis Date  . Hyperlipidemia   . Hypertension     Past Surgical History:  Procedure Laterality Date  . CHOLECYSTECTOMY    . EYE SURGERY    . FOOT SURGERY    . HERNIA REPAIR     X 2 ingunial  . KNEE SURGERY Right       Home  Meds: Prior to Admission medications   Medication Sig Start Date End Date Taking? Authorizing Provider  aspirin 81 MG tablet Take 81 mg by mouth daily.   Yes Historical Provider, MD  atorvastatin (LIPITOR) 40 MG tablet Take 1 tablet (40 mg total) by mouth daily at 6 PM. 01/21/16  Yes Guadalupe Maple, MD  lisinopril (PRINIVIL,ZESTRIL) 10 MG tablet Take 1 tablet (10 mg total) by mouth daily. 01/21/16  Yes Guadalupe Maple, MD  metoprolol succinate (TOPROL-XL) 50 MG 24 hr tablet Take 1 tablet (50 mg total) by mouth daily. Take with or immediately following a meal. 01/21/16  Yes Guadalupe Maple, MD  POTASSIUM CHLORIDE PO Take 1 tablet by mouth daily.   Yes Historical Provider, MD    Current Medications: . atorvastatin  40 mg Oral q1800  . diltiazem  30 mg Oral Q6H  . gi cocktail  30 mL Oral Q6H  . rivaroxaban  20 mg Oral Daily  . sodium chloride flush  3 mL Intravenous Q12H     Allergies:    Allergies  Allergen Reactions  . Penicillins Rash    Has patient had a PCN reaction causing immediate rash, facial/tongue/throat swelling, SOB or lightheadedness with hypotension: no Has patient had a PCN reaction causing severe rash involving mucus membranes or skin necrosis: no Has patient had a PCN reaction that required hospitalization no Has patient had a PCN reaction occurring within the last 10 years: no If all of the above answers are "NO", then may proceed with Cephalosporin use.  Childhood allergy    Social History:   The patient  reports that he has never smoked. He has never used smokeless tobacco. He reports that he does not drink alcohol or use drugs.    Family History:   The patient's family history includes Cancer in his mother; Diabetes in his father; Heart disease in his brother and father; Hypertension in his father.       PHYSICAL EXAM: VS:  BP 104/77 (BP Location: Left Arm)   Pulse 64   Temp 97.6 F (36.4 C) (Oral)   Resp 15   Wt 180 lb 3.2 oz (81.7 kg)   SpO2 95%    BMI 26.80 kg/m  , BMI Body mass index is 26.8 kg/m. GENERAL:  well developed, well nourished, not in acute distress HEENT: normocephalic, pink conjunctivae, anicteric sclerae, no xanthelasma, normal dentition, oropharynx clear NECK:  no neck vein engorgement, JVP normal, no hepatojugular reflux, carotid upstroke brisk and symmetric, no bruit, no thyromegaly, no lymphadenopathy LUNGS:  good respiratory effort, clear to auscultation bilaterally CV:  PMI not displaced, no thrills, no lifts, S2 within normal limits, no palpable S3 or S4, no murmurs, no rubs, no gallops, Irregular rhythm ABD:  Soft, nontender, nondistended, normoactive bowel sounds, no abdominal aortic bruit, no hepatomegaly, no splenomegaly MS: nontender back, no kyphosis, no scoliosis, no joint deformities EXT:  2+ DP/PT pulses, no edema, no varicosities, no cyanosis, no clubbing SKIN: warm, nondiaphoretic, normal turgor, no ulcers NEUROPSYCH: alert, oriented to person, place, and time, sensory/motor grossly intact, normal mood, appropriate affect    EKG:  01/26/2016, 2:02 AM: Atrial fibrillation with rapid ventricular response ventricular rate of 135 BPM, nonspecific ST-T wave changes - personally reviewed  Labs:  Recent Labs  01/26/16 0209 01/26/16 0815  TROPONINI <0.03 <0.03   Lab Results  Component Value Date   WBC 11.2 (H) 01/26/2016   HGB 18.1 (H) 01/26/2016   HCT 52.2 (H) 01/26/2016   MCV 93.2 01/26/2016   PLT 255 01/26/2016    Recent Labs Lab 01/26/16 0209 01/26/16 0815  NA 140 138  K 4.0 4.3  CL 107 107  CO2 28 27  BUN 32* 27*  CREATININE 1.10 1.02  CALCIUM 9.0 8.7*  PROT 7.3  --   BILITOT 0.8  --   ALKPHOS 77  --   ALT 25  --   AST 30  --   GLUCOSE 132* 111*   Lab Results  Component Value Date   CHOL 162 01/21/2016   HDL 44 01/21/2016   LDLCALC 88 01/21/2016   TRIG 149 01/21/2016   Lab Results  Component Value Date   DDIMER (H) 09/23/2008    0.82        AT THE INHOUSE  ESTABLISHED CUTOFF VALUE OF 0.48 ug/mL FEU, THIS ASSAY HAS BEEN DOCUMENTED IN THE LITERATURE TO HAVE A SENSITIVITY AND NEGATIVE PREDICTIVE VALUE OF AT LEAST 98 TO 99%.  THE TEST RESULT SHOULD BE CORRELATED WITH AN ASSESSMENT OF THE CLINICAL PROBABILITY OF DVT / VTE.    Radiology/Studies:  Dg Chest Port 1 View  Result Date: 01/26/2016 CLINICAL DATA:  Palpitations. Burning chest pain across the entire chest beginning earlier in the evening and then ceased. EXAM: PORTABLE CHEST 1 VIEW COMPARISON:  09/23/2008 FINDINGS: The heart size and mediastinal contours are within normal limits. Both lungs are clear. The visualized skeletal structures are unremarkable. IMPRESSION: No active disease. Electronically Signed   By: Lucienne Capers M.D.   On: 01/26/2016 02:40  PROBLEM LIST:  Principal Problem:   Chest pain Active Problems:   A-fib (HCC)     ASSESSMENT AND PLAN:  Atrial fibrillation, paroxysmal Ventricular rate improving Agree with diltiazem. Up titrate dose as needed for heart rate less than 100 bpm. CHADS2-VASc=2. Agree with oral anticoagulation. Awaiting echo. Recommend outpatient sleep study. Patient has snoring and possible apneic episodes.  Chest pain ACS ruled out Likely from fast heart rates. Patient mentions other episodes where he would have sudden fatigue, diaphoresis. His cardiac workup has been over 10 years ago. We'll recommend outpatient nuclear stress testing.  Hypertension BP is well controlled. Continue monitoring BP. Continue current medical therapy and lifestyle changes.  I had a lengthy and detailed discussion with the patient regarding the diagnosis of atrial fibrillation. We discussed the pathophysiology, rate and rhythm control, stroke risk and stroke risk reduction with oral anticoagulation. Patient verbalized understanding and agreed with plan.  I spent at least 68minutes with the patient today and more than 50% of the time was spent counseling  the patient and coordinating care.   Signed, Wende Bushy, MD , [facility] 01/26/2016 12:05 PM  [place]

## 2016-01-26 NOTE — Progress Notes (Signed)
Patient admitted to room 238 with the diagnosis of chest pain. Alert and oriented x 4. Patient stated his pain level was 4/10 on a pain scale but refused to take any pain medication. Cardiac box called to CCMD, box # 02 with Merry Proud NT as a second verifier. Skin assessment done with Sharyn Lull RN, skin is intact but dry and flaky. Wife is at the bedside voiced no concerns. Will continue to monitor.

## 2016-01-27 DIAGNOSIS — R079 Chest pain, unspecified: Secondary | ICD-10-CM | POA: Diagnosis not present

## 2016-01-27 DIAGNOSIS — I351 Nonrheumatic aortic (valve) insufficiency: Secondary | ICD-10-CM | POA: Diagnosis not present

## 2016-01-27 DIAGNOSIS — I480 Paroxysmal atrial fibrillation: Secondary | ICD-10-CM | POA: Diagnosis not present

## 2016-01-27 DIAGNOSIS — E785 Hyperlipidemia, unspecified: Secondary | ICD-10-CM | POA: Diagnosis not present

## 2016-01-27 DIAGNOSIS — I4891 Unspecified atrial fibrillation: Secondary | ICD-10-CM | POA: Diagnosis not present

## 2016-01-27 DIAGNOSIS — I10 Essential (primary) hypertension: Secondary | ICD-10-CM | POA: Diagnosis not present

## 2016-01-27 LAB — ECHOCARDIOGRAM COMPLETE
CHL CUP MV DEC (S): 264
E decel time: 264 msec
E/e' ratio: 5.42
FS: 26 % — AB (ref 28–44)
IVS/LV PW RATIO, ED: 0.97
LA ID, A-P, ES: 43 mm
LA diam index: 2.14 cm/m2
LAVOL: 49.5 mL
LAVOLA4C: 42.2 mL
LAVOLIN: 24.7 mL/m2
LEFT ATRIUM END SYS DIAM: 43 mm
LV E/e' medial: 5.42
LV E/e'average: 5.42
LV PW d: 9.53 mm — AB (ref 0.6–1.1)
LVELAT: 7.07 cm/s
MVPKAVEL: 48.7 m/s
MVPKEVEL: 38.3 m/s
RV LATERAL S' VELOCITY: 10.8 cm/s
TAPSE: 24.4 mm
TDI e' lateral: 7.07
TDI e' medial: 4.57
Weight: 2883.2 oz

## 2016-01-27 MED ORDER — RIVAROXABAN 20 MG PO TABS: 20.0000 mg | ORAL_TABLET | Freq: Every day | ORAL | 2 refills | Status: DC

## 2016-01-27 MED ORDER — DILTIAZEM HCL ER COATED BEADS 120 MG PO CP24: 120.0000 mg | ORAL_CAPSULE | Freq: Every day | ORAL | 0 refills | Status: DC

## 2016-01-27 NOTE — Discharge Summary (Signed)
[place] [facility] [place], 65 y.o., DOB 01/06/1951, MRN [id number]. Admission date: 01/26/2016 Discharge Date 01/27/2016 Primary MD Golden Pop, MD Admitting Physician Saundra Shelling, MD  Admission Diagnosis  Unstable angina [facility]) [I20.0] Paroxysmal atrial fibrillation (HCC) [I48.0] Chest pain, unspecified type [R07.9]  Discharge Diagnosis   Principal Problem:   Chest pain non cardiac   Paroxysmal atrial fibrillation [facility])   Nonrheumatic aortic valve insufficiency   Hyperlipedemia   Hypertention        Hospital Course  Neil Bush  is a 65 y.o. male with a known history of hypertension, hyperlipidemia presented to the emergency room with chest pain since 8 PM yesterday. Pain is located all across the chest and is dull aching in nature 6 out of 10 scale of 1-10. Patient also complains of palpitations and felt as if his heart is racing.  patient was seen in the ED and was noted to have A. fib with RVR therefore he was admitted for further evaluation. He also had chest pain. Patient was started on Cardizem orally with his heart rate converting to normal sinus rhythm. He was started on Xarelto. He was seen by cardiology and they felt that his chest pain was atypical. However he will need outpatient stress test. He is doing much better and is stable but cardiology be discharged home.             Consults  cardiology  Significant Tests:  See full reports for all details     Dg Chest Port 1 View  Result Date: 01/26/2016 CLINICAL DATA:  Palpitations. Burning chest pain across the entire chest beginning earlier in the evening and then ceased. EXAM: PORTABLE CHEST 1 VIEW COMPARISON:  09/23/2008 FINDINGS: The heart size and mediastinal contours are within normal limits. Both lungs are clear. The visualized skeletal structures are unremarkable. IMPRESSION: No active disease. Electronically Signed   By: Lucienne Capers M.D.   On: 01/26/2016  02:40       Today   Subjective:   Neil Bush  feels well denies any chest pain or shortness of breath  Objective:   Blood pressure 132/71, pulse (!) 57, temperature 97.8 F (36.6 C), temperature source Oral, resp. rate 15, weight 180 lb 3.2 oz (81.7 kg), SpO2 95 %.  .  Intake/Output Summary (Last 24 hours) at 01/27/16 1638 Last data filed at 01/27/16 0900  Gross per 24 hour  Intake              480 ml  Output                1 ml  Net              479 ml    Exam VITAL SIGNS: Blood pressure 132/71, pulse (!) 57, temperature 97.8 F (36.6 C), temperature source Oral, resp. rate 15, weight 180 lb 3.2 oz (81.7 kg), SpO2 95 %.  GENERAL:  65 y.o.-year-old patient lying in the bed with no acute distress.  EYES: Pupils equal, round, reactive to light and accommodation. No scleral icterus. Extraocular muscles intact.  HEENT: Head atraumatic, normocephalic. Oropharynx and nasopharynx clear.  NECK:  Supple, no jugular venous distention. No thyroid enlargement, no tenderness.  LUNGS: Normal breath sounds bilaterally, no wheezing, rales,rhonchi or crepitation. No use of accessory muscles of respiration.  CARDIOVASCULAR: S1, S2 normal. No murmurs, rubs, or gallops.  ABDOMEN: Soft, nontender, nondistended. Bowel sounds present. No organomegaly or mass.  EXTREMITIES: No pedal  edema, cyanosis, or clubbing.  NEUROLOGIC: Cranial nerves II through XII are intact. Muscle strength 5/5 in all extremities. Sensation intact. Gait not checked.  PSYCHIATRIC: The patient is alert and oriented x 3.  SKIN: No obvious rash, lesion, or ulcer.   Data Review     CBC w Diff: Lab Results  Component Value Date   WBC 11.2 (H) 01/26/2016   HGB 18.1 (H) 01/26/2016   HGB 16.6 10/30/2011   HCT 52.2 (H) 01/26/2016   HCT 49.7 01/21/2016   PLT 255 01/26/2016   PLT 276 01/21/2016   LYMPHOPCT 25 01/26/2016   LYMPHOPCT 25.2 10/30/2011   MONOPCT 11 01/26/2016   MONOPCT 7.2 10/30/2011   EOSPCT 3 01/26/2016    EOSPCT 1.9 10/30/2011   BASOPCT 1 01/26/2016   BASOPCT 1.1 10/30/2011   CMP: Lab Results  Component Value Date   NA 138 01/26/2016   NA 142 01/21/2016   NA 140 10/30/2011   K 4.3 01/26/2016   K 4.3 10/30/2011   CL 107 01/26/2016   CL 106 10/30/2011   CO2 27 01/26/2016   CO2 28 10/30/2011   BUN 27 (H) 01/26/2016   BUN 30 (H) 01/21/2016   BUN 25 (H) 10/30/2011   CREATININE 1.02 01/26/2016   CREATININE 1.05 10/30/2011   PROT 7.3 01/26/2016   PROT 7.1 01/21/2016   ALBUMIN 4.3 01/26/2016   ALBUMIN 4.5 01/21/2016   BILITOT 0.8 01/26/2016   BILITOT 0.7 01/21/2016   ALKPHOS 77 01/26/2016   AST 30 01/26/2016   AST 26 06/26/2015   ALT 25 01/26/2016   ALT 30 06/26/2015  .  Micro Results Recent Results (from the past 240 hour(s))  Microscopic Examination     Status: None   Collection Time: 01/21/16  9:05 AM  Result Value Ref Range Status   WBC, UA None seen 0 - 5 /hpf Final   RBC, UA 0-2 0 - 2 /hpf Final   Epithelial Cells (non renal) 0-10 0 - 10 /hpf Final   Casts Present None seen /lpf Final   Cast Type Hyaline casts N/A Final     Code Status History    Date Active Date Inactive Code Status Order ID Comments User Context   01/26/2016  4:39 AM 01/27/2016  2:59 PM Full Code [id number]  Saundra Shelling, MD ED          Follow-up [place], MD Follow up in 10 day(s).   Specialty:  Family Medicine Contact information: [place] [address] [phone]        Ida Rogue, MD Follow up in 4 day(s).   Specialty:  Cardiology Why:  for stress test was in hospital  Contact information: [place] [address] [phone]           Discharge Medications     Medication List    STOP taking these medications   aspirin 81 MG tablet   lisinopril 10 MG tablet Commonly known as:  PRINIVIL,ZESTRIL   metoprolol succinate 50 MG 24 hr tablet Commonly known as:  TOPROL-XL     TAKE these  medications   atorvastatin 40 MG tablet Commonly known as:  LIPITOR Take 1 tablet (40 mg total) by mouth daily at 6 PM.   diltiazem 120 MG 24 hr capsule Commonly known as:  CARDIZEM CD Take 1 capsule (120 mg total) by mouth daily.   POTASSIUM CHLORIDE PO Take 1 tablet by mouth daily.  rivaroxaban 20 MG Tabs tablet Commonly known as:  XARELTO Take 1 tablet (20 mg total) by mouth daily.          Total Time in preparing paper work, data evaluation and todays exam - 35 minutes  Dustin Flock M.D on 01/27/2016 at 4:38 PM  [facility]   Office  [phone]

## 2016-01-27 NOTE — Care Management Obs Status (Signed)
[place] NOTIFICATION   Patient Details  Name: GOTTFRIED GIONFRIDDO MRN: [id number] Date of Birth: 12/16/1950   Medicare Observation Status Notification Given:  Yes    Ival Bible, RN 01/27/2016, 11:04 AM

## 2016-01-27 NOTE — Progress Notes (Signed)
Patient Name: Neil Bush Date of Encounter: 01/27/2016  Primary Cardiologist: none previously, Dr. Doy Mince Problem List     Principal Problem:   Chest pain Active Problems:   Paroxysmal atrial fibrillation (HCC)   Nonrheumatic aortic valve insufficiency     Subjective   Patient is doing better. He actually felt much better when he converted to sinus rhythm. No chest pain. Patient is looking for to going home today.  Inpatient Medications    Scheduled Meds: . atorvastatin  40 mg Oral q1800  . diltiazem  30 mg Oral Q6H  . rivaroxaban  20 mg Oral Daily  . sodium chloride flush  3 mL Intravenous Q12H   Continuous Infusions:  PRN Meds: sodium chloride, acetaminophen, nitroGLYCERIN, ondansetron (ZOFRAN) IV, sodium chloride flush   Vital Signs    Vitals:   01/26/16 1234 01/26/16 1743 01/26/16 1918 01/27/16 0818  BP: 96/65 114/69 108/64 132/71  Pulse: 76 (!) 58 67 (!) 57  Resp: 12  15   Temp: 97.5 F (36.4 C)  98.5 F (36.9 C) 97.8 F (36.6 C)  TempSrc: Oral  Oral Oral  SpO2: 96%  96% 95%  Weight:        Intake/Output Summary (Last 24 hours) at 01/27/16 0921 Last data filed at 01/27/16 0630  Gross per 24 hour  Intake              600 ml  Output                1 ml  Net              599 ml   Filed Weights   01/26/16 0203 01/26/16 0600  Weight: 185 lb (83.9 kg) 180 lb 3.2 oz (81.7 kg)   Body mass index is 26.8 kg/m.  Physical Exam  GENERAL:  well developed, well nourished, not in acute distress HEENT: normocephalic, pink conjunctivae, anicteric sclerae, no xanthelasma, normal dentition, oropharynx clear NECK:  no neck vein engorgement, JVP normal, no hepatojugular reflux, carotid upstroke brisk and symmetric, no bruit, no thyromegaly, no lymphadenopathy LUNGS:  good respiratory effort, clear to auscultation bilaterally CV:  PMI not displaced, no thrills, no lifts, S1 and S2 within normal limits, no palpable S3 or S4, no murmurs, no rubs, no  gallops ABD:  Soft, nontender, nondistended, normoactive bowel sounds, no abdominal aortic bruit, no hepatomegaly, no splenomegaly MS: nontender back, no kyphosis, no scoliosis, no joint deformities EXT:  2+ DP/PT pulses, no edema, no varicosities, no cyanosis, no clubbing SKIN: warm, nondiaphoretic, normal turgor, no ulcers NEUROPSYCH: alert, oriented to person, place, and time, sensory/motor grossly intact, normal mood, appropriate affect    Labs    CBC  Recent Labs  01/26/16 0209  WBC 11.2*  NEUTROABS 6.7*  HGB 18.1*  HCT 52.2*  MCV 93.2  PLT 123456   Basic Metabolic Panel  Recent Labs  01/26/16 0209 01/26/16 0815  NA 140 138  K 4.0 4.3  CL 107 107  CO2 28 27  GLUCOSE 132* 111*  BUN 32* 27*  CREATININE 1.10 1.02  CALCIUM 9.0 8.7*   Liver Function Tests  Recent Labs  01/26/16 0209  AST 30  ALT 25  ALKPHOS 77  BILITOT 0.8  PROT 7.3  ALBUMIN 4.3   No results for input(s): LIPASE, AMYLASE in the last 72 hours. Cardiac Enzymes  Recent Labs  01/26/16 0815 01/26/16 1410 01/26/16 1950  TROPONINI <0.03 <0.03 <0.03   BNP Invalid input(s): POCBNP D-Dimer  No results for input(s): DDIMER in the last 72 hours. Hemoglobin A1C No results for input(s): HGBA1C in the last 72 hours. Fasting Lipid Panel No results for input(s): CHOL, HDL, LDLCALC, TRIG, CHOLHDL, LDLDIRECT in the last 72 hours. Thyroid Function Tests  Recent Labs  01/26/16 0815  TSH 1.331    Telemetry    Converted to SR ~ 7pm - Personally Reviewed  ECG    01/26/2016, 2:02 AM: Atrial fibrillation with rapid ventricular response ventricular rate of 135 BPM, nonspecific ST-T wave changes - personally reviewed  Radiology    Dg Chest Port 1 View  Result Date: 01/26/2016 CLINICAL DATA:  Palpitations. Burning chest pain across the entire chest beginning earlier in the evening and then ceased. EXAM: PORTABLE CHEST 1 VIEW COMPARISON:  09/23/2008 FINDINGS: The heart size and mediastinal  contours are within normal limits. Both lungs are clear. The visualized skeletal structures are unremarkable. IMPRESSION: No active disease. Electronically Signed   By: Lucienne Capers M.D.   On: 01/26/2016 02:40    Cardiac Studies   01/27/2016: Left ventricle: The cavity size was normal. Wall thickness was   normal. Systolic function was normal. The estimated ejection   fraction was in the range of 55% to 60%. Left ventricular   diastolic function parameters were normal for the patient&'s age. - Aortic valve: There was trivial regurgitation.  Patient Profile     65 year old male with hx of hypertension, hyperlipidemia, paroxysmal atrial fibrillation, presented with atrial fibrillation with rapid ventricular response.  Assessment & Plan    Atrial fibrillation, paroxysmal Spontaneously converted to SR last night Check 12-lead EKG Rec to switch diltiazem to diltiazem CD 120 mg by mouth daily on discharge.Most likely will start tomorrow. CHADS2-VASc=2. Agree with oral anticoagulation. Continue  Xarelto. Echo reviewed with patient, LVEF within normal limits. Recommend outpatient sleep study. Patient has snoring and possible apneic episodes.  Chest pain ACS ruled out Likely from fast heart rates. Patient mentions other episodes where he would have sudden fatigue, diaphoresis. His cardiac workup has been over 10 years ago. We'll recommend outpatient nuclear stress testing.  Hypertension BP is well controlled. Continue monitoring BP. Continue current medical therapy and lifestyle changes. Patient was not receiving his lisinopril here in the hospital. His blood pressure has been in the normal range. Advised the patient to hold this for now, we will reassess the need for lisinopril on follow-up.  Aortic regurgitation Trivial AI noted on echo. Recommend outpatient serial echo.  Recommend follow-up outpatient cardiology in one to 2 weeks post discharge.  I spent at least 35 minutes  with the patient today and more than 50% of the time was spent counseling the patient and coordinating care.    Signed, Wende Bushy, MD  01/27/2016, 9:21 AM

## 2016-01-27 NOTE — Progress Notes (Signed)
Discharge instruction given and explained to patient as well as wife at bedside, verbalized understanding. EKG obtained as ordered in chart. Patient was wheeled out, no distress noted.

## 2016-01-27 NOTE — Discharge Instructions (Signed)
[place] [facility] [place]:  Cardiac diet  DISCHARGE CONDITION:  Stable  ACTIVITY:  Activity as tolerated  OXYGEN:  Home Oxygen: No.   Oxygen Delivery: room air  DISCHARGE LOCATION:  home    ADDITIONAL DISCHARGE INSTRUCTION: check bp on daily basis take log to primary md   If you experience worsening of your admission symptoms, develop shortness of breath, life threatening emergency, suicidal or homicidal thoughts you must seek medical attention immediately by calling 911 or calling your MD immediately  if symptoms less severe.  You Must read complete instructions/literature along with all the possible adverse reactions/side effects for all the Medicines you take and that have been prescribed to you. Take any new Medicines after you have completely understood and accpet all the possible adverse reactions/side effects.   Please note  You were cared for by a hospitalist during your hospital stay. If you have any questions about your discharge medications or the care you received while you were in the hospital after you are discharged, you can call the unit and asked to speak with the hospitalist on call if the hospitalist that took care of you is not available. Once you are discharged, your primary care physician will handle any further medical issues. Please note that NO REFILLS for any discharge medications will be authorized once you are discharged, as it is imperative that you return to your primary care physician (or establish a relationship with a primary care physician if you do not have one) for your aftercare needs so that they can reassess your need for medications and monitor your lab values.

## 2016-01-28 ENCOUNTER — Encounter (INDEPENDENT_AMBULATORY_CARE_PROVIDER_SITE_OTHER): Payer: PPO | Admitting: Podiatry

## 2016-01-28 ENCOUNTER — Telehealth: Payer: Self-pay | Admitting: Cardiology

## 2016-01-28 NOTE — Telephone Encounter (Signed)
Patient contacted regarding discharge from [facility] on 01/27/16.  Patient understands to follow up with provider In[place] on 12/6 at 2pm at [facility]. Patient understands discharge instructions? yesy Patient understands medications and regiment? yes Patient understands to bring all medications to this visit? yes  Provided directions to our office. Pt verbalized understanding.

## 2016-01-28 NOTE — Progress Notes (Signed)
This encounter was created in error - please disregard.

## 2016-02-06 ENCOUNTER — Encounter: Payer: Self-pay | Admitting: Cardiology

## 2016-02-06 ENCOUNTER — Ambulatory Visit (INDEPENDENT_AMBULATORY_CARE_PROVIDER_SITE_OTHER): Payer: PPO | Admitting: Cardiology

## 2016-02-06 VITALS — BP 168/101 | HR 58 | Ht 70.0 in | Wt 187.8 lb

## 2016-02-06 DIAGNOSIS — I351 Nonrheumatic aortic (valve) insufficiency: Secondary | ICD-10-CM

## 2016-02-06 DIAGNOSIS — I10 Essential (primary) hypertension: Secondary | ICD-10-CM | POA: Diagnosis not present

## 2016-02-06 DIAGNOSIS — I480 Paroxysmal atrial fibrillation: Secondary | ICD-10-CM | POA: Diagnosis not present

## 2016-02-06 NOTE — Progress Notes (Signed)
Cardiology Office Note   Date:  02/06/2016   ID:  PAXTON CARLONE, DOB [birth year], MRN [id number]  Referring Doctor:  Golden Pop, MD   Cardiologist:   Wende Bushy, MD   Reason for consultation:  Chief Complaint  Patient presents with  . OTHER    Chest pain and afib. Meds reviewed verbally with pt.      History of Present Illness: Neil Bush is a 65 y.o. male who has history of hypertension, hyperlipidemia, paroxysmal atrial fibrillation, presented with atrial fibrillation with rapid ventricular response 01/26/2016, here now for hospital follow-up for moderate complexity  Patient has been tolerating medications well. No bruising or bleeding. No falls.  Patient had one episode when he felt his heart racing. No recurrence since then. No chest pain. No shortness of breath.  He has noticed that he has a headache when he wakes up in the morning. He checks his blood pressure in the evenings and they have been within the normal limits.   ROS:  Please see the history of present illness. Aside from mentioned under HPI, all other systems are reviewed and negative.     Past Medical History:  Diagnosis Date  . Hyperlipidemia   . Hypertension     Past Surgical History:  Procedure Laterality Date  . CARDIAC CATHETERIZATION     [facility]   . CHOLECYSTECTOMY    . EYE SURGERY    . FOOT SURGERY    . HERNIA REPAIR     X 2 ingunial  . KNEE SURGERY Right      reports that he has never smoked. He has never used smokeless tobacco. He reports that he does not drink alcohol or use drugs.   family history includes Cancer in his mother; Diabetes in his father; Heart disease in his brother and father; Hypertension in his father.   Outpatient Medications Prior to Visit  Medication Sig Dispense Refill  . atorvastatin (LIPITOR) 40 MG tablet Take 1 tablet (40 mg total) by mouth daily at 6 PM. 90 tablet 4  . diltiazem (CARDIZEM CD) 120 MG 24 hr capsule Take 1 capsule (120 mg total) by  mouth daily. 30 capsule 0  . POTASSIUM CHLORIDE PO Take 1 tablet by mouth daily.    . rivaroxaban (XARELTO) 20 MG TABS tablet Take 1 tablet (20 mg total) by mouth daily. 30 tablet 2   No facility-administered medications prior to visit.      Allergies: Penicillins    PHYSICAL EXAM: VS:  BP (!) 168/101 (BP Location: Left Arm, Patient Position: Sitting, Cuff Size: Normal)   Pulse (!) 58   Ht 5\' 10"  (1.778 m)   Wt 187 lb 12 oz (85.2 kg)   BMI 26.94 kg/m  , Body mass index is 26.94 kg/m. Wt Readings from Last 3 Encounters:  02/06/16 187 lb 12 oz (85.2 kg)  01/26/16 180 lb 3.2 oz (81.7 kg)  01/21/16 181 lb (82.1 kg)    GENERAL:  well developed, well nourished, not in acute distress HEENT: normocephalic, pink conjunctivae, anicteric sclerae, no xanthelasma, normal dentition, oropharynx clear NECK:  no neck vein engorgement, JVP normal, no hepatojugular reflux, carotid upstroke brisk and symmetric, no bruit, no thyromegaly, no lymphadenopathy LUNGS:  good respiratory effort, clear to auscultation bilaterally CV:  PMI not displaced, no thrills, no lifts, S1 and S2 within normal limits, no palpable S3 or S4, no murmurs, no rubs, no gallops ABD:  Soft, nontender, nondistended, normoactive bowel sounds, no abdominal aortic bruit,  no hepatomegaly, no splenomegaly MS: nontender back, no kyphosis, no scoliosis, no joint deformities EXT:  2+ DP/PT pulses, no edema, no varicosities, no cyanosis, no clubbing SKIN: warm, nondiaphoretic, normal turgor, no ulcers NEUROPSYCH: alert, oriented to person, place, and time, sensory/motor grossly intact, normal mood, appropriate affect  Recent Labs: 01/26/2016: ALT 25; BUN 27; Creatinine, Ser 1.02; Hemoglobin 18.1; Platelets 255; Potassium 4.3; Sodium 138; TSH 1.331   Lipid Panel    Component Value Date/Time   CHOL 162 01/21/2016 0905   CHOL 181 06/26/2015 0817   TRIG 149 01/21/2016 0905   TRIG 200 (H) 06/26/2015 0817   HDL 44 01/21/2016 0905    CHOLHDL 3.7 01/21/2016 0905   VLDL 40 (H) 06/26/2015 0817   LDLCALC 88 01/21/2016 0905     Other studies Reviewed:  EKG:  01/26/2016, 2:02 AM: Atrial fibrillation with rapid ventricular response ventricular rate of 135 BPM, nonspecific ST-T wave changes -personally reviewed  EKG from 02/06/2016 was personally reviewed by me showed sinus rhythm, 61 BPM. Nonspecific ST-T wave changes.  Additional studies/ records that were reviewed personally reviewed by me today include: Echo 01/27/2016: Left ventricle: The cavity size was normal. Wall thickness was normal. Systolic function was normal. The estimated ejection fraction was in the range of 55% to 60%. Left ventricular diastolic function parameters were normal for the patient&'s age. - Aortic valve: There was trivial regurgitation.    ASSESSMENT AND PLAN: Atrial fibrillation, paroxysmal Sinus rhythm today. CHADS2-VASc=2. Agree with oral anticoagulation. Continue  Xarelto. Continue Cardizem CD 120 mg by mouth daily. If patient has more episodes of palpitations, consider event monitor or Holter. Echo reviewed with patient, LVEF within normal limits. Recommend outpatient sleep study. Patient has snoring and possible apneic episodes. Patient to check in with PCP about this.  Chest pain in the hospital ACS ruled out Likely from fast heart rates. Patient mentions other episodes where he would have sudden fatigue, diaphoresis. His cardiac workup has been over 10 years ago.  Recommend exercise of her stress test.  Hypertension Recommend to check blood pressure in the morning. It may be elevated. If the numbers are up, may resume lisinopril 5mg  po qd and uptitrate if needed.  Aortic regurgitation Trivial AI noted on echo. Recommend outpatient serial echo.   Current medicines are reviewed at length with the patient today.  The patient does not have concerns regarding medicines.  Labs/ tests ordered today include:  Orders  Placed This Encounter  Procedures  . NM Myocar Multi W/Spect W/Wall Motion / EF  . EKG 12-Lead    I had a lengthy and detailed discussion with the patient regarding diagnoses, prognosis, diagnostic options, treatment options , and side effects of medications.   I counseled the patient on importance of lifestyle modification including heart healthy diet, regular physical activity.   Disposition:   FU with undersigned after tests 35months   Signed, Wende Bushy, MD  02/06/2016 3:26 PM    [place]  This note was generated in part with voice recognition software and I apologize for any typographical errors that were not detected and corrected.

## 2016-02-06 NOTE — Patient Instructions (Addendum)
Medication Instructions:  1. Resume Lisinopril 5 mg once daily and if blood pressure remains greater than 140/80 increase to 10 mg once daily.   Medication Samples have been provided to the patient.  Drug name: Xarelto     Strength: 20mg         Qty: 4 bottles LOT: [id number] Exp.Date: 02-20   Testing/Procedures: [place]  Your caregiver has ordered a Stress Test with nuclear imaging. The purpose of this test is to evaluate the blood supply to your heart muscle. This procedure is referred to as a "Non-Invasive Stress Test." This is because other than having an IV started in your vein, nothing is inserted or "invades" your body. Cardiac stress tests are done to find areas of poor blood flow to the heart by determining the extent of coronary artery disease (CAD). Some patients exercise on a treadmill, which naturally increases the blood flow to your heart, while others who are  unable to walk on a treadmill due to physical limitations have a pharmacologic/chemical stress agent called Lexiscan . This medicine will mimic walking on a treadmill by temporarily increasing your coronary blood flow.   Please note: these test may take anywhere between 2-4 hours to complete  PLEASE REPORT TO [place] AT THE FIRST DESK WILL DIRECT YOU WHERE TO GO  Date of Procedure:_Friday February 15, 2016 at 07:30AM __  Arrival Time for Procedure:_Arrive at 07:15AM to register__    PLEASE NOTIFY THE OFFICE AT LEAST 24 HOURS IN ADVANCE IF YOU ARE UNABLE TO [facility].  [phone] AND  PLEASE NOTIFY NUCLEAR MEDICINE AT [facility] AT LEAST 24 HOURS IN ADVANCE IF YOU ARE UNABLE TO KEEP YOUR APPOINTMENT. [phone]  How to prepare for your Myoview test:  1. Do not eat or drink after midnight 2. No caffeine for 24 hours prior to test 3. No smoking 24 hours prior to test. 4. Your medication may be taken with water.  If your doctor stopped a medication because of this test,  do not take that medication. 5. Ladies, please do not wear dresses.  Skirts or pants are appropriate. Please wear a short sleeve shirt. 6. No perfume, cologne or lotion. 7. Wear comfortable walking shoes. No heels!       Your physician has requested that you regularly monitor and record your blood pressure readings at home. Please use the same machine at the same time of day to check your readings and record them to bring to your follow-up visit.     Follow-Up: Your physician recommends that you schedule a follow-up appointment in: 4 months with Dr. Yvone Neu.  Follow up with your primary care provider to discuss a possible sleep study.   It was a pleasure seeing you today here in the office. Please do not hesitate to give [place] a call back if you have any further questions. [place], BSN    Cardiac Nuclear Scanning A cardiac nuclear scan is used to check your heart for problems, such as the following:  A portion of the heart is not getting enough blood.  Part of the heart muscle has died, which happens with a heart attack.  The heart wall is not working normally.  In this test, a radioactive dye (tracer) is injected into your bloodstream. After the tracer has traveled to your heart, a scanning device is used to measure how much of the tracer is absorbed by or distributed to various areas of your heart.  LET [facility] CARE PROVIDER KNOW ABOUT:  Any allergies you have.  All medicines you are taking, including vitamins, herbs, eye drops, creams, and over-the-counter medicines.  Previous problems you or members of your family have had with the use of anesthetics.  Any blood disorders you have.  Previous surgeries you have had.  Medical conditions you have.  RISKS AND COMPLICATIONS Generally, this is a safe procedure. However, as with any procedure, problems can occur. Possible problems include:   Serious chest pain.  Rapid heartbeat.  Sensation of  warmth in your chest. This usually passes quickly. BEFORE THE PROCEDURE Ask your health care provider about changing or stopping your regular medicines. PROCEDURE This procedure is usually done at a hospital and takes 2-4 hours.  An IV tube is inserted into one of your veins.  Your health care provider will inject a small amount of radioactive tracer through the tube.  You will then wait for 20-40 minutes while the tracer travels through your bloodstream.  You will lie down on an exam table so images of your heart can be taken. Images will be taken for about 15-20 minutes.  You will exercise on a treadmill or stationary bike. While you exercise, your heart activity will be monitored with an electrocardiogram (ECG), and your blood pressure will be checked.  If you are unable to exercise, you may be given a medicine to make your heart beat faster.  When blood flow to your heart has peaked, tracer will again be injected through the IV tube.  After 20-40 minutes, you will get back on the exam table and have more images taken of your heart.  When the procedure is over, your IV tube will be removed. AFTER THE PROCEDURE  You will likely be able to leave shortly after the test. Unless your health care provider tells you otherwise, you may return to your normal schedule, including diet, activities, and medicines.  Make sure you find out how and when you will get your test results. This information is not intended to replace advice given to you by your health care provider. Make sure you discuss any questions you have with your health care provider. Document Released: 03/14/2004 Document Revised: 02/22/2013 Document Reviewed: 01/26/2013 Elsevier Interactive Patient Education  2017 [employer].

## 2016-02-11 ENCOUNTER — Telehealth: Payer: Self-pay | Admitting: Cardiology

## 2016-02-11 MED ORDER — LISINOPRIL 10 MG PO TABS: 10.0000 mg | ORAL_TABLET | Freq: Every day | ORAL | 3 refills | Status: DC

## 2016-02-11 NOTE — Telephone Encounter (Signed)
Need to verify with Dr. Rockey Situ medication Lisinopril was resumed by Dr. Yvone Neu but was not placed on medication list.

## 2016-02-11 NOTE — Telephone Encounter (Signed)
Spoke with Neil Bush regarding his Lisinopril 5 mg one tablet daily dose, he states his blood pressure readings have been running around 140/85 most days and since then, he has increased the Lisinopril to 10 mg daily with now blood pressures running 124/78 and heart rate of 67.  Dr. Rockey Situ gave a verbal Rx of Lisinopril 10 mg take one tablet daily.  New Rx sent to [facility].

## 2016-02-11 NOTE — Telephone Encounter (Signed)
°*  STAT* If patient is at the pharmacy, call can be transferred to refill team.   1. Which medications need to be refilled? (please list name of each medication and dose if known) Lisinopril 10 mg po daily   2. Which pharmacy/location (including street and city if local pharmacy) is medication to be sent to? [address]   3. Do they need a 30 day or 90 day supply? [place]

## 2016-02-14 ENCOUNTER — Telehealth: Payer: Self-pay | Admitting: Cardiology

## 2016-02-14 NOTE — Telephone Encounter (Signed)
Spoke with patient and reviewed instructions for his stress test along with time and location that is scheduled tomorrow. He verbalized understanding and had no further questions at this time.

## 2016-02-15 ENCOUNTER — Encounter
Admission: RE | Admit: 2016-02-15 | Discharge: 2016-02-15 | Disposition: A | Discharge status: Home / Self Care | Payer: PPO | Source: Ambulatory Visit | Attending: Cardiology | Admitting: Cardiology

## 2016-02-15 DIAGNOSIS — I10 Essential (primary) hypertension: Secondary | ICD-10-CM | POA: Diagnosis not present

## 2016-02-15 DIAGNOSIS — I480 Paroxysmal atrial fibrillation: Secondary | ICD-10-CM | POA: Diagnosis not present

## 2016-02-15 LAB — NM MYOCAR MULTI W/SPECT W/WALL MOTION / EF
CHL CUP NUCLEAR SSS: 1
CHL CUP RESTING HR STRESS: 64 {beats}/min
CHL CUP STRESS STAGE 1 GRADE: 0 %
CHL CUP STRESS STAGE 1 HR: 80 {beats}/min
CHL CUP STRESS STAGE 1 SPEED: 0 mph
CHL CUP STRESS STAGE 2 HR: 80 {beats}/min
CHL CUP STRESS STAGE 3 SBP: 178 mmHg
CHL CUP STRESS STAGE 4 DBP: 94 mmHg
CHL CUP STRESS STAGE 4 SPEED: 2.5 mph
CHL CUP STRESS STAGE 5 SPEED: 0 mph
CHL CUP STRESS STAGE 6 DBP: 93 mmHg
CHL CUP STRESS STAGE 6 GRADE: 0 %
CHL CUP STRESS STAGE 6 HR: 85 {beats}/min
CHL CUP STRESS STAGE 6 SBP: 165 mmHg
CSEPPBP: 185 mmHg
CSEPPMHR: 95 %
Estimated workload: 6.3 METS
Exercise duration (min): 4 min
Exercise duration (sec): 26 s
LV dias vol: 73 mL (ref 62–150)
LV sys vol: 23 mL
MPHR: 153 {beats}/min
Peak HR: 146 {beats}/min
Percent HR: 96 %
SDS: 0
SRS: 6
Stage 2 Grade: 0 %
Stage 2 Speed: 0 mph
Stage 3 DBP: 95 mmHg
Stage 3 Grade: 10 %
Stage 3 HR: 125 {beats}/min
Stage 3 Speed: 1.7 mph
Stage 4 Grade: 12 %
Stage 4 HR: 146 {beats}/min
Stage 4 SBP: 185 mmHg
Stage 5 Grade: 0 %
Stage 5 HR: 106 {beats}/min
Stage 6 Speed: 0 mph
TID: 0.92

## 2016-02-15 MED ORDER — TECHNETIUM TC 99M TETROFOSMIN IV KIT
32.3200 | PACK | Freq: Once | INTRAVENOUS | Status: AC | PRN
  Administered 2016-02-15: 32.32 via INTRAVENOUS

## 2016-02-15 MED ORDER — TECHNETIUM TC 99M TETROFOSMIN IV KIT
12.0000 | PACK | Freq: Once | INTRAVENOUS | Status: AC | PRN
  Administered 2016-02-15: 12 via INTRAVENOUS

## 2016-02-19 ENCOUNTER — Telehealth: Payer: Self-pay | Admitting: Cardiology

## 2016-02-19 NOTE — Telephone Encounter (Signed)
Pt states this morning around 2 his heart started "jumping and skipping" like it was doing before, states it just eased off about 15 minutes ago. Please call. States he has a "little bit" of CP, has some burning. BP was 150/90, HR lying in bed was 85.

## 2016-02-19 NOTE — Telephone Encounter (Signed)
Patient states that he woke up this morning with fast heart rate, some skipped beats, and some burning in his chest. He denied any shortness of breath with this episode. He reports that his blood pressure was 150/90 and then in a few minutes he rechecked it and it was 126/78 and heart rate was ranging from 85-90. He states that he did have some burping and asked him if had eaten anything before going to bed which he reported that he did have some peanut butter on crackers. Instructed him to call his PCP for possible reflux medication recommendations or he could try some over the counter options. Stress test and echocardiogram recently done were normal and previous work up was negative. Instructed him to go to Emergency Room if he should develop any chest pain that does not go away or if she should develop shortness of breath. Scheduled him to come in and see Dr. Yvone Neu on 02/29/16 to discuss his concerns with her. He agreed with plan of care and verbalized understanding of our conversation.

## 2016-02-29 ENCOUNTER — Encounter: Payer: Self-pay | Admitting: Cardiology

## 2016-02-29 ENCOUNTER — Ambulatory Visit (INDEPENDENT_AMBULATORY_CARE_PROVIDER_SITE_OTHER): Payer: PPO | Admitting: Cardiology

## 2016-02-29 VITALS — BP 120/90 | HR 54 | Ht 70.0 in | Wt 183.2 lb

## 2016-02-29 DIAGNOSIS — I10 Essential (primary) hypertension: Secondary | ICD-10-CM

## 2016-02-29 DIAGNOSIS — I351 Nonrheumatic aortic (valve) insufficiency: Secondary | ICD-10-CM

## 2016-02-29 DIAGNOSIS — I480 Paroxysmal atrial fibrillation: Secondary | ICD-10-CM | POA: Diagnosis not present

## 2016-02-29 MED ORDER — DILTIAZEM HCL ER COATED BEADS 120 MG PO CP24: 120.0000 mg | ORAL_CAPSULE | Freq: Every day | ORAL | 3 refills | Status: DC

## 2016-02-29 MED ORDER — OMEPRAZOLE 20 MG PO CPDR: 20.0000 mg | DELAYED_RELEASE_CAPSULE | Freq: Every day | ORAL | 0 refills | Status: DC

## 2016-02-29 NOTE — Patient Instructions (Signed)
Medication Instructions:  Your physician has recommended you make the following change in your medication:  1. Prilosec 20 mg Once daily One month supply sent in. Check with your primary care provider for any further refills for this medication.  Check with your insurance on these medications to see which one would be cheaper for you. Xarelto Eliquis Pradaxa  Follow-Up: Your physician recommends that you schedule a follow-up appointment in: 3 months with Dr. Yvone Neu.  It was a pleasure seeing you today here in the office. Please do not hesitate to give [place] a call back if you have any further questions. [place], BSN

## 2016-02-29 NOTE — Progress Notes (Signed)
Cardiology Office Note   Date:  02/29/2016   ID:  Neil Bush, DOB [birth year], MRN [id number]  Referring Doctor:  Golden Pop, MD   Cardiologist:   Wende Bushy, MD   Reason for consultation:  Chief Complaint  Patient presents with  . other     Early f/u post myoview. Pt call: c/o fast heart rate/skipped beats/burning in his chest. Pt states he has been doing better, but Sunday felt skipped beats again and had a slow heart rate. Reviewed meds with pt verbally.      History of Present Illness: Neil Bush is a 65 y.o. male who has history of hypertension, hyperlipidemia, paroxysmal atrial fibrillation, presented with atrial fibrillation with rapid ventricular response 01/26/2016, here now for follow-up for A. fib  Patient has been tolerating medications well. No bruising or bleeding. No falls.  Patient had one episode when he felt his heart racing this weekend. No recurrence since then. Chest discomfort improved with Prilosec. No shortness of breath.  ROS:  Please see the history of present illness. Aside from mentioned under HPI, all other systems are reviewed and negative.     Past Medical History:  Diagnosis Date  . Hyperlipidemia   . Hypertension     Past Surgical History:  Procedure Laterality Date  . CARDIAC CATHETERIZATION     [facility]   . CHOLECYSTECTOMY    . EYE SURGERY    . FOOT SURGERY    . HERNIA REPAIR     X 2 ingunial  . KNEE SURGERY Right      reports that he has never smoked. He has never used smokeless tobacco. He reports that he does not drink alcohol or use drugs.   family history includes Cancer in his mother; Diabetes in his father; Heart disease in his brother and father; Hypertension in his father.   Outpatient Medications Prior to Visit  Medication Sig Dispense Refill  . atorvastatin (LIPITOR) 40 MG tablet Take 1 tablet (40 mg total) by mouth daily at 6 PM. 90 tablet 4  . lisinopril (PRINIVIL,ZESTRIL) 10 MG tablet Take 1 tablet (10  mg total) by mouth daily. 90 tablet 3  . rivaroxaban (XARELTO) 20 MG TABS tablet Take 1 tablet (20 mg total) by mouth daily. 30 tablet 2  . diltiazem (CARDIZEM CD) 120 MG 24 hr capsule Take 1 capsule (120 mg total) by mouth daily. 30 capsule 0  . POTASSIUM CHLORIDE PO Take 1 tablet by mouth daily.     No facility-administered medications prior to visit.      Allergies: Penicillins    PHYSICAL EXAM: VS:  BP 120/90 (BP Location: Left Arm, Patient Position: Sitting, Cuff Size: Normal)   Pulse (!) 54   Ht 5\' 10"  (1.778 m)   Wt 183 lb 4 oz (83.1 kg)   BMI 26.29 kg/m  , Body mass index is 26.29 kg/m. Wt Readings from Last 3 Encounters:  02/29/16 183 lb 4 oz (83.1 kg)  02/06/16 187 lb 12 oz (85.2 kg)  01/26/16 180 lb 3.2 oz (81.7 kg)    GENERAL:  well developed, well nourished, not in acute distress HEENT: normocephalic, pink conjunctivae, anicteric sclerae, no xanthelasma, normal dentition, oropharynx clear NECK:  no neck vein engorgement, JVP normal, no hepatojugular reflux, carotid upstroke brisk and symmetric, no bruit, no thyromegaly, no lymphadenopathy LUNGS:  good respiratory effort, clear to auscultation bilaterally CV:  PMI not displaced, no thrills, no lifts, S1 and S2 within normal limits, no palpable S3  or S4, no murmurs, no rubs, no gallops ABD:  Soft, nontender, nondistended, normoactive bowel sounds, no abdominal aortic bruit, no hepatomegaly, no splenomegaly MS: nontender back, no kyphosis, no scoliosis, no joint deformities EXT:  2+ DP/PT pulses, no edema, no varicosities, no cyanosis, no clubbing SKIN: warm, nondiaphoretic, normal turgor, no ulcers NEUROPSYCH: alert, oriented to person, place, and time, sensory/motor grossly intact, normal mood, appropriate affect  Recent Labs: 01/26/2016: ALT 25; BUN 27; Creatinine, Ser 1.02; Hemoglobin 18.1; Platelets 255; Potassium 4.3; Sodium 138; TSH 1.331   Lipid Panel    Component Value Date/Time   CHOL 162 01/21/2016 0905     CHOL 181 06/26/2015 0817   TRIG 149 01/21/2016 0905   TRIG 200 (H) 06/26/2015 0817   HDL 44 01/21/2016 0905   CHOLHDL 3.7 01/21/2016 0905   VLDL 40 (H) 06/26/2015 0817   LDLCALC 88 01/21/2016 0905     Other studies Reviewed:  EKG:  01/26/2016, 2:02 AM: Atrial fibrillation with rapid ventricular response ventricular rate of 135 BPM, nonspecific ST-T wave changes -personally reviewed  EKG from 02/06/2016 was personally reviewed by me showed sinus rhythm, 61 BPM. Nonspecific ST-T wave changes.  Additional studies/ records that were reviewed personally reviewed by me today include: Echo 01/27/2016: Left ventricle: The cavity size was normal. Wall thickness was normal. Systolic function was normal. The estimated ejection fraction was in the range of 55% to 60%. Left ventricular diastolic function parameters were normal for the patient&'s age. - Aortic valve: There was trivial regurgitation.  Nuclear stress is 02/15/2016:  Blood pressure demonstrated a normal response to exercise.  There was no ST segment deviation noted during stress.  The study is normal.  This is a low risk study.  The left ventricular ejection fraction is normal (55-65%).  ASSESSMENT AND PLAN: Atrial fibrillation, paroxysmal Sinus rhythm today. CHADS2-VASc=2. Agree with oral anticoagulation. Continue  Xarelto.Patient to check with her insurance regarding coverage for the end NOACs. Continue Cardizem CD 120 mg by mouth daily. If patient has more episodes of palpitations, consider event monitor or Holter. Echo reviewed with patient, LVEF within normal limits. Recommend outpatient sleep study. Patient has snoring and possible apneic episodes. Patient to check in with PCP about this.  Chest pain in the hospital ACS ruled out Likely from fast heart rates. Patient mentions other episodes where he would have sudden fatigue, diaphoresis. His cardiac workup has been over 10 years ago.  Nuclear stress  test 02/15/2016 was negative for ischemia. We'll give one month prescription for Prilosec 20 mg by mouth daily. Patient to follow-up with PCP for this.  Hypertension Blood pressure better on lisinopril 10 mg by mouth daily. Continue blood pressure monitoring.  Aortic regurgitation Trivial AI noted on echo. Recommend outpatient serial echo.   Current medicines are reviewed at length with the patient today.  The patient does not have concerns regarding medicines.  Labs/ tests ordered today include:  Orders Placed This Encounter  Procedures  . EKG 12-Lead    I had a lengthy and detailed discussion with the patient regarding diagnoses, prognosis, diagnostic options, treatment options , and side effects of medications.   I counseled the patient on importance of lifestyle modification including heart healthy diet, regular physical activity.   Disposition:   FU with undersigned after tests 82months   Signed, Wende Bushy, MD  02/29/2016 1:07 PM    [place]  This note was generated in part with voice recognition software and I apologize for any typographical  errors that were not detected and corrected.

## 2016-03-05 ENCOUNTER — Other Ambulatory Visit: Payer: Self-pay

## 2016-03-05 MED ORDER — DILTIAZEM HCL ER COATED BEADS 120 MG PO CP24: 120.0000 mg | ORAL_CAPSULE | Freq: Every day | ORAL | 1 refills | Status: DC

## 2016-03-21 ENCOUNTER — Telehealth: Payer: Self-pay | Admitting: Family Medicine

## 2016-03-21 NOTE — Telephone Encounter (Signed)
Neil Bush states you have been working with him on a bill he has been receiving regarding a physical he had on 12/26/2014.  He thought this was resolved but he has received another bill from [facility] regarding this charge.  He would like to speak with you regarding this.  Thanks  Santiago Glad  [phone]

## 2016-03-27 NOTE — Telephone Encounter (Signed)
Spoke with patient and advised that I have forwarded a request to the [facility] for additional review.  Patient has spoken with [facility] and he was advised that the claim for DOS 12/26/14 is "under review".

## 2016-04-07 ENCOUNTER — Telehealth: Payer: Self-pay | Admitting: Cardiology

## 2016-04-07 ENCOUNTER — Other Ambulatory Visit: Payer: Self-pay | Admitting: Family Medicine

## 2016-04-07 MED ORDER — OMEPRAZOLE 20 MG PO CPDR: 20.0000 mg | DELAYED_RELEASE_CAPSULE | Freq: Every day | ORAL | 3 refills | Status: DC

## 2016-04-07 NOTE — Telephone Encounter (Signed)
Patient needs refill for Prilosec.  Per note on med list patient needs to contact pcp office for additional refills.  Patient aware.  Will call Dr. Durenda Age office .

## 2016-04-07 NOTE — Telephone Encounter (Signed)
  Last routine OV: 01/21/16 Next OV: 07/17/16

## 2016-04-07 NOTE — Telephone Encounter (Signed)
Pt would like refill for omeprazole (PRILOSEC) 20 MG capsule sent to [facility].

## 2016-05-21 NOTE — Progress Notes (Signed)
Cardiology Office Note   Date:  05/22/2016   ID:  Neil Bush, DOB 08-23-1950, MRN [id number]  Referring Doctor:  Golden Pop, MD   Cardiologist:   Neil Bushy, MD   Reason for consultation:  Chief Complaint  Patient presents with  . OTHER    Afib. Meds reviewed verbally with pt.      History of Present Illness: Neil Bush is a 66 y.o. male who has history of hypertension, hyperlipidemia, paroxysmal atrial fibrillation, presented with atrial fibrillation with rapid ventricular response 01/26/2016, here now for follow-up for A. fib  Patient overall has been doing well. Since his last visit, he reports having probably a couple of episodes of feeling his heart racing. The episodes are short in duration, lasting at the most a couple of minutes at a time. He does not know exactly what precipitated the episodes. He was not bothered by them since they spontaneously resolved. No chest pain, shortness of breath.  He reports compliance with his medications. He thinks that the omeprazole helped a lot with his reflux. No bleeding from the Xarelto. He needs refill.  ROS:  Please see the history of present illness. Aside from mentioned under HPI, all other systems are reviewed and negative.    Past Medical History:  Diagnosis Date  . Hyperlipidemia   . Hypertension     Past Surgical History:  Procedure Laterality Date  . CARDIAC CATHETERIZATION     [facility]   . CHOLECYSTECTOMY    . EYE SURGERY    . FOOT SURGERY    . HERNIA REPAIR     X 2 ingunial  . KNEE SURGERY Right      reports that he has never smoked. He has never used smokeless tobacco. He reports that he does not drink alcohol or use drugs.   family history includes Cancer in his mother; Diabetes in his father; Heart disease in his brother and father; Hypertension in his father.   Outpatient Medications Prior to Visit  Medication Sig Dispense Refill  . atorvastatin (LIPITOR) 40 MG tablet Take 1 tablet (40 mg  total) by mouth daily at 6 PM. 90 tablet 4  . diltiazem (CARDIZEM CD) 120 MG 24 hr capsule Take 1 capsule (120 mg total) by mouth daily. 90 capsule 1  . lisinopril (PRINIVIL,ZESTRIL) 10 MG tablet Take 1 tablet (10 mg total) by mouth daily. 90 tablet 3  . omeprazole (PRILOSEC) 20 MG capsule Take 1 capsule (20 mg total) by mouth daily. 30 capsule 3  . rivaroxaban (XARELTO) 20 MG TABS tablet Take 1 tablet (20 mg total) by mouth daily. 30 tablet 2   No facility-administered medications prior to visit.      Allergies: Penicillins    PHYSICAL EXAM: VS:  BP 136/80 (BP Location: Left Arm, Patient Position: Sitting, Cuff Size: Normal)   Pulse 68   Ht '5\' 10"'  (1.778 m)   Wt 187 lb 12 oz (85.2 kg)   BMI 26.94 kg/m  , Body mass index is 26.94 kg/m. Wt Readings from Last 3 Encounters:  05/22/16 187 lb 12 oz (85.2 kg)  02/29/16 183 lb 4 oz (83.1 kg)  02/06/16 187 lb 12 oz (85.2 kg)    GENERAL:  well developed, well nourished, not in acute distress HEENT: normocephalic, pink conjunctivae, anicteric sclerae, no xanthelasma, normal dentition, oropharynx clear NECK:  no neck vein engorgement, JVP normal, no hepatojugular reflux, carotid upstroke brisk and symmetric, no bruit, no thyromegaly, no lymphadenopathy LUNGS:  good  respiratory effort, clear to auscultation bilaterally CV:  PMI not displaced, no thrills, no lifts, S1 and S2 within normal limits, no palpable S3 or S4, no murmurs, no rubs, no gallops ABD:  Soft, nontender, nondistended, normoactive bowel sounds, no abdominal aortic bruit, no hepatomegaly, no splenomegaly MS: nontender back, no kyphosis, no scoliosis, no joint deformities EXT:  2+ DP/PT pulses, no edema, no varicosities, no cyanosis, no clubbing SKIN: warm, nondiaphoretic, normal turgor, no ulcers NEUROPSYCH: alert, oriented to person, place, and time, sensory/motor grossly intact, normal mood, appropriate affect    Recent Labs: 01/26/2016: ALT 25; BUN 27; Creatinine, Ser  1.02; Hemoglobin 18.1; Platelets 255; Potassium 4.3; Sodium 138; TSH 1.331   Lipid Panel    Component Value Date/Time   CHOL 162 01/21/2016 0905   CHOL 181 06/26/2015 0817   TRIG 149 01/21/2016 0905   TRIG 200 (H) 06/26/2015 0817   HDL 44 01/21/2016 0905   CHOLHDL 3.7 01/21/2016 0905   VLDL 40 (H) 06/26/2015 0817   LDLCALC 88 01/21/2016 0905     Other studies Reviewed:  EKG:  01/26/2016, 2:02 AM: Atrial fibrillation with rapid ventricular response ventricular rate of 135 BPM, nonspecific ST-T wave changes -personally reviewed  EKG from 02/06/2016 was personally reviewed by me showed sinus rhythm, 61 BPM. Nonspecific ST-T wave changes.  EKG from 05/22/2016 was personally reviewed by me and it revealed sinus rhythm, 60 BPM.  Additional studies/ records that were reviewed personally reviewed by me today include: Echo 01/27/2016: Left ventricle: The cavity size was normal. Wall thickness was normal. Systolic function was normal. The estimated ejection fraction was in the range of 55% to 60%. Left ventricular diastolic function parameters were normal for the patient&'s age. - Aortic valve: There was trivial regurgitation.  Nuclear stress is 02/15/2016:  Blood pressure demonstrated a normal response to exercise.  There was no ST segment deviation noted during stress.  The study is normal.  This is a low risk study.  The left ventricular ejection fraction is normal (55-65%).  ASSESSMENT AND PLAN: Atrial fibrillation, paroxysmal Sinus rhythm today. Recommendations and plans similar to last visit 02/29/2016: CHADS2-VASc=2. Agree with oral anticoagulation. Continue Xarelto. Continue Cardizem CD 120 mg by mouth daily. Recommend outpatient sleep study. Patient has snoring and possible apneic episodes. Patient to check in with PCP about this. Check CMP today.  Hypertension BP is well controlled. Continue monitoring BP. Continue current medical therapy and lifestyle  changes.  Aortic regurgitation Trivial AI noted on echo. No appreciated murmur. Serial evaluation recommended.   Current medicines are reviewed at length with the patient today.  The patient does not have concerns regarding medicines.  Labs/ tests ordered today include:  Orders Placed This Encounter  Procedures  . Comp Met (CMET)  . EKG 12-Lead    I had a lengthy and detailed discussion with the patient regarding diagnoses, prognosis, diagnostic options, treatment options , and side effects of medications.   I counseled the patient on importance of lifestyle modification including heart healthy diet, regular physical activity.   Disposition:   FU with Cardiology In 6 months time.  Signed, Neil Bushy, MD  05/22/2016 9:06 AM    [place]  This note was generated in part with voice recognition software and I apologize for any typographical errors that were not detected and corrected.

## 2016-05-22 ENCOUNTER — Ambulatory Visit (INDEPENDENT_AMBULATORY_CARE_PROVIDER_SITE_OTHER): Payer: PPO | Admitting: Cardiology

## 2016-05-22 ENCOUNTER — Encounter: Payer: Self-pay | Admitting: Cardiology

## 2016-05-22 VITALS — BP 136/80 | HR 68 | Ht 70.0 in | Wt 187.8 lb

## 2016-05-22 DIAGNOSIS — I480 Paroxysmal atrial fibrillation: Secondary | ICD-10-CM

## 2016-05-22 DIAGNOSIS — I351 Nonrheumatic aortic (valve) insufficiency: Secondary | ICD-10-CM

## 2016-05-22 DIAGNOSIS — I10 Essential (primary) hypertension: Secondary | ICD-10-CM | POA: Diagnosis not present

## 2016-05-22 MED ORDER — RIVAROXABAN 20 MG PO TABS: 20.0000 mg | ORAL_TABLET | Freq: Every day | ORAL | 6 refills | Status: DC

## 2016-05-22 NOTE — Patient Instructions (Signed)
Medication Instructions:  Refills sent in for Xarelto 20 mg Once daily  Medication Samples have been provided to the patient.  Drug name: Xarelto       Strength: 20 mg        Qty: 2 bottles  LOT: [id number]  Exp.Date: 04/20  Dosing instructions: Take one tablet daily   Labwork: We will call you with your lab results.    Follow-Up: Your physician wants you to follow-up in: 6 months. You will receive a reminder letter in the mail two months in advance. If you don't receive a letter, please call our office to schedule the follow-up appointment.  It was a pleasure seeing you today here in the office. Please do not hesitate to give [place] a call back if you have any further questions. [place], BSN

## 2016-05-23 LAB — COMPREHENSIVE METABOLIC PANEL
A/G RATIO: 2 (ref 1.2–2.2)
ALBUMIN: 4.3 g/dL (ref 3.6–4.8)
ALK PHOS: 93 IU/L (ref 39–117)
ALT: 20 IU/L (ref 0–44)
AST: 16 IU/L (ref 0–40)
BILIRUBIN TOTAL: 0.7 mg/dL (ref 0.0–1.2)
BUN / CREAT RATIO: 14 (ref 10–24)
BUN: 15 mg/dL (ref 8–27)
CHLORIDE: 103 mmol/L (ref 96–106)
CO2: 22 mmol/L (ref 18–29)
Calcium: 8.9 mg/dL (ref 8.6–10.2)
Creatinine, Ser: 1.06 mg/dL (ref 0.76–1.27)
GFR calc Af Amer: 84 mL/min/{1.73_m2} (ref >59)
GFR calc non Af Amer: 72 mL/min/{1.73_m2} (ref >59)
GLOBULIN, TOTAL: 2.2 g/dL (ref 1.5–4.5)
GLUCOSE: 118 mg/dL — AB (ref 65–99)
POTASSIUM: 4.1 mmol/L (ref 3.5–5.2)
SODIUM: 143 mmol/L (ref 134–144)
Total Protein: 6.5 g/dL (ref 6.0–8.5)

## 2016-07-02 ENCOUNTER — Ambulatory Visit: Payer: PPO | Admitting: Podiatry

## 2016-07-17 ENCOUNTER — Ambulatory Visit: Payer: PPO | Admitting: Family Medicine

## 2016-07-23 ENCOUNTER — Ambulatory Visit (INDEPENDENT_AMBULATORY_CARE_PROVIDER_SITE_OTHER): Payer: PPO | Admitting: Family Medicine

## 2016-07-23 ENCOUNTER — Encounter: Payer: Self-pay | Admitting: Family Medicine

## 2016-07-23 VITALS — BP 136/86 | HR 74 | Wt 191.8 lb

## 2016-07-23 DIAGNOSIS — I10 Essential (primary) hypertension: Secondary | ICD-10-CM

## 2016-07-23 DIAGNOSIS — I480 Paroxysmal atrial fibrillation: Secondary | ICD-10-CM | POA: Diagnosis not present

## 2016-07-23 DIAGNOSIS — E782 Mixed hyperlipidemia: Secondary | ICD-10-CM | POA: Diagnosis not present

## 2016-07-23 LAB — LP+ALT+AST PICCOLO, WAIVED
ALT (SGPT) PICCOLO, WAIVED: 21 U/L (ref 10–47)
AST (SGOT) PICCOLO, WAIVED: 23 U/L (ref 11–38)
CHOLESTEROL PICCOLO, WAIVED: 171 mg/dL (ref <200)
Chol/HDL Ratio Piccolo,Waive: 3.9 mg/dL
HDL CHOL PICCOLO, WAIVED: 44 mg/dL — AB (ref >59)
LDL Chol Calc Piccolo Waived: 89 mg/dL (ref <100)
TRIGLYCERIDES PICCOLO,WAIVED: 188 mg/dL — AB (ref <150)
VLDL Chol Calc Piccolo,Waive: 38 mg/dL — ABNORMAL HIGH (ref <30)

## 2016-07-23 MED ORDER — OMEPRAZOLE 20 MG PO CPDR
20.0000 mg | DELAYED_RELEASE_CAPSULE | Freq: Every day | ORAL | 3 refills | Status: DC
Start: 2016-07-23 — End: 2017-01-01

## 2016-07-23 NOTE — Assessment & Plan Note (Signed)
The current medical regimen is effective;  continue present plan and medications.  

## 2016-07-23 NOTE — Progress Notes (Signed)
BP 136/86   Pulse 74   Wt 191 lb 12.8 oz (87 kg)   SpO2 98%   BMI 27.52 kg/m    Subjective:    Patient ID: Neil Bush, male    DOB: November 22, 1950, 66 y.o.   MRN: [id number]  HPI: Neil Bush is a 66 y.o. male  Chief Complaint  Patient presents with  . Follow-up   Patient follow-up hypercholesterol. Doing well no complaints from medications Atrial fibrillation hypertension doing well with medications no issues or concerns has been through episode with ER and rapid heartbeat with negative workup. Is doing well now.  Relevant past medical, surgical, family and social history reviewed and updated as indicated. Interim medical history since our last visit reviewed. Allergies and medications reviewed and updated.  Review of Systems  Constitutional: Negative.   Respiratory: Negative.   Cardiovascular: Negative.     Per HPI unless specifically indicated above     Objective:    BP 136/86   Pulse 74   Wt 191 lb 12.8 oz (87 kg)   SpO2 98%   BMI 27.52 kg/m   Wt Readings from Last 3 Encounters:  07/23/16 191 lb 12.8 oz (87 kg)  05/22/16 187 lb 12 oz (85.2 kg)  02/29/16 183 lb 4 oz (83.1 kg)    Physical Exam  Constitutional: He is oriented to person, place, and time. He appears well-developed and well-nourished.  HENT:  Head: Normocephalic and atraumatic.  Eyes: Conjunctivae and EOM are normal.  Neck: Normal range of motion.  Cardiovascular: Normal rate, regular rhythm and normal heart sounds.   Pulmonary/Chest: Effort normal and breath sounds normal.  Musculoskeletal: Normal range of motion.  Neurological: He is alert and oriented to person, place, and time.  Skin: No erythema.  Psychiatric: He has a normal mood and affect. His behavior is normal. Judgment and thought content normal.    Results for orders placed or performed in visit on 05/22/16  Comp Met (CMET)  Result Value Ref Range   Glucose 118 (H) 65 - 99 mg/dL   BUN 15 8 - 27 mg/dL   Creatinine, Ser  1.06 0.76 - 1.27 mg/dL   GFR calc non Af Amer 72 >59 mL/min/1.73   GFR calc Af Amer 84 >59 mL/min/1.73   BUN/Creatinine Ratio 14 10 - 24   Sodium 143 134 - 144 mmol/L   Potassium 4.1 3.5 - 5.2 mmol/L   Chloride 103 96 - 106 mmol/L   CO2 22 18 - 29 mmol/L   Calcium 8.9 8.6 - 10.2 mg/dL   Total Protein 6.5 6.0 - 8.5 g/dL   Albumin 4.3 3.6 - 4.8 g/dL   Globulin, Total 2.2 1.5 - 4.5 g/dL   Albumin/Globulin Ratio 2.0 1.2 - 2.2   Bilirubin Total 0.7 0.0 - 1.2 mg/dL   Alkaline Phosphatase 93 39 - 117 IU/L   AST 16 0 - 40 IU/L   ALT 20 0 - 44 IU/L      Assessment & Plan:   Problem List Items Addressed This Visit      Cardiovascular and Mediastinum   Essential hypertension - Primary    The current medical regimen is effective;  continue present plan and medications.       Relevant Orders   Basic metabolic panel   LP+ALT+AST Piccolo, Waived   Paroxysmal atrial fibrillation [facility])    The current medical regimen is effective;  continue present plan and medications.         Other  Hyperlipidemia    The current medical regimen is effective;  continue present plan and medications.       Relevant Orders   Basic metabolic panel   LP+ALT+AST Piccolo, Waived       Follow up plan: Return in about 6 months (around 01/23/2017) for Physical Exam.

## 2016-07-24 ENCOUNTER — Encounter: Payer: Self-pay | Admitting: Family Medicine

## 2016-07-24 LAB — BASIC METABOLIC PANEL
BUN/Creatinine Ratio: 18 (ref 10–24)
BUN: 20 mg/dL (ref 8–27)
CO2: 23 mmol/L (ref 18–29)
CREATININE: 1.11 mg/dL (ref 0.76–1.27)
Calcium: 8.8 mg/dL (ref 8.6–10.2)
Chloride: 105 mmol/L (ref 96–106)
GFR, EST AFRICAN AMERICAN: 79 mL/min/{1.73_m2} (ref >59)
GFR, EST NON AFRICAN AMERICAN: 68 mL/min/{1.73_m2} (ref >59)
Glucose: 108 mg/dL — ABNORMAL HIGH (ref 65–99)
Potassium: 4.2 mmol/L (ref 3.5–5.2)
SODIUM: 139 mmol/L (ref 134–144)

## 2016-08-27 ENCOUNTER — Telehealth: Payer: Self-pay | Admitting: Family Medicine

## 2016-08-27 NOTE — Telephone Encounter (Signed)
Neil Bush stopped by the office to see if you had heard anything regarding the issue with his bill.  Thanks

## 2016-09-01 ENCOUNTER — Ambulatory Visit (INDEPENDENT_AMBULATORY_CARE_PROVIDER_SITE_OTHER): Payer: PPO | Admitting: Family Medicine

## 2016-09-01 DIAGNOSIS — I10 Essential (primary) hypertension: Secondary | ICD-10-CM | POA: Diagnosis not present

## 2016-09-01 DIAGNOSIS — I480 Paroxysmal atrial fibrillation: Secondary | ICD-10-CM | POA: Diagnosis not present

## 2016-09-01 MED ORDER — DILTIAZEM HCL ER COATED BEADS 240 MG PO CP24: 240.0000 mg | ORAL_CAPSULE | Freq: Every day | ORAL | 3 refills | Status: DC

## 2016-09-01 NOTE — Assessment & Plan Note (Signed)
Discuss atrial fibrillation intermittently we will increase diltiazem from 122 to 240 mg in an attempt to reduce episodes of atrial fibrillation. This may also help with elevated blood pressures patient's been taking lisinopril 10 mg twice a day.

## 2016-09-01 NOTE — Assessment & Plan Note (Signed)
Discuss hypertension been well controlled on lisinopril 10 mg twice a day and diltiazem 120. Changing to lisinopril once a day and diltiazem 240 Will observe blood pressure.

## 2016-09-01 NOTE — Progress Notes (Signed)
There were no vitals taken for this visit.   Subjective:    Patient ID: Neil Bush, male    DOB: 27-Jul-1950, 66 y.o.   MRN: [id number]  HPI: Neil Bush is a 66 y.o. male  Chief Complaint  Patient presents with  . Hypertension   Discuss hypertension patient also have intermittent episodes of atrial fibrillation. Has controlled blood pressure with taking lisinopril 10 mg twice a day. Is interested in once a day medication. Because of patient's intermittent atrial fibrillation also wondering about increasing diltiazem to 240 mg to possibly reduce episodes of atrial fibrillation.  Taking Xarelto with no bleeding bruising issues. Also doing well with cholesterol medicines. Relevant past medical, surgical, family and social history reviewed and updated as indicated. Interim medical history since our last visit reviewed. Allergies and medications reviewed and updated.  Review of Systems  Constitutional: Negative.   Respiratory: Negative.   Cardiovascular: Negative.     Per HPI unless specifically indicated above     Objective:    There were no vitals taken for this visit.  Wt Readings from Last 3 Encounters:  07/23/16 191 lb 12.8 oz (87 kg)  05/22/16 187 lb 12 oz (85.2 kg)  02/29/16 183 lb 4 oz (83.1 kg)    Physical Exam  Constitutional: He is oriented to person, place, and time. He appears well-developed and well-nourished.  HENT:  Head: Normocephalic and atraumatic.  Eyes: Conjunctivae and EOM are normal.  Neck: Normal range of motion.  Cardiovascular: Normal rate, regular rhythm and normal heart sounds.   Pulmonary/Chest: Effort normal and breath sounds normal.  Musculoskeletal: Normal range of motion.  Neurological: He is alert and oriented to person, place, and time.  Skin: No erythema.  Psychiatric: He has a normal mood and affect. His behavior is normal. Judgment and thought content normal.    Results for orders placed or performed in visit on 14/97/02    Basic metabolic panel  Result Value Ref Range   Glucose 108 (H) 65 - 99 mg/dL   BUN 20 8 - 27 mg/dL   Creatinine, Ser 1.11 0.76 - 1.27 mg/dL   GFR calc non Af Amer 68 >59 mL/min/1.73   GFR calc Af Amer 79 >59 mL/min/1.73   BUN/Creatinine Ratio 18 10 - 24   Sodium 139 134 - 144 mmol/L   Potassium 4.2 3.5 - 5.2 mmol/L   Chloride 105 96 - 106 mmol/L   CO2 23 18 - 29 mmol/L   Calcium 8.8 8.6 - 10.2 mg/dL  LP+ALT+AST Piccolo, Waived  Result Value Ref Range   ALT (SGPT) Piccolo, Waived 21 10 - 47 U/L   AST (SGOT) Piccolo, Waived 23 11 - 38 U/L   Cholesterol Piccolo, Waived 171 <200 mg/dL   HDL Chol Piccolo, Waived 44 (L) >59 mg/dL   Triglycerides Piccolo,Waived 188 (H) <150 mg/dL   Chol/HDL Ratio Piccolo,Waive 3.9 mg/dL   LDL Chol Calc Piccolo Waived 89 <100 mg/dL   VLDL Chol Calc Piccolo,Waive 38 (H) <30 mg/dL      Assessment & Plan:   Problem List Items Addressed This Visit      Cardiovascular and Mediastinum   Essential hypertension    Discuss hypertension been well controlled on lisinopril 10 mg twice a day and diltiazem 120. Changing to lisinopril once a day and diltiazem 240 Will observe blood pressure.      Relevant Medications   diltiazem (CARDIZEM CD) 240 MG 24 hr capsule   Paroxysmal atrial fibrillation (HCC)  Discuss atrial fibrillation intermittently we will increase diltiazem from 122 to 240 mg in an attempt to reduce episodes of atrial fibrillation. This may also help with elevated blood pressures patient's been taking lisinopril 10 mg twice a day.      Relevant Medications   diltiazem (CARDIZEM CD) 240 MG 24 hr capsule       Follow up plan: Return for As scheduled.

## 2016-09-17 ENCOUNTER — Ambulatory Visit (INDEPENDENT_AMBULATORY_CARE_PROVIDER_SITE_OTHER): Payer: PPO

## 2016-09-17 ENCOUNTER — Encounter: Payer: Self-pay | Admitting: Nurse Practitioner

## 2016-09-17 ENCOUNTER — Ambulatory Visit (INDEPENDENT_AMBULATORY_CARE_PROVIDER_SITE_OTHER): Payer: PPO | Admitting: Nurse Practitioner

## 2016-09-17 VITALS — BP 130/74 | HR 62 | Ht 70.0 in | Wt 184.0 lb

## 2016-09-17 DIAGNOSIS — I480 Paroxysmal atrial fibrillation: Secondary | ICD-10-CM | POA: Diagnosis not present

## 2016-09-17 DIAGNOSIS — I10 Essential (primary) hypertension: Secondary | ICD-10-CM

## 2016-09-17 DIAGNOSIS — E782 Mixed hyperlipidemia: Secondary | ICD-10-CM | POA: Diagnosis not present

## 2016-09-17 NOTE — Patient Instructions (Signed)
Medication Instructions:  Your physician recommends that you continue on your current medications as directed. Please refer to the Current Medication list given to you today.   Labwork: BMET, Magnesium, TSH  Testing/Procedures: Your physician has recommended that you wear a holter monitor. Holter monitors are medical devices that record the heart's electrical activity. Doctors most often use these monitors to diagnose arrhythmias. Arrhythmias are problems with the speed or rhythm of the heartbeat. The monitor is a small, portable device. You can wear one while you do your normal daily activities. This is usually used to diagnose what is causing palpitations/syncope (passing out).    Follow-Up: Your physician recommends that you schedule a follow-up appointment in: 2 weeks with Ignacia Bayley, NP.    Any Other Special Instructions Will Be Listed Below (If Applicable).     If you need a refill on your cardiac medications before your next appointment, please call your pharmacy.

## 2016-09-17 NOTE — Progress Notes (Signed)
Office Visit    Patient Name: Neil Bush Date of Encounter: 09/17/2016  Primary Care Provider:  Guadalupe Maple, MD Primary Cardiologist:  Formerly A. Yvone Neu, MD   Chief Complaint    66 year old male with a prior history of paroxysmal atrial fibrillation, hypertension, hyperlipidemia, and chest pain who presents for follow-up related to palpitations.  Past Medical History    Past Medical History:  Diagnosis Date  . Chest pain    a. 01/2016 in setting of Afib;  b. 02/2016 Ex MV: EF 55-65%, no ischemia.  . Hyperlipidemia   . Hypertension   . PAF (paroxysmal atrial fibrillation) ([place])    a. 01/2016 - admission for rapid AF and c/p-->converted on IV dilt;  b. CHA2DS2VASc = 2-->Xarelto;  c. 01/2016 Echo: EF 55-60%, triv AI.   Past Surgical History:  Procedure Laterality Date  . CARDIAC CATHETERIZATION     [facility]   . CHOLECYSTECTOMY    . EYE SURGERY    . FOOT SURGERY    . HERNIA REPAIR     X 2 ingunial  . KNEE SURGERY Right     Allergies  Allergies  Allergen Reactions  . Penicillins Rash    Has patient had a PCN reaction causing immediate rash, facial/tongue/throat swelling, SOB or lightheadedness with hypotension: no Has patient had a PCN reaction causing severe rash involving mucus membranes or skin necrosis: no Has patient had a PCN reaction that required hospitalization no Has patient had a PCN reaction occurring within the last 10 years: no If all of the above answers are "NO", then may proceed with Cephalosporin use.    Childhood allergy    History of Present Illness    66 year old male with the above past medical history including hypertension, hyperlipidemia, and paroxysmal atrial fibrillation, which was diagnosed in November 2017. He was admitted at that time and also had chest pain. He converted to sinus rhythm on IV diltiazem and was placed on Xarelto therapy in the setting of a CHA2DS2VASc of 2. Stress testing was undertaken and showed no evidence of  ischemia with normal LV function. Echo also showed normal LV function with trivial aortic insufficiency. Over the past few weeks, he has noted increased frequency of palpitations. He describes this as a regular heartbeat intermittently interrupted by a skipped beat. We discussed his symptoms at length to be very clear that he is not experiencing prolonged episodes of irregularity but however more frequent episodes of skipped beats. Symptoms may last anywhere between a few minutes to an hour at a time and have occurred at least once or twice a week for the past 3 weeks or so. His last episode occurred this morning, lasting about 2 minutes, and resolving spontaneously. When this occurs, he is bothered by palpitations but has not been having any chest pain, dyspnea, PND, orthopnea, dizziness, syncope, edema, or early satiety.  Home Medications    Prior to Admission medications   Medication Sig Start Date End Date Taking? Authorizing Provider  atorvastatin (LIPITOR) 40 MG tablet Take 1 tablet (40 mg total) by mouth daily at 6 PM. 01/21/16  Yes Crissman, Jeannette How, MD  diltiazem (CARDIZEM CD) 240 MG 24 hr capsule Take 1 capsule (240 mg total) by mouth daily. 09/01/16  Yes Guadalupe Maple, MD  meloxicam (MOBIC) 15 MG tablet  06/24/16  Yes [provider]  omeprazole (PRILOSEC) 20 MG capsule Take 1 capsule (20 mg total) by mouth daily. 07/23/16  Yes Guadalupe Maple, MD  rivaroxaban (XARELTO) 20 MG TABS tablet Take 1 tablet (20 mg total) by mouth daily. 05/22/16  Yes Wende Bushy, MD  lisinopril (PRINIVIL,ZESTRIL) 10 MG tablet Take 1 tablet (10 mg total) by mouth daily. Patient taking differently: Take 10 mg by mouth 2 (two) times daily.  02/11/16 05/22/16  Minna Merritts, MD    Review of Systems    Palpitations and skipped beats as noted above. He denies chest pain, dyspnea, PND, orthopnea, dizziness, syncope, edema, or early satiety. All other systems reviewed and are otherwise negative except as  noted above.  Physical Exam    VS:  BP 130/74 (BP Location: Left Arm, Patient Position: Sitting, Cuff Size: Normal)   Pulse 62   Ht 5\' 10"  (1.778 m)   Wt 184 lb (83.5 kg)   BMI 26.40 kg/m  , BMI Body mass index is 26.4 kg/m. GEN: Well nourished, well developed, in no acute distress.  HEENT: normal.  Neck: Supple, no JVD, carotid bruits, or masses. Cardiac: RRR, no murmurs, rubs, or gallops. No clubbing, cyanosis, edema.  Radials/DP/PT 2+ and equal bilaterally.  Respiratory:  Respirations regular and unlabored, clear to auscultation bilaterally. GI: Soft, nontender, nondistended, BS + x 4. MS: no deformity or atrophy. Skin: warm and dry, no rash. Neuro:  Strength and sensation are intact. Psych: Normal affect.  Accessory Clinical Findings    ECG - Regular sinus rhythm, 62, no acute ST or T changes.  Assessment & Plan    1.  Palpitations/paroxysmal atrial fibrillation: He was diagnosed with atrial fibrillation in November 2017 when he presented to the emergency department and subsequently converted sinus rhythm. He has been on chronic xarelto therapy in the setting of a CHA2DS2VASc 2 and has not missed any doses. He is also on long-acting diltiazem therapy which was recently increased to 240 mg in the setting of a several week history of increasing palpitations. These may occur at any time and an episode can last up to one hour and then resolve spontaneously. In describing his symptoms, he says that he notes an occasional skipped beat but otherwise he feels as though his heart rhythm is regular. He denies experiencing any prolonged episodes of irregular heart rhythm. Given the lack of clarity as to whether or not he is truly experiencing more A. fib versus potentially PACs or PVCs, I will place a cardiac monitor to evaluate his rhythm. If he is having more atrial fibrillation, I will plan to initiate flecainide therapy at 50 mg every 12 hours to be followed by stress testing in about 2  weeks. If however he is symptomatic with PACs or PVCs, I would consider adding beta blocker therapy. Either way, I will follow-up a basic metabolic panel, magnesium, and TSH today. I will also plan to see him back next week to review his monitor and make a decision regarding medication changes.  2.  Essential HTN:  Stable.  3.  HL:  LDL 88 in Nov 2017.  Cont statin Rx.  4. Dispo:  F/u in 1 wk after monitoring.  BMET, Mg, TSH today.   Murray Hodgkins, NP 09/17/2016, 1:34 PM

## 2016-09-18 ENCOUNTER — Telehealth: Payer: Self-pay | Admitting: Nurse Practitioner

## 2016-09-18 LAB — MAGNESIUM: Magnesium: 2 mg/dL (ref 1.6–2.3)

## 2016-09-18 LAB — BASIC METABOLIC PANEL
BUN/Creatinine Ratio: 18 (ref 10–24)
BUN: 20 mg/dL (ref 8–27)
CO2: 20 mmol/L (ref 20–29)
CREATININE: 1.12 mg/dL (ref 0.76–1.27)
Calcium: 8.9 mg/dL (ref 8.6–10.2)
Chloride: 102 mmol/L (ref 96–106)
GFR, EST AFRICAN AMERICAN: 78 mL/min/{1.73_m2} (ref >59)
GFR, EST NON AFRICAN AMERICAN: 67 mL/min/{1.73_m2} (ref >59)
Glucose: 137 mg/dL — ABNORMAL HIGH (ref 65–99)
POTASSIUM: 4.1 mmol/L (ref 3.5–5.2)
SODIUM: 139 mmol/L (ref 134–144)

## 2016-09-18 LAB — TSH: TSH: 1.48 u[IU]/mL (ref 0.450–4.500)

## 2016-09-18 NOTE — Telephone Encounter (Signed)
Pt states he was told yesterday when his "heart started acting up" to "push this button". He asks does he need to "push it when it stops". Please call and advise.

## 2016-09-18 NOTE — Telephone Encounter (Signed)
Spoke with patient and he just wanted to know when to push the button on his heart monitor. Instructed him to push button when he has any symptoms and that would identify that period of time. He verbalized understanding and was appreciative for the call back.

## 2016-09-22 ENCOUNTER — Telehealth: Payer: Self-pay | Admitting: Nurse Practitioner

## 2016-09-22 MED ORDER — DILTIAZEM HCL 30 MG PO TABS: 30.0000 mg | ORAL_TABLET | Freq: Four times a day (QID) | ORAL | 6 refills | Status: DC | PRN

## 2016-09-22 NOTE — Telephone Encounter (Signed)
Is there any chance we'll have monitor results today?  If not, and if symptoms are persisting, is it possible for him to come to the office for ECG?  He probably needs flecainide 50mg   q12h, but we need to know that this is Afib first.  In the interim, we can send in a Rx for short acting diltiazem 30mg  q6h prn palpitations.

## 2016-09-22 NOTE — Telephone Encounter (Signed)
Pt wore a Zio monitor - he put it in his mailbox yesterday, so we won't have results until next week. Spoke w/ pt.  Advised him of Chris's recommendation.  He states that he just had an EKG last week and does not want another one.  Reviewed w/ him the results of that EKG showed NSR; cannot send in flecainide unless we have evidence that he is in afib. He states that he would like to try the short acting diltiazem prn until his appt w/ Gerald Stabs next week.  Asked him to call back if he changes his mind and would like an EKG.

## 2016-09-22 NOTE — Telephone Encounter (Signed)
Pt thinks he is in A-Fib Shortness of breath And tightness in chest  Lots of discomfort 7 or 8 times this morning

## 2016-09-22 NOTE — Telephone Encounter (Signed)
Spoke w/ pt.  He reports that his "afib is acting up". Reports that sx will last 15-20 mins, then resolve.  He has attempted to check his pulse, but cannot get an accurate count due to skipped beats.  He has an appt w/ Ignacia Bayley, NP on 8/1, but states "I don't think I can last that long". His diltiazem was recently increased from 120 mg daily to 240 mg daily, but he cannot see that it is helping.  He wore a holter monitor over the weekend and turned it in yesterday.  Advised him that I will make Gerald Stabs aware of his concerns and call him back w/ his recommendation.

## 2016-10-01 ENCOUNTER — Encounter: Payer: Self-pay | Admitting: Nurse Practitioner

## 2016-10-01 ENCOUNTER — Ambulatory Visit (INDEPENDENT_AMBULATORY_CARE_PROVIDER_SITE_OTHER): Payer: PPO | Admitting: Nurse Practitioner

## 2016-10-01 ENCOUNTER — Telehealth: Payer: Self-pay | Admitting: Nurse Practitioner

## 2016-10-01 VITALS — BP 116/80 | HR 67 | Ht 70.0 in | Wt 184.0 lb

## 2016-10-01 DIAGNOSIS — E782 Mixed hyperlipidemia: Secondary | ICD-10-CM | POA: Diagnosis not present

## 2016-10-01 DIAGNOSIS — I10 Essential (primary) hypertension: Secondary | ICD-10-CM

## 2016-10-01 DIAGNOSIS — I491 Atrial premature depolarization: Secondary | ICD-10-CM | POA: Diagnosis not present

## 2016-10-01 DIAGNOSIS — I480 Paroxysmal atrial fibrillation: Secondary | ICD-10-CM

## 2016-10-01 DIAGNOSIS — R002 Palpitations: Secondary | ICD-10-CM

## 2016-10-01 MED ORDER — DILTIAZEM HCL ER COATED BEADS 360 MG PO CP24: 360.0000 mg | ORAL_CAPSULE | Freq: Every day | ORAL | 6 refills | Status: DC

## 2016-10-01 NOTE — Telephone Encounter (Signed)
Received incoming call from Mickel Baas, Software engineer at [employer]. The Internet is down at their store. Verbal order for Diltiazem CD 360 mg (1 capsule) by mouth daily, 30 capsule, 6 refills given over the phone. She read back and verified.

## 2016-10-01 NOTE — Patient Instructions (Signed)
Medication Instructions:  Your physician has recommended you make the following change in your medication:  1. INCREASE Diltiazem 360 mg once daily  Follow-Up: Your physician recommends that you schedule a follow-up appointment in: 3 months with Dr. Rockey Situ.  It was a pleasure seeing you today here in the office. Please do not hesitate to give [place] a call back if you have any further questions. [place], BSN

## 2016-10-01 NOTE — Progress Notes (Signed)
-   Office Visit    Patient Name: Neil Bush Date of Encounter: 10/01/2016  Primary Care Provider:  Guadalupe Maple, MD Primary Cardiologist:  Formerly A. Yvone Neu, MD - to f/u with Johnny Bridge, MD   Chief Complaint    66 year old male with a prior history of paroxysmal atrial fibrillation, hypertension, hyperlipidemia, and chest pain who presents for follow-up related to palpitations after wearing Zio patch.  Past Medical History    Past Medical History:  Diagnosis Date  . Chest pain    a. 01/2016 in setting of Afib;  b. 02/2016 Ex MV: EF 55-65%, no ischemia.  . Hyperlipidemia   . Hypertension   . PAF (paroxysmal atrial fibrillation) ([place])    a. 01/2016 - admission for rapid AF and c/p-->converted on IV dilt;  b. CHA2DS2VASc = 2-->Xarelto;  c. 01/2016 Echo: EF 55-60%, triv AI.  Marland Kitchen Palpitations    a. 08/2016 Zio Patch: symptomatic PAC's and brief (max 11 beats) of SVT/PAT (3 episodes).   Past Surgical History:  Procedure Laterality Date  . CARDIAC CATHETERIZATION     [facility]   . CHOLECYSTECTOMY    . EYE SURGERY    . FOOT SURGERY    . HERNIA REPAIR     X 2 ingunial  . KNEE SURGERY Right     Allergies  Allergies  Allergen Reactions  . Penicillins Rash    Has patient had a PCN reaction causing immediate rash, facial/tongue/throat swelling, SOB or lightheadedness with hypotension: no Has patient had a PCN reaction causing severe rash involving mucus membranes or skin necrosis: no Has patient had a PCN reaction that required hospitalization no Has patient had a PCN reaction occurring within the last 10 years: no If all of the above answers are "NO", then may proceed with Cephalosporin use.    Childhood allergy    History of Present Illness    66 year old male with a history of hypertension, hyperlipidemia and paroxysmal atrial fibrillation, diagnosed in November 2017. At that time, he was admitted with chest pain and A. fib and converted to sinus rhythm on IV diltiazem.  He has been anticoagulated with Xarelto in the setting of a CHA2DS2VASc of 2. This was followed by stress testing which did not show ischemia. Echo showed normal LV function. I recently saw him in clinic in July where he reported intermittent skipped beats. He was concerned that maybe he was having more atrial fibrillation though was not having any prolonged episodes of irregular heartbeats, just an occasional skipped beat. In that setting, we placed an event monitor which showed symptomatic PACs and 3 short runs of SVT/atrial tachycardia with a maximum of 11 beats. He did not have any atrial fibrillation on this monitor. While wearing the monitor, we did add short-acting diltiazem 30 mg every 6 hours when necessary and he has been taking that 3 times a day in addition to diltiazem 240 mg. Following addition of short-acting diltiazem, he has noted some reduction in palpitations though not complete resolution. Other than dyspnea and mild chest discomfort that he experiences in the setting of palpitations, he has been feeling well and denies PND, orthopnea, dizziness, syncope, edema, or early satiety.  Home Medications    Prior to Admission medications   Medication Sig Start Date End Date Taking? Authorizing Provider  atorvastatin (LIPITOR) 40 MG tablet Take 1 tablet (40 mg total) by mouth daily at 6 PM. 01/21/16  Yes Crissman, Jeannette How, MD  diltiazem (CARDIZEM) 30 MG tablet Take 1  tablet (30 mg total) by mouth 4 (four) times daily as needed (for palpitations). 09/22/16  Yes Rogelia Mire, NP  lisinopril (PRINIVIL,ZESTRIL) 10 MG tablet Take 1 tablet (10 mg total) by mouth daily. Patient taking differently: Take 10 mg by mouth 2 (two) times daily.  02/11/16 10/01/16 Yes Minna Merritts, MD  meloxicam (MOBIC) 15 MG tablet  06/24/16  Yes [provider]  omeprazole (PRILOSEC) 20 MG capsule Take 1 capsule (20 mg total) by mouth daily. 07/23/16  Yes Crissman, Jeannette How, MD  rivaroxaban (XARELTO) 20 MG  TABS tablet Take 1 tablet (20 mg total) by mouth daily. 05/22/16  Yes Wende Bushy, MD  diltiazem (CARDIZEM CD) 360 MG 24 hr capsule Take 1 capsule (360 mg total) by mouth daily. 10/01/16   Rogelia Mire, NP    Review of Systems    Palpitations as outlined above. When these occur, he does feel some shortness of breath and sometimes mild chest discomfort similar to what he experienced when he had atrial fibrillation last year.  All other systems reviewed and are otherwise negative except as noted above.  Physical Exam    VS:  BP 116/80 (BP Location: Left Arm, Patient Position: Sitting, Cuff Size: Normal)   Pulse 67   Ht 5\' 10"  (1.778 m)   Wt 184 lb (83.5 kg)   BMI 26.40 kg/m  , BMI Body mass index is 26.4 kg/m. GEN: Well nourished, well developed, in no acute distress.  HEENT: normal.  Neck: Supple, no JVD, carotid bruits, or masses. Cardiac: RRR, no murmurs, rubs, or gallops. No clubbing, cyanosis, edema.  Radials/DP/PT 2+ and equal bilaterally.  Respiratory:  Respirations regular and unlabored, clear to auscultation bilaterally. GI: Soft, nontender, nondistended, BS + x 4. MS: no deformity or atrophy. Skin: warm and dry, no rash. Neuro:  Strength and sensation are intact. Psych: Normal affect.  Accessory Clinical Findings    ECG - Regular sinus rhythm, 67, first-degree AV block, no acute ST or T changes.  Lab Results  Component Value Date   CREATININE 1.12 09/17/2016   BUN 20 09/17/2016   NA 139 09/17/2016   K 4.1 09/17/2016   CL 102 09/17/2016   CO2 20 09/17/2016   Lab Results  Component Value Date   TSH 1.480 09/17/2016   _____________  Event Monitor 7.27.2018  Normal sinus rhythm Avg HR of 68 bpm.  3  Short Supraventricular Tachycardia runs occurred, the longest was 11 beats Rare PVC  Assessment & Plan    1.  Palpitations/premature atrial contractions: Patient was seen a few weeks ago secondary to a several week history of increasing palpitations and  skipped beats. Monitoring revealed symptomatic PACs and at least 3 brief runs of SVT (maximum 11 beats). We went over his monitoring in detail today. His wife was present for the discussion as well. I reassured him that monitoring did not show any new atrial fibrillation. In that setting, we'll focus on suppression of PACs have asked him to increase his diltiazem CD 360 mg daily. He will also look to cut caffeine out of his day. If he were to continue to have significant burden of palpitations I would look to add a low-dose of beta blocker on top of calcium channel blocker therapy. For now, he will take the higher dose of long-acting diltiazem and continue to use short acting diltiazem as needed. He will contact us if he is frequently using the short-acting diltiazem.   2. Paroxysmal atrial fibrillation: No atrial  fibrillation noted on monitoring. Continue calcium channel blocker and Xarelto.  3. Essential hypertension: Blood pressure is stable. His blood pressures drop on the higher dose of diltiazem, we could look to reduce his lisinopril.  4. Hyperlipidemia: LDL 88 and November 2017. Continue statin therapy.  5. Disposition: Follow-up in 2-3 months or sooner if necessary.   Murray Hodgkins, NP 10/01/2016, 11:42 AM

## 2016-10-01 NOTE — Telephone Encounter (Signed)
Pharmacy calling stating they were told by patient we were to send in some prescriptions for him But the Internet is down at the pharmacy  They are hoping to get a verbal order   Please advise.   Pt is there waiting

## 2016-10-16 ENCOUNTER — Ambulatory Visit
Admission: RE | Admit: 2016-10-16 | Discharge: 2016-10-16 | Disposition: A | Discharge status: Home / Self Care | Payer: PPO | Source: Ambulatory Visit | Attending: Family Medicine | Admitting: Family Medicine

## 2016-10-16 ENCOUNTER — Ambulatory Visit (INDEPENDENT_AMBULATORY_CARE_PROVIDER_SITE_OTHER): Payer: PPO | Admitting: Family Medicine

## 2016-10-16 ENCOUNTER — Telehealth: Payer: Self-pay | Admitting: Family Medicine

## 2016-10-16 ENCOUNTER — Encounter: Payer: Self-pay | Admitting: Family Medicine

## 2016-10-16 VITALS — BP 151/79 | HR 62 | Temp 98.0°F | Wt 182.0 lb

## 2016-10-16 DIAGNOSIS — M25562 Pain in left knee: Secondary | ICD-10-CM

## 2016-10-16 MED ORDER — DICLOFENAC SODIUM 1 % TD GEL: 2.0000 g | Freq: Four times a day (QID) | TRANSDERMAL | 1 refills | Status: DC

## 2016-10-16 MED ORDER — PREDNISONE 10 MG PO TABS: ORAL_TABLET | ORAL | 0 refills | Status: DC

## 2016-10-16 NOTE — Patient Instructions (Signed)
Follow up as needed

## 2016-10-16 NOTE — Telephone Encounter (Signed)
HIPPA verified, wife notified of results.

## 2016-10-16 NOTE — Telephone Encounter (Signed)
Please call and let him know his knee x-ray came back normal

## 2016-10-16 NOTE — Progress Notes (Signed)
   BP (!) 151/79   Pulse 62   Temp 98 F (36.7 C)   Wt 182 lb (82.6 kg)   SpO2 97%   BMI 26.11 kg/m    Subjective:    Patient ID: Neil Bush, male    DOB: [birth year], 66 y.o.   MRN: [id number]  HPI: Neil Bush is a 66 y.o. male  Chief Complaint  Patient presents with  . Knee Pain    left knee x 1 month. No known injury. Started getting tender. Dull pain, hurts to climb steps into his truck. Meloxicam doesn't help much. Ice makes it worse.   Patient presents with 1 month of medial left knee pain. 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 Denies known injury, bruising, swelling, locking up, inability to bear weight. Has been trying ice and meloxicam, ice seems to make it worst and meloxicam only helped minimally.   Relevant past medical, surgical, family and social history reviewed and updated as indicated. Interim medical history since our last visit reviewed. Allergies and medications reviewed and updated.  Review of Systems  Constitutional: Negative.   Eyes: Negative.   Respiratory: Negative.   Cardiovascular: Negative.   Musculoskeletal: Positive for arthralgias.  Neurological: Negative.   Psychiatric/Behavioral: Negative.    Per HPI unless specifically indicated above     Objective:    BP (!) 151/79   Pulse 62   Temp 98 F (36.7 C)   Wt 182 lb (82.6 kg)   SpO2 97%   BMI 26.11 kg/m   Wt Readings from Last 3 Encounters:  10/16/16 182 lb (82.6 kg)  10/01/16 184 lb (83.5 kg)  09/17/16 184 lb (83.5 kg)    Physical Exam  Constitutional: He is oriented to person, place, and time. He appears well-developed and well-nourished. No distress.  Neck: Normal range of motion. Neck supple.  Cardiovascular: Normal rate and normal heart sounds.   Pulmonary/Chest: Effort normal and breath sounds normal. No respiratory distress.  Musculoskeletal: Normal range of motion. He exhibits  tenderness (minimal ttp over medial joint line). He exhibits no edema.  Crepitus of left knee with PROM Non-tender to varus and valgus stress No joint instability noted  Neurological: He is alert and oriented to person, place, and time.  Skin: Skin is warm and dry.  Psychiatric: He has a normal mood and affect. His behavior is normal.  Nursing note and vitals reviewed.     Assessment & Plan:   Problem List Items Addressed This Visit    None    Visit Diagnoses    Acute pain of left knee    -  Primary   Will get x-ray and start prednisone, diclofenac gel. Lidoderm patches, epsom soaks, modification of movement to get in and out of truck. F/u if no improvement   Relevant Orders   DG Knee Complete 4 Views Left (Completed)       Follow up plan: Return if symptoms worsen or fail to improve.

## 2016-10-29 ENCOUNTER — Telehealth: Payer: Self-pay | Admitting: Cardiovascular Disease

## 2016-10-29 NOTE — Telephone Encounter (Signed)
°*  STAT* If patient is at the pharmacy, call can be transferred to refill team.   1. Which medications need to be refilled? (please list name of each medication and dose if known)  Metoprolol 50 mg 1 a day  2. Which pharmacy/location (including street and city if local pharmacy) is medication to be sent to? [facility]   3. Do they need a 30 day or 90 day supply? 90 day

## 2016-10-29 NOTE — Telephone Encounter (Signed)
Pt has not been prescribed metoprolol by a provider in our office.  Called pt. He answered but then we were disconnected. Called again. Left voice mail message for pt to call the office.

## 2016-10-29 NOTE — Telephone Encounter (Signed)
Please advise I do not see where this patient has been on this medication.

## 2016-10-30 ENCOUNTER — Telehealth: Payer: Self-pay | Admitting: Nurse Practitioner

## 2016-10-30 MED ORDER — METOPROLOL SUCCINATE ER 50 MG PO TB24: 50.0000 mg | ORAL_TABLET | Freq: Every day | ORAL | 3 refills | Status: DC

## 2016-10-30 NOTE — Telephone Encounter (Signed)
No answer. Left message to call back.  Discontinued Diltiazem daily and placed Rx for metoprolol.

## 2016-10-30 NOTE — Telephone Encounter (Signed)
I am tickled for him.  Ok to refill Toprol XL 50 mg daily.

## 2016-10-30 NOTE — Telephone Encounter (Signed)
Returned call to patient. At last office visit with Ignacia Bayley on 10/01/16, Diltiazem was increased to 360 mg daily. Patient said he took it up to 5 days ago and his heart was still skipping a lot like before. He decided to stop the diltiazem and go back to taking Metoprolol succinate 50 mg daily. He says he is "tickled to death" because it is working. His heart is not skipping now for the past 5 days in a row that he has taken it. He admitted that he did not take the metoprolol like he was supposed to in the past. He would skip a day or only take half.  Now that he's taking it daily, it is working. Patient would like to see if we could switch him back to metoprolol and send in a new prescription.  Advised patient I would route to [facility] for advice and let him know the outcome. He was very Patent attorney.

## 2016-10-30 NOTE — Telephone Encounter (Signed)
Pt would like to switch from Cardizem to Metoprolol. Please call.

## 2016-11-04 NOTE — Telephone Encounter (Signed)
S/w patient. He did get the message and then new prescription and was very appreciative.

## 2016-11-25 DIAGNOSIS — H1132 Conjunctival hemorrhage, left eye: Secondary | ICD-10-CM | POA: Diagnosis not present

## 2016-12-08 ENCOUNTER — Telehealth: Payer: Self-pay | Admitting: Cardiovascular Disease

## 2016-12-08 NOTE — Telephone Encounter (Signed)
Pt calling stating he went to pick up xarelto at [facility]  He states the price went from $ 45 for 30 day supply to $137 for 30 day supply He is completley out.   He would like to know if there is another medication he can take which is more affordable Please advise.

## 2016-12-08 NOTE — Telephone Encounter (Signed)
Please review with patient his medication options.

## 2016-12-08 NOTE — Telephone Encounter (Signed)
Patient not home. S/w wife, ok per dpr. Patient unhappy because his Xarelto has gone up in price due to doughnut hole. Wife had actually brought home the prescription she paid for and patient took it back because it was so expensive. Gave her the numbers for patient assistance and [facility]. Advised it may be necessary to buy this month's supply in order to give time to fill out paperwork for assistance. She agreed; however, saying patient may not be willing to pay the amount. She verbalized understanding of risks if patient does not take the Xarelto. She suggested I try calling the patient's cell phone.  Spoke with patient. He verbalized understanding of the above information as well. He agreed that he would go ahead and "bite the bullet" and pay this month and then look into the patient assistance. He asked about coumadin as an option. I let him know if requires routine blood testing and appointment. He did not particularly care for the thought routine office visits. I asked for him to please call us if he is unable to get assistance so that we can come up with a plan in order to keep him on a blood thinner.

## 2016-12-31 NOTE — Progress Notes (Signed)
Cardiology Office Note  Date:  01/01/2017   ID:  SAVOY SOMERVILLE, DOB May 05, 1950, MRN [id number]  PCP:  Guadalupe Maple, MD   Chief Complaint  Patient presents with  . other    3 month f/u no complaints today. Meds reviewed verbally with pt.    HPI:  66 year old male with a prior history of  paroxysmal atrial fibrillation, 01/26/2016 hypertension,  hyperlipidemia,  chest pain  who presents for follow-up of his atrial fibrillation  November 2017 he was admitted with chest pain and A. fib and converted to sinus rhythm on IV diltiazem.  Started on Xarelto in the setting of a CHA2DS2VASc of 2.   Studies performed stress testing which did not show ischemia.  Echo showed normal LV function.   event monitor which showed symptomatic PACs and 3 short runs of SVT/atrial tachycardia with a maximum of 11 beats.  He did not have any atrial fibrillation on this monitor.   He continued to have paroxysmal atrial fibrillation, As an outpatient with change from diltiazem to metoprolol On metoprolol he reports having dramatically improved tachycardia and palpitations  Previously on lisinopril, which she stopped for hypotension But now taking only metoprolol 25 mg daily, denies any tachycardia  EKG personally reviewed by myself on todays visit Shows normal sinus rhythm rate 58 bpm no significant ST or T wave changes   PMH:   has a past medical history of Chest pain; Hyperlipidemia; Hypertension; PAF (paroxysmal atrial fibrillation) ([place]); and Palpitations.  PSH:    Past Surgical History:  Procedure Laterality Date  . CARDIAC CATHETERIZATION     [facility]   . CHOLECYSTECTOMY    . EYE SURGERY    . FOOT SURGERY    . HERNIA REPAIR     X 2 ingunial  . KNEE SURGERY Right     Current Outpatient Prescriptions  Medication Sig Dispense Refill  . metoprolol succinate (TOPROL-XL) 50 MG 24 hr tablet Take 1 tablet (50 mg total) by mouth daily. Take with or immediately following a meal. 90 tablet 3   . rivaroxaban (XARELTO) 20 MG TABS tablet Take 1 tablet (20 mg total) by mouth daily. 30 tablet 6   No current facility-administered medications for this visit.      Allergies:   Penicillins   Social History:  The patient  reports that he has never smoked. He has never used smokeless tobacco. He reports that he does not drink alcohol or use drugs.   Family History:   family history includes Cancer in his mother; Diabetes in his father; Heart disease in his brother and father; Hypertension in his father.    Review of Systems: Review of Systems  Constitutional: Negative.   Respiratory: Negative.   Cardiovascular: Negative.   Gastrointestinal: Negative.   Musculoskeletal: Negative.   Neurological: Negative.   Psychiatric/Behavioral: Negative.   All other systems reviewed and are negative.    PHYSICAL EXAM: VS:  BP (!) 148/90 (BP Location: Left Arm, Patient Position: Sitting, Cuff Size: Normal)   Pulse (!) 58   Ht 5\' 10"  (1.778 m)   Wt 181 lb 8 oz (82.3 kg)   BMI 26.04 kg/m  , BMI Body mass index is 26.04 kg/m. GEN: Well nourished, well developed, in no acute distress  HEENT: normal  Neck: no JVD, carotid bruits, or masses Cardiac: RRR; no murmurs, rubs, or gallops,no edema  Respiratory:  clear to auscultation bilaterally, normal work of breathing GI: soft, nontender, nondistended, + BS MS: no deformity or atrophy  Skin: warm and dry, no rash Neuro:  Strength and sensation are intact Psych: euthymic mood, full affect    Recent Labs: 01/26/2016: Hemoglobin 18.1; Platelets 255 07/23/2016: ALT (SGPT) [place], Waived 21 09/17/2016: BUN 20; Creatinine, Ser 1.12; Magnesium 2.0; Potassium 4.1; Sodium 139; TSH 1.480    Lipid Panel Lab Results  Component Value Date   CHOL 171 07/23/2016   HDL 44 01/21/2016   LDLCALC 88 01/21/2016   TRIG 188 (H) 07/23/2016      Wt Readings from Last 3 Encounters:  01/01/17 181 lb 8 oz (82.3 kg)  10/16/16 182 lb (82.6 kg)  10/01/16  184 lb (83.5 kg)       ASSESSMENT AND PLAN:  Paroxysmal atrial fibrillation ([place]) - Plan: EKG 12-Lead Recommended he stay on Xarelto 20 mg daily He will stay on metoprolol 25 mg p.o. Daily  Essential hypertension Blood pressure is well controlled on today's visit. No changes made to the medications.  Mixed hyperlipidemia Currently not on a statin  Chest pain, unspecified type Denies any chest pain symptoms No further workup at this time  Disposition:   F/U  12 months   Total encounter time more than 25 minutes  Greater than 50% was spent in counseling and coordination of care with the patient    Orders Placed This Encounter  Procedures  . EKG 12-Lead     Signed, Esmond Plants, M.D., Ph.D. 01/01/2017  [place], [place]

## 2017-01-01 ENCOUNTER — Ambulatory Visit (INDEPENDENT_AMBULATORY_CARE_PROVIDER_SITE_OTHER): Payer: PPO | Admitting: Cardiovascular Disease

## 2017-01-01 ENCOUNTER — Encounter: Payer: Self-pay | Admitting: Cardiovascular Disease

## 2017-01-01 VITALS — BP 148/90 | HR 58 | Ht 70.0 in | Wt 181.5 lb

## 2017-01-01 DIAGNOSIS — R079 Chest pain, unspecified: Secondary | ICD-10-CM

## 2017-01-01 DIAGNOSIS — I10 Essential (primary) hypertension: Secondary | ICD-10-CM | POA: Diagnosis not present

## 2017-01-01 DIAGNOSIS — Z23 Encounter for immunization: Secondary | ICD-10-CM

## 2017-01-01 DIAGNOSIS — I480 Paroxysmal atrial fibrillation: Secondary | ICD-10-CM

## 2017-01-01 DIAGNOSIS — E782 Mixed hyperlipidemia: Secondary | ICD-10-CM

## 2017-01-01 NOTE — Patient Instructions (Addendum)
Flu shot  xarelto samples  Medication Instructions:   No medication changes made  Labwork:  No new labs needed  Testing/Procedures:  No further testing at this time   Follow-Up: It was a pleasure seeing you in the office today. Please call us if you have new issues that need to be addressed before your next appt.  [phone]  Your physician wants you to follow-up in: 12 months.  You will receive a reminder letter in the mail two months in advance. If you don't receive a letter, please call our office to schedule the follow-up appointment.  If you need a refill on your cardiac medications before your next appointment, please call your pharmacy.

## 2017-02-05 ENCOUNTER — Encounter: Payer: Self-pay | Admitting: Family Medicine

## 2017-02-05 ENCOUNTER — Ambulatory Visit (INDEPENDENT_AMBULATORY_CARE_PROVIDER_SITE_OTHER): Payer: PPO | Admitting: Family Medicine

## 2017-02-05 VITALS — BP 166/105 | HR 53 | Ht 71.0 in | Wt 181.0 lb

## 2017-02-05 DIAGNOSIS — Z1211 Encounter for screening for malignant neoplasm of colon: Secondary | ICD-10-CM

## 2017-02-05 DIAGNOSIS — I480 Paroxysmal atrial fibrillation: Secondary | ICD-10-CM

## 2017-02-05 DIAGNOSIS — I10 Essential (primary) hypertension: Secondary | ICD-10-CM | POA: Diagnosis not present

## 2017-02-05 DIAGNOSIS — M25569 Pain in unspecified knee: Secondary | ICD-10-CM | POA: Diagnosis not present

## 2017-02-05 DIAGNOSIS — S86911A Strain of unspecified muscle(s) and tendon(s) at lower leg level, right leg, initial encounter: Secondary | ICD-10-CM | POA: Diagnosis not present

## 2017-02-05 DIAGNOSIS — N400 Enlarged prostate without lower urinary tract symptoms: Secondary | ICD-10-CM | POA: Diagnosis not present

## 2017-02-05 LAB — URINALYSIS, ROUTINE W REFLEX MICROSCOPIC
Bilirubin, UA: NEGATIVE
Glucose, UA: NEGATIVE
Ketones, UA: NEGATIVE
LEUKOCYTES UA: NEGATIVE
Nitrite, UA: NEGATIVE
PH UA: 5 (ref 5.0–7.5)
Protein, UA: NEGATIVE
Specific Gravity, UA: 1.025 (ref 1.005–1.030)
Urobilinogen, Ur: 0.2 mg/dL (ref 0.2–1.0)

## 2017-02-05 LAB — MICROSCOPIC EXAMINATION: Bacteria, UA: NONE SEEN

## 2017-02-05 MED ORDER — METOPROLOL SUCCINATE ER 50 MG PO TB24: 50.0000 mg | ORAL_TABLET | Freq: Every day | ORAL | 4 refills | Status: DC

## 2017-02-05 MED ORDER — BUPIVACAINE HCL (PF) 0.5 % IJ SOLN
4.0000 mL | Freq: Once | INTRAMUSCULAR | Status: AC
  Administered 2017-02-05: 4 mL

## 2017-02-05 MED ORDER — LISINOPRIL 20 MG PO TABS: 20.0000 mg | ORAL_TABLET | Freq: Every day | ORAL | 4 refills | Status: DC

## 2017-02-05 MED ORDER — TRIAMCINOLONE ACETONIDE 40 MG/ML IJ SUSP
40.0000 mg | Freq: Once | INTRAMUSCULAR | Status: AC
  Administered 2017-02-05: 40 mg via INTRAMUSCULAR

## 2017-02-05 MED ORDER — RIVAROXABAN 20 MG PO TABS: 20.0000 mg | ORAL_TABLET | Freq: Every day | ORAL | 4 refills | Status: DC

## 2017-02-05 NOTE — Progress Notes (Signed)
BP (!) 166/105   Pulse (!) 53   Ht 5\' 11"  (1.803 m)   Wt 181 lb (82.1 kg)   SpO2 98%   BMI 25.24 kg/m    Subjective:    Patient ID: Neil Bush, male    DOB: January 01, 1951, 66 y.o.   MRN: [id number]  HPI: Neil Bush is a 66 y.o. male  Chief Complaint  Patient presents with  . Annual Exam  . Knee Pain  patient all in all doing well. Is taking metoprolol 50 mg for heart rate and irregular heart rhythm regulation. Has been doing well with that also taking Xarelto without any issues or bleeding concerns. Patient's blood pressure when checked at home is been doing well but certainly elevated here and elsewhere. Patient's biggest concern today is his left medial knee. Especially bothersome get an in and out of his truckand also will wake him up at night 4-5 times. Has tried some Aleve which maybe helps some with decreased waking up. No locking clicking or giving way but does have to be careful goig up and down steps. Also has plantar fasciitis issues and some shoulderdiscomfort. Has had relief with injections in the past. Relevant past medical, surgical, family and social history reviewed and updated as indicated. Interim medical history since our last visit reviewed. Allergies and medications reviewed and updated.  Review of Systems  Constitutional: Negative.   HENT: Negative.   Eyes: Negative.   Respiratory: Negative.   Cardiovascular: Negative.   Gastrointestinal: Negative.   Endocrine: Negative.   Genitourinary: Negative.   Musculoskeletal: Negative.   Skin: Negative.   Allergic/Immunologic: Negative.   Neurological: Negative.   Hematological: Negative.   Psychiatric/Behavioral: Negative.     Per HPI unless specifically indicated above     Objective:    BP (!) 166/105   Pulse (!) 53   Ht 5\' 11"  (1.803 m)   Wt 181 lb (82.1 kg)   SpO2 98%   BMI 25.24 kg/m   Wt Readings from Last 3 Encounters:  02/05/17 181 lb (82.1 kg)  01/01/17 181 lb 8 oz (82.3 kg)    10/16/16 182 lb (82.6 kg)    Physical Exam  Constitutional: He is oriented to person, place, and time. He appears well-developed and well-nourished.  HENT:  Head: Normocephalic and atraumatic.  Right Ear: External ear normal.  Left Ear: External ear normal.  Eyes: Conjunctivae and EOM are normal. Pupils are equal, round, and reactive to light.  Neck: Normal range of motion. Neck supple.  Cardiovascular: Normal rate, regular rhythm, normal heart sounds and intact distal pulses.  Pulmonary/Chest: Effort normal and breath sounds normal.  Abdominal: Soft. Bowel sounds are normal. There is no splenomegaly or hepatomegaly.  Genitourinary: Rectum normal, prostate normal and penis normal.  Musculoskeletal: Normal range of motion.  Patient with left knee medial joint area in the medial collateral ligament area discomfort pain knee exam otherwise normal. After informed consent area was prepped with Betadine and alcohol and medial collateral ligament area was infiltrated with Marcaine and Kenalogwith relief. Patient tolerated the procedure well.  Neurological: He is alert and oriented to person, place, and time. He has normal reflexes.  Skin: No rash noted. No erythema.  Psychiatric: He has a normal mood and affect. His behavior is normal. Judgment and thought content normal.    Results for orders placed or performed in visit on 84/16/60  Basic Metabolic Panel (BMET)  Result Value Ref Range   Glucose 137 (H) 65 -  99 mg/dL   BUN 20 8 - 27 mg/dL   Creatinine, Ser 1.12 0.76 - 1.27 mg/dL   GFR calc non Af Amer 67 >59 mL/min/1.73   GFR calc Af Amer 78 >59 mL/min/1.73   BUN/Creatinine Ratio 18 10 - 24   Sodium 139 134 - 144 mmol/L   Potassium 4.1 3.5 - 5.2 mmol/L   Chloride 102 96 - 106 mmol/L   CO2 20 20 - 29 mmol/L   Calcium 8.9 8.6 - 10.2 mg/dL  Magnesium  Result Value Ref Range   Magnesium 2.0 1.6 - 2.3 mg/dL  TSH  Result Value Ref Range   TSH 1.480 0.450 - 4.500 uIU/mL       Assessment & Plan:   Problem List Items Addressed This Visit      Cardiovascular and Mediastinum   Essential hypertension    With poor control today will restart lisinopril      Relevant Medications   metoprolol succinate (TOPROL-XL) 50 MG 24 hr tablet   rivaroxaban (XARELTO) 20 MG TABS tablet   lisinopril (PRINIVIL,ZESTRIL) 20 MG tablet   Other Relevant Orders   Comprehensive metabolic panel   Lipid panel   CBC with Differential/Platelet   TSH   Urinalysis, Routine w reflex microscopic   Paroxysmal atrial fibrillation (HCC)    Followed by cardiology and stable.      Relevant Medications   metoprolol succinate (TOPROL-XL) 50 MG 24 hr tablet   rivaroxaban (XARELTO) 20 MG TABS tablet   lisinopril (PRINIVIL,ZESTRIL) 20 MG tablet   Other Relevant Orders   Comprehensive metabolic panel   Lipid panel   CBC with Differential/Platelet   TSH   Urinalysis, Routine w reflex microscopic     Genitourinary   BPH (benign prostatic hyperplasia)    stable      Relevant Orders   PSA    Other Visit Diagnoses    Colon cancer screening    -  Primary   Relevant Orders   Ambulatory referral to Gastroenterology   Knee pain, unspecified chronicity, unspecified laterality       Relevant Medications   triamcinolone acetonide (KENALOG-40) injection 40 mg (Completed)   bupivacaine (MARCAINE) 0.5 % injection 4 mL (Completed)   Knee strain, right, initial encounter         discuss knee strain and because of taking Xarelto not to take any nonsteroidal anti-inflammatory agents. Had no response to Voltaren gel. We will inject see procedure note above.  Follow up plan: Return in about 2 months (around 04/08/2017) for BMP.

## 2017-02-05 NOTE — Assessment & Plan Note (Signed)
With poor control today will restart lisinopril

## 2017-02-05 NOTE — Assessment & Plan Note (Signed)
Followed by cardiology and stable 

## 2017-02-05 NOTE — Assessment & Plan Note (Signed)
stable °

## 2017-02-06 LAB — COMPREHENSIVE METABOLIC PANEL
A/G RATIO: 1.7 (ref 1.2–2.2)
ALT: 14 IU/L (ref 0–44)
AST: 16 IU/L (ref 0–40)
Albumin: 4.4 g/dL (ref 3.6–4.8)
Alkaline Phosphatase: 97 IU/L (ref 39–117)
BUN/Creatinine Ratio: 17 (ref 10–24)
BUN: 18 mg/dL (ref 8–27)
Bilirubin Total: 0.8 mg/dL (ref 0.0–1.2)
CALCIUM: 8.9 mg/dL (ref 8.6–10.2)
CO2: 25 mmol/L (ref 20–29)
Chloride: 101 mmol/L (ref 96–106)
Creatinine, Ser: 1.07 mg/dL (ref 0.76–1.27)
GFR, EST AFRICAN AMERICAN: 82 mL/min/{1.73_m2} (ref >59)
GFR, EST NON AFRICAN AMERICAN: 71 mL/min/{1.73_m2} (ref >59)
GLOBULIN, TOTAL: 2.6 g/dL (ref 1.5–4.5)
Glucose: 66 mg/dL (ref 65–99)
POTASSIUM: 4.5 mmol/L (ref 3.5–5.2)
SODIUM: 142 mmol/L (ref 134–144)
TOTAL PROTEIN: 7 g/dL (ref 6.0–8.5)

## 2017-02-06 LAB — CBC WITH DIFFERENTIAL/PLATELET
BASOS ABS: 0.1 10*3/uL (ref 0.0–0.2)
Basos: 1 %
EOS (ABSOLUTE): 0.2 10*3/uL (ref 0.0–0.4)
Eos: 3 %
HEMOGLOBIN: 17.1 g/dL (ref 13.0–17.7)
Hematocrit: 49.1 % (ref 37.5–51.0)
Immature Grans (Abs): 0 10*3/uL (ref 0.0–0.1)
Immature Granulocytes: 0 %
LYMPHS ABS: 2.1 10*3/uL (ref 0.7–3.1)
LYMPHS: 33 %
MCH: 32.8 pg (ref 26.6–33.0)
MCHC: 34.8 g/dL (ref 31.5–35.7)
MCV: 94 fL (ref 79–97)
MONOCYTES: 8 %
Monocytes Absolute: 0.5 10*3/uL (ref 0.1–0.9)
Neutrophils Absolute: 3.4 10*3/uL (ref 1.4–7.0)
Neutrophils: 55 %
PLATELETS: 250 10*3/uL (ref 150–379)
RBC: 5.22 x10E6/uL (ref 4.14–5.80)
RDW: 13.5 % (ref 12.3–15.4)
WBC: 6.3 10*3/uL (ref 3.4–10.8)

## 2017-02-06 LAB — LIPID PANEL
Chol/HDL Ratio: 5.3 ratio — ABNORMAL HIGH (ref 0.0–5.0)
Cholesterol, Total: 211 mg/dL — ABNORMAL HIGH (ref 100–199)
HDL: 40 mg/dL (ref >39)
LDL Calculated: 143 mg/dL — ABNORMAL HIGH (ref 0–99)
Triglycerides: 141 mg/dL (ref 0–149)
VLDL Cholesterol Cal: 28 mg/dL (ref 5–40)

## 2017-02-06 LAB — TSH: TSH: 2.11 u[IU]/mL (ref 0.450–4.500)

## 2017-02-06 LAB — PSA: Prostate Specific Ag, Serum: 0.7 ng/mL (ref 0.0–4.0)

## 2017-02-10 ENCOUNTER — Telehealth: Payer: Self-pay | Admitting: Family Medicine

## 2017-02-10 NOTE — Telephone Encounter (Signed)
Copied from [place] [id number]. Topic: Quick Communication - See Telephone Encounter >> Feb 10, 2017  1:37 PM Boyd Kerbs wrote: CRM for notification. See Telephone encounter for:  Patient returned call says maybe for results. No CRM  02/10/17.

## 2017-02-11 ENCOUNTER — Telehealth: Payer: Self-pay | Admitting: Family Medicine

## 2017-02-11 DIAGNOSIS — E7800 Pure hypercholesterolemia, unspecified: Secondary | ICD-10-CM

## 2017-02-11 MED ORDER — ATORVASTATIN CALCIUM 40 MG PO TABS: 40.0000 mg | ORAL_TABLET | Freq: Every day | ORAL | 4 refills | Status: DC

## 2017-02-11 NOTE — Telephone Encounter (Signed)
Hone call Discussed with patient elevated cholesterol patient will get back on his atorvastatin.

## 2017-02-16 ENCOUNTER — Other Ambulatory Visit: Payer: Self-pay

## 2017-02-16 DIAGNOSIS — Z1211 Encounter for screening for malignant neoplasm of colon: Secondary | ICD-10-CM

## 2017-02-16 NOTE — Progress Notes (Signed)
Gastroenterology Pre-Procedure Review  Request Date: 03/20/17 Requesting Physician: Dr. Allen Norris  PATIENT REVIEW QUESTIONS: The patient responded to the following health history questions as indicated:    1. Are you having any GI issues? no 2. Do you have a personal history of Polyps? yes (had several polyps previous colonnoscopy) 3. Do you have a family history of Colon Cancer or Polyps? no 4. Diabetes Mellitus? no 5. Joint replacements in the past 12 months?no 6. Major health problems in the past 3 months?no 7. Any artificial heart valves, MVP, or defibrillator?no    MEDICATIONS & ALLERGIES:    Patient reports the following regarding taking any anticoagulation/antiplatelet therapy:   Plavix, Coumadin, Eliquis, Xarelto, Lovenox, Pradaxa, Brilinta, or Effient? yes (Xarelto prescribed by Dr. Rockey Situ Blood Thinner Request Sent) Aspirin? no  Patient confirms/reports the following medications:  Current Outpatient Medications  Medication Sig Dispense Refill  . atorvastatin (LIPITOR) 40 MG tablet Take 1 tablet (40 mg total) by mouth daily at 6 PM. 90 tablet 4  . lisinopril (PRINIVIL,ZESTRIL) 20 MG tablet Take 1 tablet (20 mg total) by mouth daily. 90 tablet 4  . metoprolol succinate (TOPROL-XL) 50 MG 24 hr tablet Take 1 tablet (50 mg total) by mouth daily. Take with or immediately following a meal. 90 tablet 4  . rivaroxaban (XARELTO) 20 MG TABS tablet Take 1 tablet (20 mg total) by mouth daily. 90 tablet 4   No current facility-administered medications for this visit.     Patient confirms/reports the following allergies:  Allergies  Allergen Reactions  . Penicillins Rash    Has patient had a PCN reaction causing immediate rash, facial/tongue/throat swelling, SOB or lightheadedness with hypotension: no Has patient had a PCN reaction causing severe rash involving mucus membranes or skin necrosis: no Has patient had a PCN reaction that required hospitalization no Has patient had a PCN  reaction occurring within the last 10 years: no If all of the above answers are "NO", then may proceed with Cephalosporin use.    Childhood allergy    Orders Placed This Encounter  Procedures  . Procedural/ Surgical Case Request: COLONOSCOPY WITH PROPOFOL    Standing Status:   Standing    Number of Occurrences:   1    Order Specific Question:   Pre-op diagnosis    Answer:   screening colonoscopy    Order Specific Question:   CPT Code    Answer:   z12.11    AUTHORIZATION INFORMATION Primary Insurance: 1D#: Group #:  Secondary Insurance: 1D#: Group #:  SCHEDULE INFORMATION: Date: 03/20/17 Time: Location:[facility]

## 2017-02-18 ENCOUNTER — Telehealth: Payer: Self-pay | Admitting: Gastroenterology

## 2017-02-18 ENCOUNTER — Telehealth: Payer: Self-pay

## 2017-02-18 NOTE — Telephone Encounter (Signed)
-----   Message from Minna Merritts, MD sent at 02/16/2017  8:23 PM EST ----- Ok for procedure Would stop xarelto 2 days prior to the procedure Restart when approved by Dr, Allen Norris thx Brantley Fling  ----- Message ----- From: Vanetta Mulders, CMA Sent: 02/16/2017   2:54 PM To: Minna Merritts, MD

## 2017-02-18 NOTE — Telephone Encounter (Signed)
Pt has been left a voice message to let him know Dr. Rockey Situ has given permission to stop Xarelto 2 days before colonoscopy and resume when Dr. Allen Norris approves.  Thanks [employer]

## 2017-02-18 NOTE — Telephone Encounter (Signed)
Patient called back to schedule colonoscopy.  ?

## 2017-02-23 NOTE — Progress Notes (Signed)
Minna Merritts, MD  Vanetta Mulders, CMA  Cc: Des[facility]        [facility] for procedure  Would stop xarelto 2 days prior to the procedure  Restart when approved by Dr, Allen Norris  thx  Brantley Fling

## 2017-03-16 NOTE — Discharge Instructions (Signed)
General Anesthesia, Adult, Care After °These instructions provide you with information about caring for yourself after your procedure. Your health care provider may also give you more specific instructions. Your treatment has been planned according to current medical practices, but problems sometimes occur. Call your health care provider if you have any problems or questions after your procedure. °What can I expect after the procedure? °After the procedure, it is common to have: °· Vomiting. °· A sore throat. °· Mental slowness. ° °It is common to feel: °· Nauseous. °· Cold or shivery. °· Sleepy. °· Tired. °· Sore or achy, even in parts of your body where you did not have surgery. ° °Follow these instructions at home: °For at least 24 hours after the procedure: °· Do not: °? Participate in activities where you could fall or become injured. °? Drive. °? Use heavy machinery. °? Drink alcohol. °? Take sleeping pills or medicines that cause drowsiness. °? Make important decisions or sign legal documents. °? Take care of children on your own. °· Rest. °Eating and drinking °· If you vomit, drink water, juice, or soup when you can drink without vomiting. °· Drink enough fluid to keep your urine clear or pale yellow. °· Make sure you have little or no nausea before eating solid foods. °· Follow the diet recommended by your health care provider. °General instructions °· Have a responsible adult stay with you until you are awake and alert. °· Return to your normal activities as told by your health care provider. Ask your health care provider what activities are safe for you. °· Take over-the-counter and prescription medicines only as told by your health care provider. °· If you smoke, do not smoke without supervision. °· Keep all follow-up visits as told by your health care provider. This is important. °Contact a health care provider if: °· You continue to have nausea or vomiting at home, and medicines are not helpful. °· You  cannot drink fluids or start eating again. °· You cannot urinate after 8-12 hours. °· You develop a skin rash. °· You have fever. °· You have increasing redness at the site of your procedure. °Get help right away if: °· You have difficulty breathing. °· You have chest pain. °· You have unexpected bleeding. °· You feel that you are having a life-threatening or urgent problem. °This information is not intended to replace advice given to you by your health care provider. Make sure you discuss any questions you have with your health care provider. °Document Released: 05/26/2000 Document Revised: 07/23/2015 Document Reviewed: 02/01/2015 °Elsevier Interactive Patient Education © 2018 [employer] ° °

## 2017-04-09 ENCOUNTER — Ambulatory Visit (INDEPENDENT_AMBULATORY_CARE_PROVIDER_SITE_OTHER): Payer: PPO | Admitting: Family Medicine

## 2017-04-09 ENCOUNTER — Encounter: Payer: Self-pay | Admitting: Family Medicine

## 2017-04-09 VITALS — BP 137/87 | HR 61 | Wt 183.0 lb

## 2017-04-09 DIAGNOSIS — I480 Paroxysmal atrial fibrillation: Secondary | ICD-10-CM | POA: Diagnosis not present

## 2017-04-09 DIAGNOSIS — E782 Mixed hyperlipidemia: Secondary | ICD-10-CM | POA: Diagnosis not present

## 2017-04-09 DIAGNOSIS — Z1159 Encounter for screening for other viral diseases: Secondary | ICD-10-CM

## 2017-04-09 DIAGNOSIS — I10 Essential (primary) hypertension: Secondary | ICD-10-CM

## 2017-04-09 LAB — LP+ALT+AST PICCOLO, WAIVED
ALT (SGPT) Piccolo, Waived: 27 U/L (ref 10–47)
AST (SGOT) PICCOLO, WAIVED: 19 U/L (ref 11–38)
CHOL/HDL RATIO PICCOLO,WAIVE: 3.2 mg/dL
CHOLESTEROL PICCOLO, WAIVED: 144 mg/dL (ref <200)
HDL Chol Piccolo, Waived: 45 mg/dL — ABNORMAL LOW (ref >59)
LDL CHOL CALC PICCOLO WAIVED: 70 mg/dL (ref <100)
TRIGLYCERIDES PICCOLO,WAIVED: 141 mg/dL (ref <150)
VLDL Chol Calc Piccolo,Waive: 28 mg/dL (ref <30)

## 2017-04-09 NOTE — Assessment & Plan Note (Signed)
The current medical regimen is effective;  continue present plan and medications.  

## 2017-04-09 NOTE — Progress Notes (Signed)
BP 137/87   Pulse 61   Wt 183 lb (83 kg)   SpO2 97%   BMI 26.26 kg/m    Subjective:    Patient ID: Neil Bush, male    DOB: [birth year], 67 y.o.   MRN: [id number]  HPI: Neil Bush is a 67 y.o. male  Chief Complaint  Patient presents with  . Hypertension  Patient doing well follow-up blood pressure taking lisinopril and metoprolol without problems and faithfully good control of blood pressure. Has restarted atorvastatin 20 mg at half of a 40 without problems or issues. Taking Xarelto 20 mg without problems or issues no bleeding bruising problems or concerns.   Relevant past medical, surgical, family and social history reviewed and updated as indicated. Interim medical history since our last visit reviewed. Allergies and medications reviewed and updated.  Review of Systems  Constitutional: Negative.   Respiratory: Negative.   Cardiovascular: Negative.     Per HPI unless specifically indicated above     Objective:    BP 137/87   Pulse 61   Wt 183 lb (83 kg)   SpO2 97%   BMI 26.26 kg/m   Wt Readings from Last 3 Encounters:  04/09/17 183 lb (83 kg)  02/05/17 181 lb (82.1 kg)  01/01/17 181 lb 8 oz (82.3 kg)    Physical Exam  Constitutional: He is oriented to person, place, and time. He appears well-developed and well-nourished.  HENT:  Head: Normocephalic and atraumatic.  Eyes: Conjunctivae and EOM are normal.  Neck: Normal range of motion.  Cardiovascular: Normal rate, regular rhythm and normal heart sounds.  Pulmonary/Chest: Effort normal and breath sounds normal.  Musculoskeletal: Normal range of motion.  Neurological: He is alert and oriented to person, place, and time.  Skin: No erythema.  Psychiatric: He has a normal mood and affect. His behavior is normal. Judgment and thought content normal.    Results for orders placed or performed in visit on 02/05/17  Microscopic Examination  Result Value Ref Range   WBC, UA 0-5 0 - 5 /hpf   RBC, UA 0-2 0  - 2 /hpf   Epithelial Cells (non renal) 0-10 0 - 10 /hpf   Bacteria, UA None seen None seen/Few  Comprehensive metabolic panel  Result Value Ref Range   Glucose 66 65 - 99 mg/dL   BUN 18 8 - 27 mg/dL   Creatinine, Ser 1.07 0.76 - 1.27 mg/dL   GFR calc non Af Amer 71 >59 mL/min/1.73   GFR calc Af Amer 82 >59 mL/min/1.73   BUN/Creatinine Ratio 17 10 - 24   Sodium 142 134 - 144 mmol/L   Potassium 4.5 3.5 - 5.2 mmol/L   Chloride 101 96 - 106 mmol/L   CO2 25 20 - 29 mmol/L   Calcium 8.9 8.6 - 10.2 mg/dL   Total Protein 7.0 6.0 - 8.5 g/dL   Albumin 4.4 3.6 - 4.8 g/dL   Globulin, Total 2.6 1.5 - 4.5 g/dL   Albumin/Globulin Ratio 1.7 1.2 - 2.2   Bilirubin Total 0.8 0.0 - 1.2 mg/dL   Alkaline Phosphatase 97 39 - 117 IU/L   AST 16 0 - 40 IU/L   ALT 14 0 - 44 IU/L  Lipid panel  Result Value Ref Range   Cholesterol, Total 211 (H) 100 - 199 mg/dL   Triglycerides 141 0 - 149 mg/dL   HDL 40 >39 mg/dL   VLDL Cholesterol Cal 28 5 - 40 mg/dL   LDL Calculated 143 (  H) 0 - 99 mg/dL   Chol/HDL Ratio 5.3 (H) 0.0 - 5.0 ratio  CBC with Differential/Platelet  Result Value Ref Range   WBC 6.3 3.4 - 10.8 x10E3/uL   RBC 5.22 4.14 - 5.80 x10E6/uL   Hemoglobin 17.1 13.0 - 17.7 g/dL   Hematocrit 49.1 37.5 - 51.0 %   MCV 94 79 - 97 fL   MCH 32.8 26.6 - 33.0 pg   MCHC 34.8 31.5 - 35.7 g/dL   RDW 13.5 12.3 - 15.4 %   Platelets 250 150 - 379 x10E3/uL   Neutrophils 55 Not Estab. %   Lymphs 33 Not Estab. %   Monocytes 8 Not Estab. %   Eos 3 Not Estab. %   Basos 1 Not Estab. %   Neutrophils Absolute 3.4 1.4 - 7.0 x10E3/uL   Lymphocytes Absolute 2.1 0.7 - 3.1 x10E3/uL   Monocytes Absolute 0.5 0.1 - 0.9 x10E3/uL   EOS (ABSOLUTE) 0.2 0.0 - 0.4 x10E3/uL   Basophils Absolute 0.1 0.0 - 0.2 x10E3/uL   Immature Granulocytes 0 Not Estab. %   Immature Grans (Abs) 0.0 0.0 - 0.1 x10E3/uL  TSH  Result Value Ref Range   TSH 2.110 0.450 - 4.500 uIU/mL  Urinalysis, Routine w reflex microscopic  Result Value Ref  Range   Specific Gravity, UA 1.025 1.005 - 1.030   pH, UA 5.0 5.0 - 7.5   Color, UA Yellow Yellow   Appearance Ur Clear Clear   Leukocytes, UA Negative Negative   Protein, UA Negative Negative/Trace   Glucose, UA Negative Negative   Ketones, UA Negative Negative   RBC, UA Trace (A) Negative   Bilirubin, UA Negative Negative   Urobilinogen, Ur 0.2 0.2 - 1.0 mg/dL   Nitrite, UA Negative Negative   Microscopic Examination See below:   PSA  Result Value Ref Range   Prostate Specific Ag, Serum 0.7 0.0 - 4.0 ng/mL      Assessment & Plan:   Problem List Items Addressed This Visit      Cardiovascular and Mediastinum   Essential hypertension - Primary    /The current medical regimen is effective;  continue present plan and medications.       Relevant Orders   Basic metabolic panel   Paroxysmal atrial fibrillation (HCC)    The current medical regimen is effective;  continue present plan and medications.         Other   Hyperlipidemia    The current medical regimen is effective;  continue present plan and medications.       Relevant Orders   LP+ALT+AST Piccolo, [place]    Other Visit Diagnoses    Need for hepatitis C screening test       Relevant Orders   Hepatitis C Antibody       Follow up plan: Return in about 6 months (around 10/07/2017) for BMP,  Lipids, ALT, AST.

## 2017-04-10 LAB — HEPATITIS C ANTIBODY

## 2017-04-10 LAB — BASIC METABOLIC PANEL
BUN/Creatinine Ratio: 15 (ref 10–24)
BUN: 19 mg/dL (ref 8–27)
CALCIUM: 9.1 mg/dL (ref 8.6–10.2)
CHLORIDE: 106 mmol/L (ref 96–106)
CO2: 21 mmol/L (ref 20–29)
CREATININE: 1.24 mg/dL (ref 0.76–1.27)
GFR calc Af Amer: 69 mL/min/{1.73_m2} (ref >59)
GFR calc non Af Amer: 59 mL/min/{1.73_m2} — ABNORMAL LOW (ref >59)
GLUCOSE: 108 mg/dL — AB (ref 65–99)
Potassium: 4.3 mmol/L (ref 3.5–5.2)
Sodium: 142 mmol/L (ref 134–144)

## 2017-04-13 ENCOUNTER — Encounter: Payer: Self-pay | Admitting: Family Medicine

## 2017-04-16 ENCOUNTER — Ambulatory Visit: Payer: PPO | Admitting: Family Medicine

## 2017-04-17 ENCOUNTER — Ambulatory Visit
Admission: RE | Admit: 2017-04-17 | Discharge: 2017-04-17 | Disposition: A | Discharge status: Home / Self Care | Payer: PPO | Source: Ambulatory Visit | Attending: Gastroenterology | Admitting: Gastroenterology

## 2017-04-17 ENCOUNTER — Ambulatory Visit: Payer: PPO | Admitting: Anesthesiology

## 2017-04-17 ENCOUNTER — Encounter
Admission: RE | Disposition: A | Discharge status: Home / Self Care | Payer: Self-pay | Source: Ambulatory Visit | Attending: Gastroenterology

## 2017-04-17 DIAGNOSIS — D123 Benign neoplasm of transverse colon: Secondary | ICD-10-CM | POA: Insufficient documentation

## 2017-04-17 DIAGNOSIS — Z7901 Long term (current) use of anticoagulants: Secondary | ICD-10-CM | POA: Diagnosis not present

## 2017-04-17 DIAGNOSIS — Z1211 Encounter for screening for malignant neoplasm of colon: Secondary | ICD-10-CM | POA: Diagnosis not present

## 2017-04-17 DIAGNOSIS — I480 Paroxysmal atrial fibrillation: Secondary | ICD-10-CM | POA: Insufficient documentation

## 2017-04-17 DIAGNOSIS — N400 Enlarged prostate without lower urinary tract symptoms: Secondary | ICD-10-CM | POA: Diagnosis not present

## 2017-04-17 DIAGNOSIS — I10 Essential (primary) hypertension: Secondary | ICD-10-CM | POA: Insufficient documentation

## 2017-04-17 DIAGNOSIS — D125 Benign neoplasm of sigmoid colon: Secondary | ICD-10-CM | POA: Insufficient documentation

## 2017-04-17 DIAGNOSIS — Z860100 Personal history of colon polyps, unspecified: Secondary | ICD-10-CM

## 2017-04-17 DIAGNOSIS — Z79899 Other long term (current) drug therapy: Secondary | ICD-10-CM | POA: Diagnosis not present

## 2017-04-17 DIAGNOSIS — E785 Hyperlipidemia, unspecified: Secondary | ICD-10-CM | POA: Insufficient documentation

## 2017-04-17 DIAGNOSIS — D122 Benign neoplasm of ascending colon: Secondary | ICD-10-CM | POA: Diagnosis not present

## 2017-04-17 DIAGNOSIS — K635 Polyp of colon: Secondary | ICD-10-CM

## 2017-04-17 HISTORY — PX: POLYPECTOMY: SHX149

## 2017-04-17 HISTORY — DX: Cardiac arrhythmia, unspecified: I49.9

## 2017-04-17 HISTORY — PX: COLONOSCOPY WITH PROPOFOL: SHX5780

## 2017-04-17 HISTORY — DX: Headache: R51

## 2017-04-17 HISTORY — DX: Headache, unspecified: R51.9

## 2017-04-17 SURGERY — COLONOSCOPY WITH PROPOFOL
Anesthesia: General | Wound class: Contaminated

## 2017-04-17 MED ORDER — ACETAMINOPHEN 325 MG PO TABS: 650.0000 mg | ORAL_TABLET | Freq: Once | ORAL | Status: DC | PRN

## 2017-04-17 MED ORDER — ACETAMINOPHEN 160 MG/5ML PO SOLN: 325.0000 mg | ORAL | Status: DC | PRN

## 2017-04-17 MED ORDER — ONDANSETRON HCL 4 MG/2ML IJ SOLN: 4.0000 mg | Freq: Once | INTRAMUSCULAR | Status: DC | PRN

## 2017-04-17 MED ORDER — PROPOFOL 10 MG/ML IV BOLUS
INTRAVENOUS | Status: DC | PRN
  Administered 2017-04-17: 30 mg via INTRAVENOUS
  Administered 2017-04-17: 20 mg via INTRAVENOUS
  Administered 2017-04-17: 30 mg via INTRAVENOUS
  Administered 2017-04-17: 100 mg via INTRAVENOUS
  Administered 2017-04-17 (×2): 20 mg via INTRAVENOUS
  Administered 2017-04-17: 50 mg via INTRAVENOUS
  Administered 2017-04-17 (×4): 30 mg via INTRAVENOUS
  Administered 2017-04-17 (×3): 20 mg via INTRAVENOUS

## 2017-04-17 MED ORDER — LACTATED RINGERS IV SOLN
1000.0000 mL | INTRAVENOUS | Status: DC
  Administered 2017-04-17: 1000 mL via INTRAVENOUS

## 2017-04-17 MED ORDER — LIDOCAINE HCL (CARDIAC) 20 MG/ML IV SOLN
INTRAVENOUS | Status: DC | PRN
  Administered 2017-04-17: 40 mg via INTRAVENOUS

## 2017-04-17 SURGICAL SUPPLY — 24 items
CANISTER SUCT 1200ML W/VALVE (MISCELLANEOUS) ×3 IMPLANT
CLIP HMST 235XBRD CATH ROT (MISCELLANEOUS) ×2 IMPLANT
CLIP RESOLUTION 360 11X235 (MISCELLANEOUS) ×1
ELECT REM PT RETURN 9FT ADLT (ELECTROSURGICAL)
ELECTRODE REM PT RTRN 9FT ADLT (ELECTROSURGICAL) IMPLANT
FCP ESCP3.2XJMB 240X2.8X (MISCELLANEOUS)
FORCEPS BIOP RAD 4 LRG CAP 4 (CUTTING FORCEPS) ×3 IMPLANT
FORCEPS BIOP RJ4 240 W/NDL (MISCELLANEOUS)
FORCEPS ESCP3.2XJMB 240X2.8X (MISCELLANEOUS) IMPLANT
GOWN CVR UNV OPN BCK APRN NK (MISCELLANEOUS) ×4 IMPLANT
GOWN ISOL THUMB LOOP REG UNIV (MISCELLANEOUS) ×2
INJECTOR VARIJECT VIN23 (MISCELLANEOUS) IMPLANT
KIT DEFENDO VALVE AND CONN (KITS) IMPLANT
KIT ENDO PROCEDURE OLY (KITS) ×3 IMPLANT
MARKER SPOT ENDO TATTOO 5ML (MISCELLANEOUS) IMPLANT
PROBE APC STR FIRE (PROBE) IMPLANT
RETRIEVER NET ROTH 2.5X230 LF (MISCELLANEOUS) IMPLANT
SNARE SHORT THROW 13M SML OVAL (MISCELLANEOUS) ×3 IMPLANT
SNARE SHORT THROW 30M LRG OVAL (MISCELLANEOUS) IMPLANT
SNARE SNG USE RND 15MM (INSTRUMENTS) IMPLANT
SPOT EX ENDOSCOPIC TATTOO (MISCELLANEOUS)
TRAP ETRAP POLY (MISCELLANEOUS) ×3 IMPLANT
VARIJECT INJECTOR VIN23 (MISCELLANEOUS)
WATER STERILE IRR 250ML POUR (IV SOLUTION) ×3 IMPLANT

## 2017-04-17 NOTE — Op Note (Signed)
[facility] Patient Name: Neil Bush Procedure Date: 04/17/2017 7:47 AM MRN: [id number] Account #: [id number] Date of Birth: 11-May-1950 Admit Type: Outpatient Age: 67 Room: [facility] OR ROOM 01 Gender: Male Note Status: Finalized Procedure:            Colonoscopy Indications:          High risk colon cancer surveillance: Personal history                        of colonic polyps Providers:            Lucilla Lame MD, MD Referring MD:         Guadalupe Maple, MD (Referring MD) Medicines:            Propofol per Anesthesia Complications:        No immediate complications. Procedure:            Pre-Anesthesia Assessment:                       - Prior to the procedure, a History and Physical was                        performed, and patient medications and allergies were                        reviewed. The patient's tolerance of previous                        anesthesia was also reviewed. The risks and benefits of                        the procedure and the sedation options and risks were                        discussed with the patient. All questions were                        answered, and informed consent was obtained. Prior                        Anticoagulants: The patient has taken no previous                        anticoagulant or antiplatelet agents. ASA Grade                        Assessment: II - A patient with mild systemic disease.                        After reviewing the risks and benefits, the patient was                        deemed in satisfactory condition to undergo the                        procedure.                       After obtaining informed consent, the colonoscope was  passed under direct vision. Throughout the procedure,                        the patient's blood pressure, pulse, and oxygen                        saturations were monitored continuously. The Olympus   CF-HQ190L Colonoscope (S#. [phone]) was introduced                        through the anus and advanced to the the cecum,                        identified by appendiceal orifice and ileocecal valve.                        The colonoscopy was performed without difficulty. The                        patient tolerated the procedure well. The quality of                        the bowel preparation was excellent. Findings:      The perianal and digital rectal examinations were normal.      Two sessile polyps were found in the ascending colon. The polyps were 4       to 10 mm in size. These polyps were removed with a cold snare. Resection       and retrieval were complete. To prevent bleeding post-intervention, one       hemostatic clip was successfully placed (MR conditional). There was no       bleeding at the end of the procedure.      Three sessile polyps were found in the transverse colon. The polyps were       3 to 4 mm in size. These polyps were removed with a cold biopsy forceps.       Resection and retrieval were complete.      Two sessile polyps were found in the sigmoid colon. The polyps were 3 to       4 mm in size. These polyps were removed with a cold snare. Resection and       retrieval were complete.      Non-bleeding internal hemorrhoids were found during retroflexion. The       hemorrhoids were Grade I (internal hemorrhoids that do not prolapse). Impression:           - Two 4 to 10 mm polyps in the ascending colon, removed                        with a cold snare. Resected and retrieved. Clip (MR                        conditional) was placed.                       - Three 3 to 4 mm polyps in the transverse colon,                        removed with a cold biopsy forceps. Resected and  retrieved.                       - Two 3 to 4 mm polyps in the sigmoid colon, removed                        with a cold snare. Resected and retrieved.                        - Non-bleeding internal hemorrhoids. Recommendation:       - Discharge patient to home.                       - Resume previous diet.                       - Continue present medications.                       - Await pathology results.                       - Repeat colonoscopy in 5 years for surveillance. Procedure Code(s):    --- Professional ---                       [phone], Colonoscopy, flexible; with removal of tumor(s),                        polyp(s), or other lesion(s) by snare technique                       45380, 85, Colonoscopy, flexible; with biopsy, single                        or multiple Diagnosis Code(s):    --- Professional ---                       Z86.010, Personal history of colonic polyps                       D12.2, Benign neoplasm of ascending colon                       D12.3, Benign neoplasm of transverse colon (hepatic                        flexure or splenic flexure)                       D12.5, Benign neoplasm of sigmoid colon CPT copyright 2016 [employer]. All rights reserved. The codes documented in this report are preliminary and upon coder review may  be revised to meet current compliance requirements. Lucilla Lame MD, MD 04/17/2017 8:13:31 AM This report has been signed electronically. Number of Addenda: 0 Note Initiated On: 04/17/2017 7:47 AM Scope Withdrawal Time: 0 hours 14 minutes 12 seconds  Total Procedure Duration: 0 hours 19 minutes 4 seconds       [facility]

## 2017-04-17 NOTE — H&P (Signed)
Lucilla Lame, MD [address] [place], [place] [address] Phone:[phone] Fax : [phone]  Primary Care Physician:  Guadalupe Maple, MD Primary Gastroenterologist:  Dr. Allen Norris  Pre-Procedure History & Physical: HPI:  Neil Bush is a 67 y.o. male is here for an colonoscopy.   Past Medical History:  Diagnosis Date  . Chest pain    a. 01/2016 in setting of Afib;  b. 02/2016 Ex MV: EF 55-65%, no ischemia.  Marland Kitchen Dysrhythmia   . Headache   . Hyperlipidemia   . Hypertension   . PAF (paroxysmal atrial fibrillation) ([place])    a. 01/2016 - admission for rapid AF and c/p-->converted on IV dilt;  b. CHA2DS2VASc = 2-->Xarelto;  c. 01/2016 Echo: EF 55-60%, triv AI.  Marland Kitchen Palpitations    a. 08/2016 Zio Patch: symptomatic PAC's and brief (max 11 beats) of SVT/PAT (3 episodes).    Past Surgical History:  Procedure Laterality Date  . CARDIAC CATHETERIZATION     [facility]   . CHOLECYSTECTOMY    . EYE SURGERY    . FOOT SURGERY    . HERNIA REPAIR     X 2 ingunial  . KNEE SURGERY Right     Prior to Admission medications   Medication Sig Start Date End Date Taking? Authorizing Provider  atorvastatin (LIPITOR) 40 MG tablet Take 1 tablet (40 mg total) by mouth daily at 6 PM. 02/11/17  Yes Crissman, Jeannette How, MD  lisinopril (PRINIVIL,ZESTRIL) 20 MG tablet Take 1 tablet (20 mg total) by mouth daily. 02/05/17  Yes Crissman, Jeannette How, MD  metoprolol succinate (TOPROL-XL) 50 MG 24 hr tablet Take 1 tablet (50 mg total) by mouth daily. Take with or immediately following a meal. 02/05/17 05/06/17 Yes Crissman, Jeannette How, MD  rivaroxaban (XARELTO) 20 MG TABS tablet Take 1 tablet (20 mg total) by mouth daily. 02/05/17   Guadalupe Maple, MD    Allergies as of 02/16/2017 - Review Complete 02/05/2017  Allergen Reaction Noted  . Penicillins Rash 10/09/2014    Family History  Problem Relation Age of Onset  . Cancer Mother   . Heart disease Father   . Diabetes Father   . Hypertension Father   .  Heart disease Brother   . COPD Neg Hx   . Stroke Neg Hx     Social History   Socioeconomic History  . Marital status: Married    Spouse name: Not on file  . Number of children: Not on file  . Years of education: Not on file  . Highest education level: Not on file  Social Needs  . Financial resource strain: Not on file  . Food insecurity - worry: Not on file  . Food insecurity - inability: Not on file  . Transportation needs - medical: Not on file  . Transportation needs - non-medical: Not on file  Occupational History  . Occupation: partime truck Geophysicist/field seismologist  Tobacco Use  . Smoking status: Never Smoker  . Smokeless tobacco: Never Used  Substance and Sexual Activity  . Alcohol use: No    Alcohol/week: 0.0 oz  . Drug use: No  . Sexual activity: Yes  Other Topics Concern  . Not on file  Social History Narrative  . Not on file    Review of Systems: See HPI, otherwise negative ROS  Physical Exam: BP (!) 143/90   Pulse 82   Temp 99.1 F (37.3 C) (Temporal)   Resp 16   Ht 5\' 10"  (1.778 m)  Wt 178 lb (80.7 kg)   SpO2 97%   BMI 25.54 kg/m  General:   Alert,  pleasant and cooperative in NAD Head:  Normocephalic and atraumatic. Neck:  Supple; no masses or thyromegaly. Lungs:  Clear throughout to auscultation.    Heart:  Regular rate and rhythm. Abdomen:  Soft, nontender and nondistended. Normal bowel sounds, without guarding, and without rebound.   Neurologic:  Alert and  oriented x4;  grossly normal neurologically.  Impression/Plan: Neil Bush is here for an colonoscopy to be performed for history of colon polyps  Risks, benefits, limitations, and alternatives regarding  colonoscopy have been reviewed with the patient.  Questions have been answered.  All parties agreeable.   Lucilla Lame, MD  04/17/2017, 7:34 AM

## 2017-04-17 NOTE — Anesthesia Preprocedure Evaluation (Signed)
Anesthesia Evaluation  Patient identified by MRN, date of birth, ID band Patient awake    Reviewed: Allergy & Precautions, NPO status , Patient's Chart, lab work & pertinent test results  History of Anesthesia Complications Negative for: history of anesthetic complications  Airway Mallampati: III  TM Distance: >3 FB Neck ROM: Full    Dental no notable dental hx.    Pulmonary neg pulmonary ROS,    Pulmonary exam normal breath sounds clear to auscultation       Cardiovascular Exercise Tolerance: Good hypertension, Normal cardiovascular exam+ dysrhythmias (paroxysmal atrial fibrillation on metoprolol and xarelto) + Valvular Problems/Murmurs AI  Rhythm:Regular Rate:Normal     Neuro/Psych negative neurological ROS     GI/Hepatic negative GI ROS,   Endo/Other  negative endocrine ROS  Renal/GU negative Renal ROS     Musculoskeletal   Abdominal   Peds  Hematology negative hematology ROS (+)   Anesthesia Other Findings BPH  Reproductive/Obstetrics                             Anesthesia Physical Anesthesia Plan  ASA: III  Anesthesia Plan: General   Post-op Pain Management:    Induction: Intravenous  PONV Risk Score and Plan: 2 and Propofol infusion and TIVA  Airway Management Planned: Natural Airway  Additional Equipment:   Intra-op Plan:   Post-operative Plan:   Informed Consent: I have reviewed the patients History and Physical, chart, labs and discussed the procedure including the risks, benefits and alternatives for the proposed anesthesia with the patient or authorized representative who has indicated his/her understanding and acceptance.     Plan Discussed with: CRNA  Anesthesia Plan Comments:         Anesthesia Quick Evaluation

## 2017-04-17 NOTE — Transfer of Care (Signed)
Immediate Anesthesia Transfer of Care Note  Patient: Neil Bush  Procedure(s) Performed: COLONOSCOPY WITH PROPOFOL (N/A ) POLYPECTOMY INTESTINAL  Patient Location: PACU  Anesthesia Type: General  Level of Consciousness: awake, alert  and patient cooperative  Airway and Oxygen Therapy: Patient Spontanous Breathing and Patient connected to supplemental oxygen  Post-op Assessment: Post-op Vital signs reviewed, Patient's Cardiovascular Status Stable, Respiratory Function Stable, Patent Airway and No signs of Nausea or vomiting  Post-op Vital Signs: Reviewed and stable  Complications: No apparent anesthesia complications

## 2017-04-17 NOTE — Anesthesia Procedure Notes (Signed)
Procedure Name: MAC Date/Time: 04/17/2017 7:45 AM Performed by: Janna Arch, CRNA Pre-anesthesia Checklist: Patient identified, Emergency Drugs available, Suction available and Patient being monitored Patient Re-evaluated:Patient Re-evaluated prior to induction Oxygen Delivery Method: Nasal cannula

## 2017-04-17 NOTE — Anesthesia Postprocedure Evaluation (Signed)
Anesthesia Post Note  Patient: Neil Bush  Procedure(s) Performed: COLONOSCOPY WITH PROPOFOL (N/A ) POLYPECTOMY INTESTINAL  Patient location during evaluation: PACU Anesthesia Type: General Level of consciousness: awake and alert, oriented and patient cooperative Pain management: pain level controlled Vital Signs Assessment: post-procedure vital signs reviewed and stable Respiratory status: spontaneous breathing, nonlabored ventilation and respiratory function stable Cardiovascular status: blood pressure returned to baseline and stable Postop Assessment: adequate PO intake Anesthetic complications: no    Darrin Nipper

## 2017-04-20 ENCOUNTER — Encounter: Payer: Self-pay | Admitting: Gastroenterology

## 2017-04-27 ENCOUNTER — Encounter: Payer: Self-pay | Admitting: Gastroenterology

## 2017-06-11 ENCOUNTER — Ambulatory Visit: Payer: PPO | Admitting: Family Medicine

## 2017-06-11 ENCOUNTER — Encounter: Payer: Self-pay | Admitting: Family Medicine

## 2017-06-11 VITALS — BP 137/80 | HR 63 | Temp 97.9°F | Wt 181.4 lb

## 2017-06-11 DIAGNOSIS — M7050 Other bursitis of knee, unspecified knee: Secondary | ICD-10-CM | POA: Diagnosis not present

## 2017-06-11 MED ORDER — PREDNISONE 50 MG PO TABS: 50.0000 mg | ORAL_TABLET | Freq: Every day | ORAL | 0 refills | Status: DC

## 2017-06-11 NOTE — Progress Notes (Signed)
BP 137/80 (BP Location: Left Arm, Patient Position: Sitting, Cuff Size: Normal)   Pulse 63   Temp 97.9 F (36.6 C)   Wt 181 lb 7 oz (82.3 kg)   SpO2 97%   BMI 26.03 kg/m    Subjective:    Patient ID: Neil Bush, male    DOB: Feb 03, 1951, 67 y.o.   MRN: [id number]  HPI: Neil Bush is a 67 y.o. male  Chief Complaint  Patient presents with  . Knee Pain    left, was xrayed back in August and had an injection in December   KNEE PAIN- had an injection done back in December- not sure if it really helped Duration: 1 month ago Involved knee: left Mechanism of injury: unknown Location:medial Onset: gradual Severity: moderate  Quality:  Aching pain, mainly at night Frequency: coming and going, mainly at night Radiation: no Aggravating factors: Going to bed, laying it, twisting it   Alleviating factors: tylenol   Status: worse Treatments attempted: rest, ice, heat and APAP  Relief with NSAIDs?:  No NSAIDs Taken Weakness with weight bearing or walking: no Sensation of giving way: no Locking: no Popping: no Bruising: no Swelling: no Redness: no Paresthesias/decreased sensation: no Fevers: no  Relevant past medical, surgical, family and social history reviewed and updated as indicated. Interim medical history since our last visit reviewed. Allergies and medications reviewed and updated.  Review of Systems  Constitutional: Negative.   Respiratory: Negative.   Cardiovascular: Negative.   Musculoskeletal: Positive for arthralgias. Negative for back pain, gait problem, joint swelling, myalgias, neck pain and neck stiffness.  Skin: Negative.   Neurological: Negative.   Psychiatric/Behavioral: Negative.     Per HPI unless specifically indicated above     Objective:    BP 137/80 (BP Location: Left Arm, Patient Position: Sitting, Cuff Size: Normal)   Pulse 63   Temp 97.9 F (36.6 C)   Wt 181 lb 7 oz (82.3 kg)   SpO2 97%   BMI 26.03 kg/m   Wt Readings from Last  3 Encounters:  06/11/17 181 lb 7 oz (82.3 kg)  04/17/17 178 lb (80.7 kg)  04/09/17 183 lb (83 kg)    Physical Exam  Constitutional: He is oriented to person, place, and time. He appears well-developed and well-nourished. No distress.  HENT:  Head: Normocephalic and atraumatic.  Right Ear: Hearing normal.  Left Ear: Hearing normal.  Nose: Nose normal.  Eyes: Conjunctivae and lids are normal. Right eye exhibits no discharge. Left eye exhibits no discharge. No scleral icterus.  Cardiovascular: Normal rate, regular rhythm, normal heart sounds and intact distal pulses. Exam reveals no gallop and no friction rub.  No murmur heard. Pulmonary/Chest: Effort normal and breath sounds normal. No stridor. No respiratory distress. He has no wheezes. He has no rales. He exhibits no tenderness.  Musculoskeletal: Normal range of motion. He exhibits tenderness. He exhibits no edema or deformity.  Negative anterior and posterior drawer test, negative lachmans, negative appley's compression and distraction, tenderness at L knee about 1 inch inferior to greater condyle medially. No tenderness along joint line  Neurological: He is alert and oriented to person, place, and time.  Skin: Skin is warm, dry and intact. Capillary refill takes less than 2 seconds. No rash noted. He is not diaphoretic. No erythema. No pallor.  Psychiatric: He has a normal mood and affect. His speech is normal and behavior is normal. Judgment and thought content normal. Cognition and memory are normal.  Nursing note  and vitals reviewed.   Results for orders placed or performed in visit on 04/09/17  Hepatitis C Antibody  Result Value Ref Range   Hep C Virus Ab <0.1 0.0 - 0.9 s/co ratio  Basic metabolic panel  Result Value Ref Range   Glucose 108 (H) 65 - 99 mg/dL   BUN 19 8 - 27 mg/dL   Creatinine, Ser 1.24 0.76 - 1.27 mg/dL   GFR calc non Af Amer 59 (L) >59 mL/min/1.73   GFR calc Af Amer 69 >59 mL/min/1.73   BUN/Creatinine Ratio  15 10 - 24   Sodium 142 134 - 144 mmol/L   Potassium 4.3 3.5 - 5.2 mmol/L   Chloride 106 96 - 106 mmol/L   CO2 21 20 - 29 mmol/L   Calcium 9.1 8.6 - 10.2 mg/dL  LP+ALT+AST Piccolo, Waived  Result Value Ref Range   ALT (SGPT) Piccolo, Waived 27 10 - 47 U/L   AST (SGOT) Piccolo, Waived 19 11 - 38 U/L   Cholesterol Piccolo, Waived 144 <200 mg/dL   HDL Chol Piccolo, Waived 45 (L) >59 mg/dL   Triglycerides Piccolo,Waived 141 <150 mg/dL   Chol/HDL Ratio Piccolo,Waive 3.2 mg/dL   LDL Chol Calc Piccolo Waived 70 <100 mg/dL   VLDL Chol Calc Piccolo,Waive 28 <30 mg/dL      Assessment & Plan:   Problem List Items Addressed This Visit    None    Visit Diagnoses    Pes anserine bursitis    -  Primary   No one in this office does pes anserine bursa injections. Will treat with exercises and burst of prednisone and get him into ortho. Referral generated today.   Relevant Medications   predniSONE (DELTASONE) 50 MG tablet   Other Relevant Orders   Ambulatory referral to Orthopedic Surgery       Follow up plan: Return if symptoms worsen or fail to improve.

## 2017-06-11 NOTE — Patient Instructions (Signed)
Pes Anserine Bursitis Rehab Ask your health care provider which exercises are safe for you. Do exercises exactly as told by your health care provider and adjust them as directed. It is normal to feel mild stretching, pulling, tightness, or discomfort as you do these exercises, but you should stop right away if you feel sudden pain or your pain gets worse.Do not begin these exercises until told by your health care provider. Stretching and range of motion exercises These exercises warm up your muscles and joints and improve the movement and flexibility of your knee. These exercises also help to relieve pain and stiffness. Exercise A: Hamstring, doorway  1. Lie on your back in front of a doorway with your left / right leg resting against the wall and your other leg flat on the floor in the doorway. There should be a slight bend in your left / right knee. 2. Straighten your left / right knee. You should feel a stretch behind your knee or thigh. If you do not, scoot your buttocks closer to the door. 3. Hold this position for __________ seconds. Repeat __________ times. Complete this stretch __________ times a day. Exercise B: V-sit ( hamstrings and adductors) 1. Sit on the floor with your legs extended in a large "V" shape. Keep your knees straight during this exercise. 2. Keeping your head and back in a straight line, bend at your waist to reach for your left foot (position A). You should feel a stretch in your right inner thigh. 3. Hold for __________ seconds. Then slowly return to the upright position. 4. Keeping your head and back in a straight line, bend at your waist to reach forward (position B). You should feel a stretch behind both of your thighs or knees. 5. Hold for __________ seconds. Then slowly return to the upright position. 6. Keeping your head and back in a straight line, bend at your waist to reach for your right foot (position C). You should feel a stretch in your left inner  thigh. 7. Hold for __________ seconds. Then slowly return to the upright position. Repeat __________ times. Complete this exercise __________ times a day. Exercise C: Quadriceps, prone  1. Lie on your abdomen on a firm surface, such as a bed or padded floor. 2. Bend your left / right knee and hold your ankle. If you cannot reach your ankle or pant leg, loop a belt around your foot and grab the belt instead. 3. Gently pull your heel toward your buttocks. Your knee should not slide out to the side. You should feel a stretch in the front of your thigh and knee. 4. Hold this position for __________ seconds. Repeat __________ times. Complete this stretch __________ times a day. Strengthening exercises These exercises build strength and endurance in your knee and hip. Endurance is the ability to use your muscles for a long time, even after they get tired. Exercise D: Terminal knee extension, quadriceps 1. Secure a long loop of rubber exercise band around a sturdy object like a table leg. 2. Put the band behind your left / right knee. Step back from where the band is secured to put tension on the band. 3. Slowly bend your left / right knee. Keep your left / right foot flat on the floor. 4. Tighten the muscle in the front of your thigh and push back against the band to straighten your knee. 5. Hold this position for __________ seconds. 6. Return to having your left / right knee bent. Repeat   __________ times. Complete this stretch __________ times a day. Exercise E: Straight leg raises ( hip abductors) 1. Lie on your side with your left / right leg in the top position. Your head, shoulder, knee, and hip should line up. You may bend your bottom knee to help you balance. 2. Lift your top leg 4-6 inches (10-15 cm) while keeping your toes pointed straight ahead. 3. Hold this position for __________ seconds. 4. Slowly lower your leg to the starting position. 5. Allow your muscles to relax completely  after each repetition. Repeat __________ times. Complete this exercise __________ times a day. This information is not intended to replace advice given to you by your health care provider. Make sure you discuss any questions you have with your health care provider. Document Released: 02/17/2005 Document Revised: 10/23/2015 Document Reviewed: 02/02/2015 Elsevier Interactive Patient Education  2018 [employer]  Pes Anserine Bursitis The pes anserine is an area on the inside of your knee, just below the joint, which is cushioned by a fluid-filled sac (bursa). Pes anserine bursitis is a condition that happens when this bursa gets swollen and irritated. The condition causes knee pain. What are the causes? This condition may be caused by:  Making the same movement over and over.  A hit to the inside of the leg.  What increases the risk? This injury is most likely to develop in:  Runners.  Athletes who play sports that involve a lot of running and quick side-to-side movements (cutting).  Athletes who play contact sports.  People who swim using an inward angle of the knee, such as with the breaststroke.  People with tight hamstring muscles.  Females.  People who are overweight.  People with flat feet.  People who have diabetes or osteoarthritis.  What are the signs or symptoms? Symptoms of this condition include:  Knee pain that gets better with rest and worse with activities like climbing stairs, walking, running, or getting in and out of a chair (common).  Swelling.  Warmth.  Tenderness when pressing at the inside of the lower leg, just below the knee.  How is this diagnosed? This condition may be diagnosed based on:  Your symptoms.  Your medical history.  A physical exam.  Tests, such as: ? X-rays. ? MRI and ultrasound. These tests are done to check for swelling and fluid buildup in the bursa and to look at muscles and tendons.  During your physical exam,  your health care provider will press on the tendon attachment to see if you feel pain. He or she may also check your hip and knee motion and strength. How is this treated? This condition may be treated by:  Resting your knee.  Avoiding activities that cause pain.  Icing the inside of your knee.  Raising (elevating) your knee while resting.  Sleeping with a pillow between your knees. This will cushion your injured knee.  Taking medicine to reduce pain and swelling.  Having medicines injected into your knee.  Doing strengthening and stretching exercises (physical therapy).  If these treatments do not work or if the condition keeps coming back, you may need to have surgery to remove the bursa. Follow these instructions at home: Managing pain, stiffness, and swelling  If directed, apply ice to your knee. ? Put ice in a plastic bag. ? Place a towel between your skin and the bag. ? Leave the ice on for 20 minutes, 2-3 times a day.  While you are sitting, elevate your knee.    While you are lying down, elevate your knee above the level of your heart.  Take over-the-counter and prescription medicines only as told by your health care provider. Activity  Return to your normal activities as told by your health care provider. Ask your health care provider what activities are safe for you.  Do exercises as told by your health care provider. General instructions  Sleep with a pillow between your knees.  Do not use any tobacco products, such as cigarettes, chewing tobacco, and e-cigarettes. Tobacco can delay healing. If you need help quitting, ask your health care provider.  Keep all follow-up visits as told by your health care provider. This is important. How is this prevented?  Warm up and stretch before being active.  Cool down and stretch after being active.  Give your body time to rest between periods of activity.  Make sure to use equipment that fits you.  Be safe and  responsible while being active to avoid falls.  Do at least 150 minutes of moderate-intensity exercise each week, such as brisk walking or water aerobics.  Maintain physical fitness, including: ? Strength. ? Flexibility. ? Cardiovascular fitness. ? Endurance. Contact a health care provider if:  Your symptoms do not improve.  Your symptoms get worse. This information is not intended to replace advice given to you by your health care provider. Make sure you discuss any questions you have with your health care provider. Document Released: 02/17/2005 Document Revised: 10/23/2015 Document Reviewed: 02/02/2015 Elsevier Interactive Patient Education  2018 [employer]vier Inc.  

## 2017-06-15 DIAGNOSIS — M1712 Unilateral primary osteoarthritis, left knee: Secondary | ICD-10-CM | POA: Diagnosis not present

## 2017-08-05 ENCOUNTER — Telehealth: Payer: Self-pay | Admitting: Cardiovascular Disease

## 2017-08-05 NOTE — Telephone Encounter (Signed)
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

## 2017-08-05 NOTE — Telephone Encounter (Signed)
Forms placed on Pam's desk for Dr. Rockey Situ to review.

## 2017-08-06 ENCOUNTER — Telehealth: Payer: Self-pay | Admitting: Family Medicine

## 2017-08-06 NOTE — Telephone Encounter (Signed)
Office visit note printed, testing listed, and forms placed on Dr. Donivan Scull desk for his review.

## 2017-08-06 NOTE — Telephone Encounter (Signed)
Copied from [place] [id number]. 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 Pt states he is taking 10mg  lisinopril (said that is what is on his bottle and last filled 07/08/17 written by Dr. Rockey Situ). Pt states sometimes he pours them from one bottle to the other. Per medication list pt was ordered 20mg  lisinopril 01/2017 . Pt asking if he should be on 10mg  or 20mg  or if medication needs to be changed. He couldn't read the # of the medication but said it is orange and oblong. He needs to do something to continue driving. Please call pt to advise.  [place], [place] - [place] [phone] (Phone) [phone] (Fax)

## 2017-08-06 NOTE — Telephone Encounter (Signed)
Patient can take lisinopril 20 mg a day

## 2017-08-07 NOTE — Telephone Encounter (Signed)
Message relayed to patient. Verbalized understanding and denied questions.   

## 2017-08-12 ENCOUNTER — Ambulatory Visit (INDEPENDENT_AMBULATORY_CARE_PROVIDER_SITE_OTHER): Payer: Self-pay

## 2017-08-12 VITALS — BP 132/73

## 2017-08-12 DIAGNOSIS — Z0130 Encounter for examination of blood pressure without abnormal findings: Secondary | ICD-10-CM

## 2017-08-12 DIAGNOSIS — I10 Essential (primary) hypertension: Secondary | ICD-10-CM

## 2017-08-12 NOTE — Progress Notes (Signed)
Blood pressure check for patient to complete his work assessment. Blood pressure check authorized by Dr. Jeananne Rama.  Reading: 132/73 reading, left arm, sitting.  Dr. Jeananne Rama signed form and filled out blood pressure section. Form given back to patient.

## 2017-08-14 NOTE — Telephone Encounter (Signed)
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 Form is in my "Save" bin above my desk. He is going to come by Monday to pick up this form. He was appreciative for the call back with no further questions at this time.

## 2017-08-14 NOTE — Telephone Encounter (Signed)
Patient calling to check status of forms to be faxed.  Please call when this is complete

## 2017-08-17 NOTE — Telephone Encounter (Signed)
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 Rockey Situ. Copy made and he was provided with the original.

## 2017-08-20 ENCOUNTER — Encounter: Payer: Self-pay | Admitting: Family Medicine

## 2017-09-17 DIAGNOSIS — M25562 Pain in left knee: Secondary | ICD-10-CM | POA: Diagnosis not present

## 2017-09-28 DIAGNOSIS — M545 Low back pain: Secondary | ICD-10-CM | POA: Diagnosis not present

## 2017-09-28 DIAGNOSIS — M25562 Pain in left knee: Secondary | ICD-10-CM | POA: Diagnosis not present

## 2017-10-01 DIAGNOSIS — S83207A Unspecified tear of unspecified meniscus, current injury, left knee, initial encounter: Secondary | ICD-10-CM | POA: Diagnosis not present

## 2017-10-02 ENCOUNTER — Telehealth: Payer: Self-pay | Admitting: Cardiovascular Disease

## 2017-10-02 NOTE — Telephone Encounter (Signed)
° °  [facility] Pre-operative Risk Assessment    Request for surgical clearance:  1. What type of surgery is being performed? Left Knee orthoscopy   2. When is this surgery scheduled? 10/16/17  3. What type of clearance is required (medical clearance vs. Pharmacy clearance to hold med vs. Both)? Now listed   4. Are there any medications that need to be held prior to surgery and how long? Not listed   5. Practice name and name of physician performing surgery? [facility] Dr Kurtis Bushman    6. What is your office phone number [phone]    7.   What is your office fax number [phone]  8.   Anesthesia type (None, local, MAC, general) ? Choice

## 2017-10-05 NOTE — Telephone Encounter (Signed)
Routed to number provided via EPIC fax.  

## 2017-10-05 NOTE — Telephone Encounter (Signed)
Acceptable risk for surgery Would hold anticoagulation 2 days prior to surgery 3 acceptable if needed by surgeon Restart when surgeon permits

## 2017-10-06 ENCOUNTER — Ambulatory Visit: Payer: PPO | Admitting: Family Medicine

## 2017-10-06 ENCOUNTER — Encounter: Payer: Self-pay | Admitting: Family Medicine

## 2017-10-06 VITALS — BP 132/77 | HR 83 | Wt 184.0 lb

## 2017-10-06 DIAGNOSIS — E782 Mixed hyperlipidemia: Secondary | ICD-10-CM | POA: Diagnosis not present

## 2017-10-06 DIAGNOSIS — N400 Enlarged prostate without lower urinary tract symptoms: Secondary | ICD-10-CM

## 2017-10-06 DIAGNOSIS — Z01818 Encounter for other preprocedural examination: Secondary | ICD-10-CM

## 2017-10-06 DIAGNOSIS — I10 Essential (primary) hypertension: Secondary | ICD-10-CM | POA: Diagnosis not present

## 2017-10-06 DIAGNOSIS — E7800 Pure hypercholesterolemia, unspecified: Secondary | ICD-10-CM

## 2017-10-06 DIAGNOSIS — I480 Paroxysmal atrial fibrillation: Secondary | ICD-10-CM

## 2017-10-06 LAB — UA/M W/RFLX CULTURE, ROUTINE
Bilirubin, UA: NEGATIVE
Glucose, UA: NEGATIVE
KETONES UA: NEGATIVE
Leukocytes, UA: NEGATIVE
Nitrite, UA: NEGATIVE
PH UA: 5 (ref 5.0–7.5)
PROTEIN UA: NEGATIVE
UUROB: 1 mg/dL (ref 0.2–1.0)

## 2017-10-06 LAB — LP+ALT+AST PICCOLO, WAIVED
ALT (SGPT) Piccolo, Waived: 23 U/L (ref 10–47)
AST (SGOT) PICCOLO, WAIVED: 28 U/L (ref 11–38)
CHOL/HDL RATIO PICCOLO,WAIVE: 3.2 mg/dL
CHOLESTEROL PICCOLO, WAIVED: 137 mg/dL (ref <200)
HDL CHOL PICCOLO, WAIVED: 43 mg/dL — AB (ref >59)
LDL CHOL CALC PICCOLO WAIVED: 40 mg/dL (ref <100)
TRIGLYCERIDES PICCOLO,WAIVED: 269 mg/dL — AB (ref <150)
VLDL Chol Calc Piccolo,Waive: 54 mg/dL — ABNORMAL HIGH (ref <30)

## 2017-10-06 LAB — MICROSCOPIC EXAMINATION: BACTERIA UA: NONE SEEN

## 2017-10-06 LAB — MICROALBUMIN, URINE WAIVED
Creatinine, Urine Waived: 300 mg/dL (ref 10–300)
Microalb, Ur Waived: 30 mg/L — ABNORMAL HIGH (ref 0–19)

## 2017-10-06 LAB — BAYER DCA HB A1C WAIVED: HB A1C (BAYER DCA - WAIVED): 5 % (ref <7.0)

## 2017-10-06 NOTE — Assessment & Plan Note (Signed)
The current medical regimen is effective;  continue present plan and medications.  

## 2017-10-06 NOTE — Assessment & Plan Note (Signed)
The current medical regimen is effective;  continue present plan and medications. Surgeon wants Xarelto stopped 5 days prior to surgery patient will do so

## 2017-10-06 NOTE — Progress Notes (Signed)
BP 132/77   Pulse 83   Wt 184 lb (83.5 kg)   SpO2 95%   BMI 26.40 kg/m    Subjective:    Patient ID: Neil Bush, male    DOB: 1951/01/11, 67 y.o.   MRN: [id number]  HPI: Neil Bush is a 67 y.o. male  Preoperative clearance for left knee surgery torn meniscus. No complaints or concerns taking blood pressure medication without problems Taking Xarelto 20 mg a day without problems or issues has been instructed to stop 5 days prior to surgery which patient will do. Not taking tramadol but has a prescription Relevant past medical, surgical, family and social history reviewed and updated as indicated. Interim medical history since our last visit reviewed. Allergies and medications reviewed and updated.  Review of Systems  Constitutional: Negative.   HENT: Negative.   Eyes: Negative.   Respiratory: Negative.   Cardiovascular: Negative.   Gastrointestinal: Negative.   Endocrine: Negative.   Genitourinary: Negative.   Musculoskeletal: Negative.   Skin: Negative.   Allergic/Immunologic: Negative.   Neurological: Negative.   Hematological: Negative.   Psychiatric/Behavioral: Negative.     Per HPI unless specifically indicated above     Objective:    BP 132/77   Pulse 83   Wt 184 lb (83.5 kg)   SpO2 95%   BMI 26.40 kg/m   Wt Readings from Last 3 Encounters:  10/06/17 184 lb (83.5 kg)  06/11/17 181 lb 7 oz (82.3 kg)  04/17/17 178 lb (80.7 kg)    Physical Exam  Constitutional: He is oriented to person, place, and time. He appears well-developed and well-nourished.  HENT:  Head: Normocephalic.  Right Ear: External ear normal.  Left Ear: External ear normal.  Nose: Nose normal.  Eyes: Pupils are equal, round, and reactive to light. Conjunctivae and EOM are normal.  Neck: Normal range of motion. Neck supple. No thyromegaly present.  Cardiovascular: Normal rate, regular rhythm, normal heart sounds and intact distal pulses.  Pulmonary/Chest: Effort normal and  breath sounds normal.  Abdominal: Soft. Bowel sounds are normal. There is no splenomegaly or hepatomegaly.  Genitourinary: Penis normal.  Musculoskeletal: Normal range of motion.  Lymphadenopathy:    He has no cervical adenopathy.  Neurological: He is alert and oriented to person, place, and time. He has normal reflexes.  Skin: Skin is warm and dry.  Psychiatric: He has a normal mood and affect. His behavior is normal. Judgment and thought content normal.   EKG normal with normal sinus rhythm and normal EKG Results for orders placed or performed in visit on 04/09/17  Hepatitis C Antibody  Result Value Ref Range   Hep C Virus Ab <0.1 0.0 - 0.9 s/co ratio  Basic metabolic panel  Result Value Ref Range   Glucose 108 (H) 65 - 99 mg/dL   BUN 19 8 - 27 mg/dL   Creatinine, Ser 1.24 0.76 - 1.27 mg/dL   GFR calc non Af Amer 59 (L) >59 mL/min/1.73   GFR calc Af Amer 69 >59 mL/min/1.73   BUN/Creatinine Ratio 15 10 - 24   Sodium 142 134 - 144 mmol/L   Potassium 4.3 3.5 - 5.2 mmol/L   Chloride 106 96 - 106 mmol/L   CO2 21 20 - 29 mmol/L   Calcium 9.1 8.6 - 10.2 mg/dL  LP+ALT+AST Piccolo, Waived  Result Value Ref Range   ALT (SGPT) Piccolo, Waived 27 10 - 47 U/L   AST (SGOT) Piccolo, Waived 19 11 - 38 U/L  Cholesterol Piccolo, Waived 144 <200 mg/dL   HDL Chol Piccolo, Waived 45 (L) >59 mg/dL   Triglycerides Piccolo,Waived 141 <150 mg/dL   Chol/HDL Ratio Piccolo,Waive 3.2 mg/dL   LDL Chol Calc Piccolo Waived 70 <100 mg/dL   VLDL Chol Calc Piccolo,Waive 28 <30 mg/dL      Assessment & Plan:   Problem List Items Addressed This Visit      Cardiovascular and Mediastinum   Essential hypertension - Primary    The current medical regimen is effective;  continue present plan and medications.       Relevant Medications   lisinopril (PRINIVIL,ZESTRIL) 10 MG tablet   Paroxysmal atrial fibrillation (HCC)    The current medical regimen is effective;  continue present plan and  medications. Surgeon wants Xarelto stopped 5 days prior to surgery patient will do so      Relevant Medications   lisinopril (PRINIVIL,ZESTRIL) 10 MG tablet     Genitourinary   BPH (benign prostatic hyperplasia)     Other   Hyperlipidemia    The current medical regimen is effective;  continue present plan and medications.       Relevant Medications   lisinopril (PRINIVIL,ZESTRIL) 10 MG tablet   Other Relevant Orders   LP+ALT+AST Piccolo, Waived    Other Visit Diagnoses    Preoperative clearance       Relevant Medications   traMADol (ULTRAM) 50 MG tablet   Other Relevant Orders   EKG 12-Lead (Completed)   CBC   Basic metabolic panel   Microalbumin, Urine Waived   Bayer DCA Hb A1c Waived   UA/M w/rflx Culture, Routine    Patient cleared for surgery considered low risk. Labs are pending at the time of this dictation.  Follow up plan: Return in about 6 months (around 04/08/2018) for Physical Exam.

## 2017-10-07 ENCOUNTER — Encounter: Payer: Self-pay | Admitting: Family Medicine

## 2017-10-07 LAB — BASIC METABOLIC PANEL
BUN/Creatinine Ratio: 16 (ref 10–24)
BUN: 18 mg/dL (ref 8–27)
CO2: 23 mmol/L (ref 20–29)
Calcium: 8.9 mg/dL (ref 8.6–10.2)
Chloride: 101 mmol/L (ref 96–106)
Creatinine, Ser: 1.16 mg/dL (ref 0.76–1.27)
GFR calc Af Amer: 74 mL/min/{1.73_m2} (ref >59)
GFR calc non Af Amer: 64 mL/min/{1.73_m2} (ref >59)
GLUCOSE: 126 mg/dL — AB (ref 65–99)
POTASSIUM: 4.2 mmol/L (ref 3.5–5.2)
SODIUM: 141 mmol/L (ref 134–144)

## 2017-10-07 LAB — CBC
Hematocrit: 47.3 % (ref 37.5–51.0)
Hemoglobin: 15.8 g/dL (ref 13.0–17.7)
MCH: 32.4 pg (ref 26.6–33.0)
MCHC: 33.4 g/dL (ref 31.5–35.7)
MCV: 97 fL (ref 79–97)
PLATELETS: 245 10*3/uL (ref 150–450)
RBC: 4.88 x10E6/uL (ref 4.14–5.80)
RDW: 14.2 % (ref 12.3–15.4)
WBC: 7.4 10*3/uL (ref 3.4–10.8)

## 2017-10-08 DIAGNOSIS — Z01818 Encounter for other preprocedural examination: Secondary | ICD-10-CM | POA: Diagnosis not present

## 2017-10-08 DIAGNOSIS — S83232A Complex tear of medial meniscus, current injury, left knee, initial encounter: Secondary | ICD-10-CM | POA: Diagnosis not present

## 2017-10-15 ENCOUNTER — Ambulatory Visit: Payer: PPO | Admitting: Family Medicine

## 2017-10-16 DIAGNOSIS — M65862 Other synovitis and tenosynovitis, left lower leg: Secondary | ICD-10-CM | POA: Diagnosis not present

## 2017-10-16 DIAGNOSIS — I10 Essential (primary) hypertension: Secondary | ICD-10-CM | POA: Diagnosis not present

## 2017-10-16 DIAGNOSIS — S83232A Complex tear of medial meniscus, current injury, left knee, initial encounter: Secondary | ICD-10-CM | POA: Diagnosis not present

## 2017-10-16 DIAGNOSIS — M23352 Other meniscus derangements, posterior horn of lateral meniscus, left knee: Secondary | ICD-10-CM | POA: Diagnosis not present

## 2017-10-16 DIAGNOSIS — E7800 Pure hypercholesterolemia, unspecified: Secondary | ICD-10-CM | POA: Diagnosis not present

## 2017-10-16 DIAGNOSIS — S83282A Other tear of lateral meniscus, current injury, left knee, initial encounter: Secondary | ICD-10-CM | POA: Diagnosis not present

## 2017-10-16 DIAGNOSIS — M94262 Chondromalacia, left knee: Secondary | ICD-10-CM | POA: Diagnosis not present

## 2017-10-16 DIAGNOSIS — M6588 Other synovitis and tenosynovitis, other site: Secondary | ICD-10-CM | POA: Diagnosis not present

## 2017-10-22 DIAGNOSIS — M25562 Pain in left knee: Secondary | ICD-10-CM | POA: Diagnosis not present

## 2017-10-22 DIAGNOSIS — M25662 Stiffness of left knee, not elsewhere classified: Secondary | ICD-10-CM | POA: Diagnosis not present

## 2017-10-22 DIAGNOSIS — R600 Localized edema: Secondary | ICD-10-CM | POA: Diagnosis not present

## 2017-10-27 ENCOUNTER — Ambulatory Visit: Payer: PPO | Admitting: Family Medicine

## 2017-10-29 DIAGNOSIS — M25562 Pain in left knee: Secondary | ICD-10-CM | POA: Diagnosis not present

## 2017-10-29 DIAGNOSIS — M25662 Stiffness of left knee, not elsewhere classified: Secondary | ICD-10-CM | POA: Diagnosis not present

## 2017-10-29 DIAGNOSIS — R600 Localized edema: Secondary | ICD-10-CM | POA: Diagnosis not present

## 2017-12-29 ENCOUNTER — Encounter: Payer: Self-pay | Admitting: Family Medicine

## 2018-01-26 DIAGNOSIS — Z9889 Other specified postprocedural states: Secondary | ICD-10-CM | POA: Diagnosis not present

## 2018-01-26 DIAGNOSIS — M1712 Unilateral primary osteoarthritis, left knee: Secondary | ICD-10-CM | POA: Diagnosis not present

## 2018-02-04 DIAGNOSIS — Z9889 Other specified postprocedural states: Secondary | ICD-10-CM | POA: Diagnosis not present

## 2018-02-04 DIAGNOSIS — M1712 Unilateral primary osteoarthritis, left knee: Secondary | ICD-10-CM | POA: Diagnosis not present

## 2018-02-11 DIAGNOSIS — Z9889 Other specified postprocedural states: Secondary | ICD-10-CM | POA: Diagnosis not present

## 2018-02-11 DIAGNOSIS — M1712 Unilateral primary osteoarthritis, left knee: Secondary | ICD-10-CM | POA: Diagnosis not present

## 2018-02-18 DIAGNOSIS — Z9889 Other specified postprocedural states: Secondary | ICD-10-CM | POA: Diagnosis not present

## 2018-02-18 DIAGNOSIS — M1712 Unilateral primary osteoarthritis, left knee: Secondary | ICD-10-CM | POA: Diagnosis not present

## 2018-02-21 NOTE — Progress Notes (Signed)
Cardiology Office Note  Date:  02/22/2018   ID:  TERRIE GRAJALES, DOB [birth year], MRN [id number]  PCP:  Guadalupe Maple, MD   Chief Complaint  Patient presents with  . other    12 mo  f/u. Medications reviewed verbally.     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 fib and converted to sinus rhythm on IV diltiazem.  Started on Xarelto in the setting of a CHA2DS2VASc of 2.   stress testing which did not show ischemia.  December 2017 Echo showed normal LV function.   event monitor which showed symptomatic PACs and 3 short runs of SVT/atrial tachycardia with a maximum of 11 beats.  He did not have any atrial fibrillation on this monitor.     PMH:   has a past medical history of Chest pain, Dysrhythmia, Headache, Hyperlipidemia, Hypertension, PAF (paroxysmal atrial fibrillation) ([place]), and Palpitations.  PSH:    Past Surgical History:  Procedure Laterality Date  . CARDIAC CATHETERIZATION     [facility]   . CHOLECYSTECTOMY    . COLONOSCOPY WITH PROPOFOL N/A 04/17/2017   Procedure: COLONOSCOPY WITH PROPOFOL;  Surgeon: Lucilla Lame, MD;  Location: [place];  Service: Endoscopy;  Laterality: N/A;  . EYE SURGERY    . FOOT SURGERY    . HERNIA REPAIR     X 2 ingunial   . KNEE SURGERY Right   . POLYPECTOMY  04/17/2017   Procedure: POLYPECTOMY INTESTINAL;  Surgeon: Lucilla Lame, MD;  Location: [place];  Service: Endoscopy;;    Current Outpatient Medications  Medication Sig Dispense Refill  . atorvastatin (LIPITOR) 40 MG tablet Take 1 tablet (40 mg total) by mouth daily at 6 PM. 90 tablet 4  . lisinopril (PRINIVIL,ZESTRIL) 10 MG tablet     . lisinopril (PRINIVIL,ZESTRIL) 20 MG tablet Take 1 tablet (20 mg total) by mouth daily. 90 tablet 4  . metoprolol succinate (TOPROL-XL) 50 MG 24 hr tablet Take 1 tablet (50 mg total) by mouth daily. Take with or immediately following a meal. 90 tablet 4  . rivaroxaban (XARELTO) 20 MG TABS tablet Take 1 tablet (20 mg total) by mouth daily. 90 tablet 4   No current facility-administered medications for this visit.      Allergies:   Penicillins   Social History:  The patient  reports that he has never smoked. He has never used smokeless tobacco. He reports that he does not drink alcohol or use drugs.   Family History:   family history includes Cancer in his mother; Diabetes in his father; Heart disease in his brother and father; Hypertension in his father.    Review of Systems: Review of Systems  Constitutional: Negative.   Respiratory: Negative.   Cardiovascular: Positive for palpitations.  Gastrointestinal: Negative.   Musculoskeletal:  Negative.   Neurological: Negative.   Psychiatric/Behavioral: Negative.   All other systems reviewed and are negative.    PHYSICAL EXAM: VS:  BP 140/64 (BP Location: Left Arm, Patient Position: Sitting, Cuff Size: Normal)   Pulse 70   Ht 5\' 10"  (1.778 m)   Wt 184 lb (83.5 kg)   BMI 26.40 kg/m  , BMI Body mass index is 26.4 kg/m. Constitutional:  oriented to person, place, and time. No distress.  HENT:  Head: Grossly normal Eyes:  no discharge. No scleral icterus.  Neck: No JVD, no carotid bruits  Cardiovascular: Regular rate and rhythm, no murmurs  appreciated Pulmonary/Chest: Clear to auscultation bilaterally, no wheezes or rails Abdominal: Soft.  no distension.  no tenderness.  Musculoskeletal: Normal range of motion Neurological:  normal muscle tone. Coordination normal. No atrophy Skin: Skin warm and dry Psychiatric: normal affect, pleasant   Recent Labs: 10/06/2017: ALT (SGPT) Piccolo, Waived 23; BUN 18; Creatinine, Ser 1.16; Hemoglobin 15.8; Platelets 245; Potassium 4.2; Sodium 141    Lipid Panel Lab Results  Component Value Date   CHOL 137 10/06/2017   HDL 40 02/05/2017   LDLCALC 143 (H) 02/05/2017   TRIG 269 (H) 10/06/2017      Wt Readings from Last 3 Encounters:  02/22/18 184 lb (83.5 kg)  10/06/17 184 lb (83.5 kg)  06/11/17 181 lb 7 oz (82.3 kg)       ASSESSMENT AND PLAN:  Paroxysmal atrial fibrillation (HCC) - Continue Xarelto 20 mg daily,  metoprolol 50 mg p.o. Daily Denies significant tachycardia concerning for atrial fibrillation  Essential hypertension Blood pressure mildly elevated today somewhat improved on recheck Recommended he monitor blood pressure at home He has both lisinopril 10 and lisinopril 20 on his medication list He does not know the dose he is taking at home but thinks it might be 10 daily He will go home and check the dosing and call us back so we can adjust the medication list in our system  Mixed hyperlipidemia On Lipitor LDL at goal less than 70  Chest pain, unspecified type No further chest pain Previous stress test with no ischemia  Disposition:   F/U  12 months   Total encounter time more than 15 minutes  Greater than 50% was spent in counseling and coordination of care with the patient    Orders Placed This Encounter  Procedures  . EKG 12-Lead     Signed, Esmond Plants, M.D., Ph.D. 02/22/2018  [place], [place]

## 2018-02-22 ENCOUNTER — Ambulatory Visit (INDEPENDENT_AMBULATORY_CARE_PROVIDER_SITE_OTHER): Payer: PPO | Admitting: Cardiovascular Disease

## 2018-02-22 ENCOUNTER — Telehealth: Payer: Self-pay | Admitting: Cardiovascular Disease

## 2018-02-22 ENCOUNTER — Encounter: Payer: Self-pay | Admitting: Cardiovascular Disease

## 2018-02-22 VITALS — BP 140/64 | HR 70 | Ht 70.0 in | Wt 184.0 lb

## 2018-02-22 DIAGNOSIS — R079 Chest pain, unspecified: Secondary | ICD-10-CM | POA: Diagnosis not present

## 2018-02-22 DIAGNOSIS — E782 Mixed hyperlipidemia: Secondary | ICD-10-CM

## 2018-02-22 DIAGNOSIS — I10 Essential (primary) hypertension: Secondary | ICD-10-CM | POA: Diagnosis not present

## 2018-02-22 DIAGNOSIS — I480 Paroxysmal atrial fibrillation: Secondary | ICD-10-CM | POA: Diagnosis not present

## 2018-02-22 NOTE — Patient Instructions (Addendum)
Medication Instructions:  No changes  If you need a refill on your cardiac medications before your next appointment, please call your pharmacy.   Samples given: Xarelto 20 mg Lot: [id number] Exp: 6/21 #2 bottles  Lab work: No new labs needed   If you have labs (blood work) drawn today and your tests are completely normal, you will receive your results only by: Marland Kitchen MyChart Message (if you have MyChart) OR . A paper copy in the mail If you have any lab test that is abnormal or we need to change your treatment, we will call you to review the results.   Testing/Procedures: No new testing needed   Follow-Up: At [facility], you and your health needs are our priority.  As part of our continuing mission to provide you with exceptional heart care, we have created designated Provider Care Teams.  These Care Teams include your primary Cardiologist (physician) and Advanced Practice Providers (APPs -  Physician Assistants and Nurse Practitioners) who all work together to provide you with the care you need, when you need it.  . You will need a follow up appointment in 12 months .   Please call our office 2 months in advance to schedule this appointment.    . Providers on your designated Care Team:   . Murray Hodgkins, NP . Christell Faith, PA-C . Marrianne Mood, PA-C  Any Other Special Instructions Will Be Listed Below (If Applicable).  For educational health videos Log in to : [email or web address] Or : [email or web address], password : [email or web address]

## 2018-02-22 NOTE — Telephone Encounter (Signed)
Patient was in office this morning  States that Dr Rockey Situ requested to know MG of lisinopril  States it is 10 MG

## 2018-02-22 NOTE — Telephone Encounter (Signed)
Routed to Dr. Rockey Situ per pt request.

## 2018-02-26 NOTE — Telephone Encounter (Signed)
Lisinopril 20 mg has been taken off.

## 2018-02-26 NOTE — Telephone Encounter (Signed)
Can we update his medication list Erase lisinopril 20

## 2018-03-04 ENCOUNTER — Encounter: Payer: PPO | Admitting: Family Medicine

## 2018-03-18 DIAGNOSIS — M1712 Unilateral primary osteoarthritis, left knee: Secondary | ICD-10-CM | POA: Diagnosis not present

## 2018-03-18 DIAGNOSIS — M25562 Pain in left knee: Secondary | ICD-10-CM | POA: Diagnosis not present

## 2018-03-29 ENCOUNTER — Ambulatory Visit (INDEPENDENT_AMBULATORY_CARE_PROVIDER_SITE_OTHER): Payer: PPO | Admitting: Family Medicine

## 2018-03-29 ENCOUNTER — Encounter: Payer: Self-pay | Admitting: Family Medicine

## 2018-03-29 VITALS — BP 127/70 | HR 65 | Temp 97.9°F | Ht 70.0 in | Wt 185.9 lb

## 2018-03-29 DIAGNOSIS — E7800 Pure hypercholesterolemia, unspecified: Secondary | ICD-10-CM

## 2018-03-29 DIAGNOSIS — I10 Essential (primary) hypertension: Secondary | ICD-10-CM | POA: Diagnosis not present

## 2018-03-29 DIAGNOSIS — E782 Mixed hyperlipidemia: Secondary | ICD-10-CM

## 2018-03-29 DIAGNOSIS — Z7189 Other specified counseling: Secondary | ICD-10-CM | POA: Diagnosis not present

## 2018-03-29 DIAGNOSIS — I480 Paroxysmal atrial fibrillation: Secondary | ICD-10-CM | POA: Diagnosis not present

## 2018-03-29 DIAGNOSIS — N400 Enlarged prostate without lower urinary tract symptoms: Secondary | ICD-10-CM

## 2018-03-29 LAB — URINALYSIS, ROUTINE W REFLEX MICROSCOPIC
Bilirubin, UA: NEGATIVE
GLUCOSE, UA: NEGATIVE
Ketones, UA: NEGATIVE
Leukocytes, UA: NEGATIVE
Nitrite, UA: NEGATIVE
Protein, UA: NEGATIVE
Specific Gravity, UA: >1.03 — ABNORMAL HIGH (ref 1.005–1.030)
Urobilinogen, Ur: 0.2 mg/dL (ref 0.2–1.0)
pH, UA: 5.5 (ref 5.0–7.5)

## 2018-03-29 LAB — MICROSCOPIC EXAMINATION: Bacteria, UA: NONE SEEN

## 2018-03-29 MED ORDER — LISINOPRIL 10 MG PO TABS: 10.0000 mg | ORAL_TABLET | Freq: Every day | ORAL | 4 refills | Status: DC

## 2018-03-29 MED ORDER — RIVAROXABAN 20 MG PO TABS: 20.0000 mg | ORAL_TABLET | Freq: Every day | ORAL | 4 refills | Status: DC

## 2018-03-29 MED ORDER — ATORVASTATIN CALCIUM 40 MG PO TABS: 40.0000 mg | ORAL_TABLET | Freq: Every day | ORAL | 4 refills | Status: DC

## 2018-03-29 MED ORDER — METOPROLOL SUCCINATE ER 50 MG PO TB24: 50.0000 mg | ORAL_TABLET | Freq: Every day | ORAL | 4 refills | Status: DC

## 2018-03-29 NOTE — Assessment & Plan Note (Signed)
The current medical regimen is effective;  continue present plan and medications.  

## 2018-03-29 NOTE — Assessment & Plan Note (Signed)
stable °

## 2018-03-29 NOTE — Progress Notes (Signed)
BP 127/70 (BP Location: Left Arm, Patient Position: Sitting, Cuff Size: Normal)   Pulse 65   Temp 97.9 F (36.6 C) (Oral)   Ht 5\' 10"  (1.778 m)   Wt 185 lb 14.4 oz (84.3 kg)   SpO2 97%   BMI 26.67 kg/m    Subjective:    Patient ID: Neil Bush, male    DOB: [birth year], 68 y.o.   MRN: [id number]  HPI: Neil Bush is a 68 y.o. male  Chief Complaint  Patient presents with  . Annual Exam  Patient follow-up all in all doing well taking blood pressure medicine without complaints or issues good control takes Xarelto without bleeding bruising issues also takes cholesterol medicine without problems or complaints.   Relevant past medical, surgical, family and social history reviewed and updated as indicated. Interim medical history since our last visit reviewed. Allergies and medications reviewed and updated.  Review of Systems  Constitutional: Negative.   HENT: Negative.   Eyes: Negative.   Respiratory: Negative.   Cardiovascular: Negative.   Gastrointestinal: Negative.   Endocrine: Negative.   Genitourinary: Negative.   Musculoskeletal: Negative.   Skin: Negative.   Allergic/Immunologic: Negative.   Neurological: Negative.   Hematological: Negative.   Psychiatric/Behavioral: Negative.     Per HPI unless specifically indicated above     Objective:    BP 127/70 (BP Location: Left Arm, Patient Position: Sitting, Cuff Size: Normal)   Pulse 65   Temp 97.9 F (36.6 C) (Oral)   Ht 5\' 10"  (1.778 m)   Wt 185 lb 14.4 oz (84.3 kg)   SpO2 97%   BMI 26.67 kg/m   Wt Readings from Last 3 Encounters:  03/29/18 185 lb 14.4 oz (84.3 kg)  02/22/18 184 lb (83.5 kg)  10/06/17 184 lb (83.5 kg)    Physical Exam Constitutional:      Appearance: He is well-developed.  HENT:     Head: Normocephalic.     Right Ear: External ear normal.     Left Ear: External ear normal.     Nose: Nose normal.  Eyes:     Conjunctiva/sclera: Conjunctivae normal.     Pupils: Pupils are equal,  round, and reactive to light.  Neck:     Musculoskeletal: Normal range of motion and neck supple.     Thyroid: No thyromegaly.  Cardiovascular:     Rate and Rhythm: Normal rate and regular rhythm.     Heart sounds: Normal heart sounds.  Pulmonary:     Effort: Pulmonary effort is normal.     Breath sounds: Normal breath sounds.  Abdominal:     General: Bowel sounds are normal.     Palpations: Abdomen is soft. There is no hepatomegaly or splenomegaly.  Genitourinary:    Penis: Normal.   Musculoskeletal: Normal range of motion.  Lymphadenopathy:     Cervical: No cervical adenopathy.  Skin:    General: Skin is warm and dry.  Neurological:     Mental Status: He is alert and oriented to person, place, and time.     Deep Tendon Reflexes: Reflexes are normal and symmetric.  Psychiatric:        Behavior: Behavior normal.        Thought Content: Thought content normal.        Judgment: Judgment normal.     Results for orders placed or performed in visit on 10/06/17  Microscopic Examination  Result Value Ref Range   WBC, UA 0-5 0 - 5 /hpf  RBC, UA 0-2 0 - 2 /hpf   Epithelial Cells (non renal) CANCELED    Mucus, UA Present Not Estab.   Bacteria, UA None seen None seen/Few  CBC  Result Value Ref Range   WBC 7.4 3.4 - 10.8 x10E3/uL   RBC 4.88 4.14 - 5.80 x10E6/uL   Hemoglobin 15.8 13.0 - 17.7 g/dL   Hematocrit 47.3 37.5 - 51.0 %   MCV 97 79 - 97 fL   MCH 32.4 26.6 - 33.0 pg   MCHC 33.4 31.5 - 35.7 g/dL   RDW 14.2 12.3 - 15.4 %   Platelets 245 150 - 450 D32I7/TI  Basic metabolic panel  Result Value Ref Range   Glucose 126 (H) 65 - 99 mg/dL   BUN 18 8 - 27 mg/dL   Creatinine, Ser 1.16 0.76 - 1.27 mg/dL   GFR calc non Af Amer 64 >59 mL/min/1.73   GFR calc Af Amer 74 >59 mL/min/1.73   BUN/Creatinine Ratio 16 10 - 24   Sodium 141 134 - 144 mmol/L   Potassium 4.2 3.5 - 5.2 mmol/L   Chloride 101 96 - 106 mmol/L   CO2 23 20 - 29 mmol/L   Calcium 8.9 8.6 - 10.2 mg/dL    Microalbumin, Urine Waived  Result Value Ref Range   Microalb, Ur Waived 30 (H) 0 - 19 mg/L   Creatinine, Urine Waived 300 10 - 300 mg/dL   Microalb/Creat Ratio <30 <30 mg/g  Bayer DCA Hb A1c Waived  Result Value Ref Range   HB A1C (BAYER DCA - WAIVED) 5.0 <7.0 %  UA/M w/rflx Culture, Routine  Result Value Ref Range   Specific Gravity, UA >1.030 (H) 1.005 - 1.030   pH, UA 5.0 5.0 - 7.5   Color, UA Orange Yellow   Appearance Ur Hazy (A) Clear   Leukocytes, UA Negative Negative   Protein, UA Negative Negative/Trace   Glucose, UA Negative Negative   Ketones, UA Negative Negative   RBC, UA Trace (A) Negative   Bilirubin, UA Negative Negative   Urobilinogen, Ur 1.0 0.2 - 1.0 mg/dL   Nitrite, UA Negative Negative   Microscopic Examination See below:   LP+ALT+AST Piccolo, Waived  Result Value Ref Range   ALT (SGPT) Piccolo, Waived 23 10 - 47 U/L   AST (SGOT) Piccolo, Waived 28 11 - 38 U/L   Cholesterol Piccolo, Waived 137 <200 mg/dL   HDL Chol Piccolo, Waived 43 (L) >59 mg/dL   Triglycerides Piccolo,Waived 269 (H) <150 mg/dL   Chol/HDL Ratio Piccolo,Waive 3.2 mg/dL   LDL Chol Calc Piccolo Waived 40 <100 mg/dL   VLDL Chol Calc Piccolo,Waive 54 (H) <30 mg/dL      Assessment & Plan:   Problem List Items Addressed This Visit      Cardiovascular and Mediastinum   Essential hypertension    The current medical regimen is effective;  continue present plan and medications.       Relevant Medications   rivaroxaban (XARELTO) 20 MG TABS tablet   atorvastatin (LIPITOR) 40 MG tablet   metoprolol succinate (TOPROL-XL) 50 MG 24 hr tablet   lisinopril (PRINIVIL,ZESTRIL) 10 MG tablet   Other Relevant Orders   Comprehensive metabolic panel   CBC with Differential/Platelet   TSH   Urinalysis, Routine w reflex microscopic   Paroxysmal atrial fibrillation (HCC)    The current medical regimen is effective;  continue present plan and medications.       Relevant Medications    rivaroxaban (XARELTO) 20 MG  TABS tablet   atorvastatin (LIPITOR) 40 MG tablet   metoprolol succinate (TOPROL-XL) 50 MG 24 hr tablet   lisinopril (PRINIVIL,ZESTRIL) 10 MG tablet   Other Relevant Orders   Comprehensive metabolic panel   CBC with Differential/Platelet   TSH   Urinalysis, Routine w reflex microscopic     Genitourinary   BPH (benign prostatic hyperplasia)    stable      Relevant Orders   TSH   PSA     Other   Hyperlipidemia    The current medical regimen is effective;  continue present plan and medications.       Relevant Medications   rivaroxaban (XARELTO) 20 MG TABS tablet   atorvastatin (LIPITOR) 40 MG tablet   metoprolol succinate (TOPROL-XL) 50 MG 24 hr tablet   lisinopril (PRINIVIL,ZESTRIL) 10 MG tablet   Other Relevant Orders   Comprehensive metabolic panel   Lipid panel   CBC with Differential/Platelet   TSH   Advanced care planning/counseling discussion - Primary    A voluntary discussion about advanced care planning including explanation and discussion of advanced directives was extentively discussed with the patient.  Explained about the healthcare proxy and living will was reviewed and packet with forms with expiration of how to fill them out was given.  Time spent: Encounter 16+ min individuals present: Patient          Follow up plan: Return in about 6 months (around 09/27/2018) for BMP,  Lipids, ALT, AST.

## 2018-03-29 NOTE — Assessment & Plan Note (Signed)
A voluntary discussion about advanced care planning including explanation and discussion of advanced directives was extentively discussed with the patient.  Explained about the healthcare proxy and living will was reviewed and packet with forms with expiration of how to fill them out was given.  Time spent: Encounter 16+ min individuals present: Patient 

## 2018-03-30 ENCOUNTER — Encounter: Payer: Self-pay | Admitting: Family Medicine

## 2018-03-30 LAB — CBC WITH DIFFERENTIAL/PLATELET
BASOS ABS: 0.1 10*3/uL (ref 0.0–0.2)
Basos: 1 %
EOS (ABSOLUTE): 0.1 10*3/uL (ref 0.0–0.4)
Eos: 1 %
HEMOGLOBIN: 15.5 g/dL (ref 13.0–17.7)
Hematocrit: 44.5 % (ref 37.5–51.0)
Immature Grans (Abs): 0.1 10*3/uL (ref 0.0–0.1)
Immature Granulocytes: 1 %
LYMPHS ABS: 2.2 10*3/uL (ref 0.7–3.1)
LYMPHS: 26 %
MCH: 33.3 pg — AB (ref 26.6–33.0)
MCHC: 34.8 g/dL (ref 31.5–35.7)
MCV: 96 fL (ref 79–97)
MONOCYTES: 8 %
Monocytes Absolute: 0.7 10*3/uL (ref 0.1–0.9)
NEUTROS ABS: 5.5 10*3/uL (ref 1.4–7.0)
Neutrophils: 63 %
PLATELETS: 226 10*3/uL (ref 150–450)
RBC: 4.66 x10E6/uL (ref 4.14–5.80)
RDW: 13 % (ref 11.6–15.4)
WBC: 8.7 10*3/uL (ref 3.4–10.8)

## 2018-03-30 LAB — COMPREHENSIVE METABOLIC PANEL
ALT: 17 IU/L (ref 0–44)
AST: 12 IU/L (ref 0–40)
Albumin/Globulin Ratio: 2.1 (ref 1.2–2.2)
Albumin: 4.2 g/dL (ref 3.8–4.8)
Alkaline Phosphatase: 78 IU/L (ref 39–117)
BUN/Creatinine Ratio: 19 (ref 10–24)
BUN: 22 mg/dL (ref 8–27)
Bilirubin Total: 0.5 mg/dL (ref 0.0–1.2)
CALCIUM: 8.9 mg/dL (ref 8.6–10.2)
CO2: 21 mmol/L (ref 20–29)
CREATININE: 1.17 mg/dL (ref 0.76–1.27)
Chloride: 105 mmol/L (ref 96–106)
GFR calc non Af Amer: 63 mL/min/{1.73_m2} (ref >59)
GFR, EST AFRICAN AMERICAN: 73 mL/min/{1.73_m2} (ref >59)
GLOBULIN, TOTAL: 2 g/dL (ref 1.5–4.5)
Glucose: 92 mg/dL (ref 65–99)
POTASSIUM: 4.1 mmol/L (ref 3.5–5.2)
SODIUM: 141 mmol/L (ref 134–144)
Total Protein: 6.2 g/dL (ref 6.0–8.5)

## 2018-03-30 LAB — LIPID PANEL
Chol/HDL Ratio: 2.8 ratio (ref 0.0–5.0)
Cholesterol, Total: 132 mg/dL (ref 100–199)
HDL: 48 mg/dL (ref >39)
LDL CALC: 60 mg/dL (ref 0–99)
TRIGLYCERIDES: 118 mg/dL (ref 0–149)
VLDL Cholesterol Cal: 24 mg/dL (ref 5–40)

## 2018-03-30 LAB — TSH: TSH: 1.74 u[IU]/mL (ref 0.450–4.500)

## 2018-03-30 LAB — PSA: Prostate Specific Ag, Serum: 0.9 ng/mL (ref 0.0–4.0)

## 2018-05-25 DIAGNOSIS — Z9889 Other specified postprocedural states: Secondary | ICD-10-CM | POA: Diagnosis not present

## 2018-05-25 DIAGNOSIS — M1712 Unilateral primary osteoarthritis, left knee: Secondary | ICD-10-CM | POA: Diagnosis not present

## 2018-06-09 ENCOUNTER — Other Ambulatory Visit: Payer: Self-pay | Admitting: Family Medicine

## 2018-06-10 ENCOUNTER — Other Ambulatory Visit: Payer: Self-pay | Admitting: Family Medicine

## 2018-07-20 ENCOUNTER — Telehealth: Payer: Self-pay | Admitting: Cardiovascular Disease

## 2018-07-20 NOTE — Telephone Encounter (Signed)
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

## 2018-07-20 NOTE — Telephone Encounter (Signed)
Spoke with patient to see if has had his physical done yet and he reports having it done today. He did leave the forms we need to complete up front. 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 He was appreciative for the call with no further questions at this time.

## 2018-07-21 NOTE — Telephone Encounter (Signed)
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 Routing to her to make her aware.

## 2018-07-21 NOTE — Telephone Encounter (Signed)
Yes if he could get those done before he leaves this week I am sure the patient will be grateful.

## 2018-07-22 NOTE — Telephone Encounter (Signed)
Forms placed on Dr Donivan Scull desk for his review and completion.

## 2018-07-23 NOTE — Telephone Encounter (Signed)
Form completed and faxed to number provided.   Called and notified patient. He was very Patent attorney. He asked that I mail him the original.  Document put in the mail.

## 2018-07-29 DIAGNOSIS — Z9889 Other specified postprocedural states: Secondary | ICD-10-CM | POA: Diagnosis not present

## 2018-07-29 DIAGNOSIS — M1712 Unilateral primary osteoarthritis, left knee: Secondary | ICD-10-CM | POA: Diagnosis not present

## 2018-08-24 DIAGNOSIS — M1712 Unilateral primary osteoarthritis, left knee: Secondary | ICD-10-CM | POA: Diagnosis not present

## 2018-08-26 DIAGNOSIS — L570 Actinic keratosis: Secondary | ICD-10-CM | POA: Diagnosis not present

## 2018-08-26 DIAGNOSIS — Q808 Other congenital ichthyosis: Secondary | ICD-10-CM | POA: Diagnosis not present

## 2018-08-26 DIAGNOSIS — D485 Neoplasm of uncertain behavior of skin: Secondary | ICD-10-CM | POA: Diagnosis not present

## 2018-08-26 DIAGNOSIS — Z08 Encounter for follow-up examination after completed treatment for malignant neoplasm: Secondary | ICD-10-CM | POA: Diagnosis not present

## 2018-08-26 DIAGNOSIS — Z85828 Personal history of other malignant neoplasm of skin: Secondary | ICD-10-CM | POA: Diagnosis not present

## 2018-08-26 DIAGNOSIS — C44519 Basal cell carcinoma of skin of other part of trunk: Secondary | ICD-10-CM | POA: Diagnosis not present

## 2018-08-26 DIAGNOSIS — X32XXXA Exposure to sunlight, initial encounter: Secondary | ICD-10-CM | POA: Diagnosis not present

## 2018-08-26 DIAGNOSIS — C44212 Basal cell carcinoma of skin of right ear and external auricular canal: Secondary | ICD-10-CM | POA: Diagnosis not present

## 2018-08-26 DIAGNOSIS — L821 Other seborrheic keratosis: Secondary | ICD-10-CM | POA: Diagnosis not present

## 2018-09-28 ENCOUNTER — Ambulatory Visit (INDEPENDENT_AMBULATORY_CARE_PROVIDER_SITE_OTHER): Payer: PPO | Admitting: Family Medicine

## 2018-09-28 ENCOUNTER — Other Ambulatory Visit: Payer: Self-pay

## 2018-09-28 ENCOUNTER — Encounter: Payer: Self-pay | Admitting: Family Medicine

## 2018-09-28 DIAGNOSIS — I480 Paroxysmal atrial fibrillation: Secondary | ICD-10-CM

## 2018-09-28 DIAGNOSIS — E782 Mixed hyperlipidemia: Secondary | ICD-10-CM | POA: Diagnosis not present

## 2018-09-28 DIAGNOSIS — I10 Essential (primary) hypertension: Secondary | ICD-10-CM

## 2018-09-28 MED ORDER — RIVAROXABAN 20 MG PO TABS: 20.0000 mg | ORAL_TABLET | Freq: Every day | ORAL | 2 refills | Status: DC

## 2018-09-28 NOTE — Assessment & Plan Note (Signed)
The current medical regimen is effective;  continue present plan and medications.  

## 2018-09-28 NOTE — Progress Notes (Signed)
BP 117/79   Wt 182 lb (82.6 kg)   BMI 26.11 kg/m    Subjective:    Patient ID: Neil Bush, male    DOB: Sep 23, 1950, 68 y.o.   MRN: [id number]  HPI: Neil Bush is a 68 y.o. male  Med check  Discussed with patient doing well with medication no complaints from cholesterol or blood pressure good control on home readings. H refilled seems to be controlled no problems with Xarelto no bleeding bruising issues.  Does notice occasional irregular heartbeats is able to cough and get back to normal sinus rhythm.  Relevant past medical, surgical, family and social history reviewed and updated as indicated. Interim medical history since our last visit reviewed. Allergies and medications reviewed and updated.  Review of Systems  Constitutional: Negative.   Respiratory: Negative.   Cardiovascular: Negative.     Per HPI unless specifically indicated above     Objective:    BP 117/79   Wt 182 lb (82.6 kg)   BMI 26.11 kg/m   Wt Readings from Last 3 Encounters:  09/28/18 182 lb (82.6 kg)  03/29/18 185 lb 14.4 oz (84.3 kg)  02/22/18 184 lb (83.5 kg)    Physical Exam  Results for orders placed or performed in visit on 03/29/18  Microscopic Examination   URINE  Result Value Ref Range   WBC, UA 0-5 0 - 5 /hpf   RBC, UA 0-2 0 - 2 /hpf   Epithelial Cells (non renal) 0-10 0 - 10 /hpf   Renal Epithel, UA 0-10 (A) None seen /hpf   Mucus, UA Present Not Estab.   Bacteria, UA None seen None seen/Few  Comprehensive metabolic panel  Result Value Ref Range   Glucose 92 65 - 99 mg/dL   BUN 22 8 - 27 mg/dL   Creatinine, Ser 1.17 0.76 - 1.27 mg/dL   GFR calc non Af Amer 63 >59 mL/min/1.73   GFR calc Af Amer 73 >59 mL/min/1.73   BUN/Creatinine Ratio 19 10 - 24   Sodium 141 134 - 144 mmol/L   Potassium 4.1 3.5 - 5.2 mmol/L   Chloride 105 96 - 106 mmol/L   CO2 21 20 - 29 mmol/L   Calcium 8.9 8.6 - 10.2 mg/dL   Total Protein 6.2 6.0 - 8.5 g/dL   Albumin 4.2 3.8 - 4.8 g/dL   Globulin, Total 2.0 1.5 - 4.5 g/dL   Albumin/Globulin Ratio 2.1 1.2 - 2.2   Bilirubin Total 0.5 0.0 - 1.2 mg/dL   Alkaline Phosphatase 78 39 - 117 IU/L   AST 12 0 - 40 IU/L   ALT 17 0 - 44 IU/L  Lipid panel  Result Value Ref Range   Cholesterol, Total 132 100 - 199 mg/dL   Triglycerides 118 0 - 149 mg/dL   HDL 48 >39 mg/dL   VLDL Cholesterol Cal 24 5 - 40 mg/dL   LDL Calculated 60 0 - 99 mg/dL   Chol/HDL Ratio 2.8 0.0 - 5.0 ratio  CBC with Differential/Platelet  Result Value Ref Range   WBC 8.7 3.4 - 10.8 x10E3/uL   RBC 4.66 4.14 - 5.80 x10E6/uL   Hemoglobin 15.5 13.0 - 17.7 g/dL   Hematocrit 44.5 37.5 - 51.0 %   MCV 96 79 - 97 fL   MCH 33.3 (H) 26.6 - 33.0 pg   MCHC 34.8 31.5 - 35.7 g/dL   RDW 13.0 11.6 - 15.4 %   Platelets 226 150 - 450 x10E3/uL   Neutrophils  63 Not Estab. %   Lymphs 26 Not Estab. %   Monocytes 8 Not Estab. %   Eos 1 Not Estab. %   Basos 1 Not Estab. %   Neutrophils Absolute 5.5 1.4 - 7.0 x10E3/uL   Lymphocytes Absolute 2.2 0.7 - 3.1 x10E3/uL   Monocytes Absolute 0.7 0.1 - 0.9 x10E3/uL   EOS (ABSOLUTE) 0.1 0.0 - 0.4 x10E3/uL   Basophils Absolute 0.1 0.0 - 0.2 x10E3/uL   Immature Granulocytes 1 Not Estab. %   Immature Grans (Abs) 0.1 0.0 - 0.1 x10E3/uL  TSH  Result Value Ref Range   TSH 1.740 0.450 - 4.500 uIU/mL  Urinalysis, Routine w reflex microscopic  Result Value Ref Range   Specific Gravity, UA >1.030 (H) 1.005 - 1.030   pH, UA 5.5 5.0 - 7.5   Color, UA Yellow Yellow   Appearance Ur Clear Clear   Leukocytes, UA Negative Negative   Protein, UA Negative Negative/Trace   Glucose, UA Negative Negative   Ketones, UA Negative Negative   RBC, UA 1+ (A) Negative   Bilirubin, UA Negative Negative   Urobilinogen, Ur 0.2 0.2 - 1.0 mg/dL   Nitrite, UA Negative Negative   Microscopic Examination See below:   PSA  Result Value Ref Range   Prostate Specific Ag, Serum 0.9 0.0 - 4.0 ng/mL      Assessment & Plan:   Problem List Items Addressed This  Visit      Cardiovascular and Mediastinum   Essential hypertension    The current medical regimen is effective;  continue present plan and medications.       Relevant Medications   rivaroxaban (XARELTO) 20 MG TABS tablet   Other Relevant Orders   Basic metabolic panel   Paroxysmal atrial fibrillation (HCC)    The current medical regimen is effective;  continue present plan and medications.       Relevant Medications   rivaroxaban (XARELTO) 20 MG TABS tablet   Other Relevant Orders   CBC with Differential/Platelet     Other   Hyperlipidemia    The current medical regimen is effective;  continue present plan and medications.       Relevant Medications   rivaroxaban (XARELTO) 20 MG TABS tablet   Other Relevant Orders   LP+ALT+AST Piccolo, Waived       Telemedicine using audio/video telecommunications for a synchronous communication visit. Today's visit due to COVID-19 isolation precautions I connected with and verified that I am speaking with the correct person using two identifiers.   I discussed the limitations, risks, security and privacy concerns of performing an evaluation and management service by telecommunication and the availability of in person appointments. I also discussed with the patient that there may be a patient responsible charge related to this service. The patient expressed understanding and agreed to proceed. The patient's location is home. I am at home.   I discussed the assessment and treatment plan with the patient. The patient was provided an opportunity to ask questions and all were answered. The patient agreed with the plan and demonstrated an understanding of the instructions.   The patient was advised to call back or seek an in-person evaluation if the symptoms worsen or if the condition fails to improve as anticipated.   I provided 21+ minutes of time during this encounter. Follow up plan: Return in about 6 months (around 03/31/2019) for  Physical Exam.

## 2018-09-29 ENCOUNTER — Encounter: Payer: Self-pay | Admitting: Emergency Medicine

## 2018-09-29 ENCOUNTER — Emergency Department: Payer: PPO

## 2018-09-29 ENCOUNTER — Telehealth: Payer: Self-pay | Admitting: *Deleted

## 2018-09-29 ENCOUNTER — Other Ambulatory Visit: Payer: Self-pay

## 2018-09-29 ENCOUNTER — Emergency Department
Admission: EM | Admit: 2018-09-29 | Discharge: 2018-09-29 | Disposition: A | Discharge status: Home / Self Care | Payer: PPO | Attending: Emergency Medicine | Admitting: Emergency Medicine

## 2018-09-29 DIAGNOSIS — I248 Other forms of acute ischemic heart disease: Secondary | ICD-10-CM

## 2018-09-29 DIAGNOSIS — R0602 Shortness of breath: Secondary | ICD-10-CM | POA: Diagnosis not present

## 2018-09-29 DIAGNOSIS — Z7901 Long term (current) use of anticoagulants: Secondary | ICD-10-CM | POA: Insufficient documentation

## 2018-09-29 DIAGNOSIS — I480 Paroxysmal atrial fibrillation: Secondary | ICD-10-CM | POA: Diagnosis not present

## 2018-09-29 DIAGNOSIS — I10 Essential (primary) hypertension: Secondary | ICD-10-CM | POA: Insufficient documentation

## 2018-09-29 DIAGNOSIS — Z79899 Other long term (current) drug therapy: Secondary | ICD-10-CM | POA: Insufficient documentation

## 2018-09-29 DIAGNOSIS — R7989 Other specified abnormal findings of blood chemistry: Secondary | ICD-10-CM | POA: Insufficient documentation

## 2018-09-29 DIAGNOSIS — R0789 Other chest pain: Secondary | ICD-10-CM | POA: Insufficient documentation

## 2018-09-29 DIAGNOSIS — I4891 Unspecified atrial fibrillation: Secondary | ICD-10-CM | POA: Diagnosis present

## 2018-09-29 DIAGNOSIS — R778 Other specified abnormalities of plasma proteins: Secondary | ICD-10-CM

## 2018-09-29 DIAGNOSIS — R079 Chest pain, unspecified: Secondary | ICD-10-CM

## 2018-09-29 LAB — CBC
HCT: 45.3 % (ref 39.0–52.0)
Hemoglobin: 15.9 g/dL (ref 13.0–17.0)
MCH: 32.4 pg (ref 26.0–34.0)
MCHC: 35.1 g/dL (ref 30.0–36.0)
MCV: 92.3 fL (ref 80.0–100.0)
Platelets: 248 10*3/uL (ref 150–400)
RBC: 4.91 MIL/uL (ref 4.22–5.81)
RDW: 13.4 % (ref 11.5–15.5)
WBC: 9.7 10*3/uL (ref 4.0–10.5)
nRBC: 0 % (ref 0.0–0.2)

## 2018-09-29 LAB — BASIC METABOLIC PANEL
Anion gap: 9 (ref 5–15)
BUN: 39 mg/dL — ABNORMAL HIGH (ref 8–23)
CO2: 22 mmol/L (ref 22–32)
Calcium: 8.7 mg/dL — ABNORMAL LOW (ref 8.9–10.3)
Chloride: 111 mmol/L (ref 98–111)
Creatinine, Ser: 1.2 mg/dL (ref 0.61–1.24)
GFR calc Af Amer: >60 mL/min (ref >60)
GFR calc non Af Amer: >60 mL/min (ref >60)
Glucose, Bld: 103 mg/dL — ABNORMAL HIGH (ref 70–99)
Potassium: 4.1 mmol/L (ref 3.5–5.1)
Sodium: 142 mmol/L (ref 135–145)

## 2018-09-29 LAB — TROPONIN I (HIGH SENSITIVITY)
Troponin I (High Sensitivity): 99 ng/L — ABNORMAL HIGH (ref <18)
Troponin I (High Sensitivity): 219 ng/L (ref <18)

## 2018-09-29 MED ORDER — ALUM & MAG HYDROXIDE-SIMETH 200-200-20 MG/5ML PO SUSP
15.0000 mL | Freq: Once | ORAL | Status: AC
  Administered 2018-09-29: 15 mL via ORAL
  Filled 2018-09-29: qty 30

## 2018-09-29 NOTE — ED Provider Notes (Addendum)
[facility] Emergency Department Provider Note  ____________________________________________   I have reviewed the triage vital signs and the nursing notes. Where available I have reviewed prior notes and, if possible and indicated, outside hospital notes.   Patient seen and evaluated during the coronavirus epidemic during a time with low staffing  Patient seen for the symptoms described in the history of present illness. She was evaluated in the context of the global COVID-19 pandemic, which necessitated consideration that the patient might be at risk for infection with the SARS-CoV-2 virus that causes COVID-19. Institutional protocols and algorithms that pertain to the evaluation of patients at risk for COVID-19 are in a state of rapid change based on information released by regulatory bodies including the CDC and federal and state organizations. These policies and algorithms were followed during the patient's care in the ED.    HISTORY  Chief Complaint Chest Pain and Atrial Fibrillation    HPI Neil Bush is a 68 y.o. male who presents today complaining of having had atrial fibrillation this morning.  Patient does have a history of paroxysmal atrial fibrillation and he is on Xarelto.  He states however this time he had some chest pressure with that and some shortness of breath and felt a little bit diaphoretic.  Patient converted to atrial fibrillation while in the department at this time he feels much better.  Pressures "about gone".  Began this morning.  He states usually he can tell when he is in atrial fibrillation he comes in out of it every couple months.  He does not usually have chest pressure with it.  He has had some exertional symptoms recently he was loading hay a few days ago and he states he became very winded which is somewhat atypical for him.  Of course it was 98 degrees out.  He denies any nausea or vomiting or febrile illness.  No recent travel no  known coronavirus exposure.  Upon arrival he was in atrial fibrillation however, he self converted.     Past Medical History:  Diagnosis Date  . Chest pain    a. 01/2016 in setting of Afib;  b. 02/2016 Ex MV: EF 55-65%, no ischemia.  Marland Kitchen Dysrhythmia   . Headache   . Hyperlipidemia   . Hypertension   . PAF (paroxysmal atrial fibrillation) ([place])    a. 01/2016 - admission for rapid AF and c/p-->converted on IV dilt;  b. CHA2DS2VASc = 2-->Xarelto;  c. 01/2016 Echo: EF 55-60%, triv AI.  Marland Kitchen Palpitations    a. 08/2016 Zio Patch: symptomatic PAC's and brief (max 11 beats) of SVT/PAT (3 episodes).    Patient Active Problem List   Diagnosis Date Noted  . Advanced care planning/counseling discussion 03/29/2018  . Personal history of colonic polyps   . Polyp of sigmoid colon   . Benign neoplasm of ascending colon   . Benign neoplasm of transverse colon   . Nonrheumatic aortic valve insufficiency   . Chest pain 01/26/2016  . Paroxysmal atrial fibrillation ([place]) 01/26/2016  . Near syncope 11/20/2015  . 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 BPH (benign prostatic hyperplasia) 12/26/2014  . Hyperlipidemia   . Essential hypertension     Past Surgical History:  Procedure Laterality Date  . CARDIAC CATHETERIZATION     [facility]   . CHOLECYSTECTOMY    . COLONOSCOPY WITH PROPOFOL N/A 04/17/2017   Procedure: COLONOSCOPY WITH PROPOFOL;  Surgeon: Lucilla Lame, MD;  Location: [place];  Service: Endoscopy;  Laterality: N/A;  .  EYE SURGERY    . FOOT SURGERY    . HERNIA REPAIR     X 2 ingunial  . KNEE SURGERY Right   . POLYPECTOMY  04/17/2017   Procedure: POLYPECTOMY INTESTINAL;  Surgeon: Lucilla Lame, MD;  Location: [place];  Service: Endoscopy;;    Prior to Admission medications   Medication Sig Start Date End Date Taking? Authorizing Provider  atorvastatin (LIPITOR) 40 MG tablet Take 1 tablet (40 mg total) by mouth daily at 6 PM. 03/29/18   Crissman, Jeannette How, MD  lisinopril  (PRINIVIL,ZESTRIL) 10 MG tablet Take 1 tablet (10 mg total) by mouth daily. 03/29/18   Guadalupe Maple, MD  metoprolol succinate (TOPROL-XL) 50 MG 24 hr tablet Take 1 tablet (50 mg total) by mouth daily. Take with or immediately following a meal. 03/29/18 06/27/18  Guadalupe Maple, MD  rivaroxaban (XARELTO) 20 MG TABS tablet Take 1 tablet (20 mg total) by mouth daily. 09/28/18   Guadalupe Maple, MD    Allergies Penicillins  Family History  Problem Relation Age of Onset  . Cancer Mother   . Heart disease Father   . Diabetes Father   . Hypertension Father   . Heart disease Brother   . COPD Neg Hx   . Stroke Neg Hx     Social History Social History   Tobacco Use  . Smoking status: Never Smoker  . Smokeless tobacco: Never Used  Substance Use Topics  . Alcohol use: No    Alcohol/week: 0.0 standard drinks  . Drug use: No    Review of Systems Constitutional: No fever/chills Eyes: No visual changes. ENT: No sore throat. No stiff neck no neck pain Cardiovascular: + chest pain. Respiratory: + shortness of breath. Gastrointestinal:   no vomiting.  No diarrhea.  No constipation. Genitourinary: Negative for dysuria. Musculoskeletal: Negative lower extremity swelling Skin: Negative for rash. Neurological: Negative for severe headaches, focal weakness or numbness.   ____________________________________________   PHYSICAL EXAM:  VITAL SIGNS: ED Triage Vitals  Enc Vitals Group     BP 09/29/18 1141 (!) 140/118     Pulse Rate 09/29/18 1140 (!) 43     Resp 09/29/18 1140 12     Temp 09/29/18 1141 98.4 F (36.9 C)     Temp Source 09/29/18 1141 Oral     SpO2 09/29/18 1140 95 %     Weight 09/29/18 1148 182 lb (82.6 kg)     Height 09/29/18 1148 5\' 10"  (1.778 m)     Head Circumference --      Peak Flow --      Pain Score 09/29/18 1147 2     Pain Loc --      Pain Edu? --      Excl. in [place]? --     Constitutional: Alert and oriented. Well appearing and in no acute  distress. Eyes: Conjunctivae are normal Head: Atraumatic HEENT: No congestion/rhinnorhea. Mucous membranes are moist.  Oropharynx non-erythematous Neck:   Nontender with no meningismus, no masses, no stridor Cardiovascular: Normal rate, regular rhythm. Grossly normal heart sounds.  Good peripheral circulation. Respiratory: Normal respiratory effort.  No retractions. Lungs CTAB. Abdominal: Soft and nontender. No distention. No guarding no rebound Back:  There is no focal tenderness or step off.  there is no midline tenderness there are no lesions noted. there is no CVA tenderness Musculoskeletal: No lower extremity tenderness, no upper extremity tenderness. No joint effusions, no DVT signs strong distal pulses no edema Neurologic:  Normal speech and language. No gross focal neurologic deficits are appreciated.  Skin:  Skin is warm, dry and intact. No rash noted. Psychiatric: Mood and affect are normal. Speech and behavior are normal.  ____________________________________________   LABS (all labs ordered are listed, but only abnormal results are displayed)  Labs Reviewed  BASIC METABOLIC PANEL - Abnormal; Notable for the following components:      Result Value   Glucose, Bld 103 (*)    BUN 39 (*)    Calcium 8.7 (*)    All other components within normal limits  TROPONIN I (HIGH SENSITIVITY) - Abnormal; Notable for the following components:   Troponin I (High Sensitivity) 99 (*)    All other components within normal limits  CBC    Pertinent labs  results that were available during my care of the patient were reviewed by me and considered in my medical decision making (see chart for details). ____________________________________________  EKG  I personally interpreted any EKGs ordered by me or triage Several EKGs were obtained, the first shows no acute ischemic changes but atrial fibrillation rate 138, the second, after he broke, shows sinus rhythm rate 82, normal axis no acute ST  elevation or depression. ____________________________________________  RADIOLOGY  Pertinent labs & imaging results that were available during my care of the patient were reviewed by me and considered in my medical decision making (see chart for details). If possible, patient and/or family made aware of any abnormal findings.  Dg Chest 2 View  Result Date: 09/29/2018 CLINICAL DATA:  Chest pain since this morning, little shortness of breath, history atrial fibrillation, hypertension EXAM: CHEST - 2 VIEW COMPARISON:  01/26/2016 FINDINGS: Normal heart size, mediastinal contours, and pulmonary vascularity. Linear scarring LEFT base. Lungs otherwise clear. No pleural effusion or pneumothorax. Bones unremarkable. IMPRESSION: Linear scarring LEFT base. No acute abnormalities. Electronically Signed   By: Lavonia Dana M.D.   On: 09/29/2018 12:36   ____________________________________________    PROCEDURES  Procedure(s) performed: None  Procedures  Critical Care performed: None  ____________________________________________   INITIAL IMPRESSION / ASSESSMENT AND PLAN / ED COURSE  Pertinent labs & imaging results that were available during my care of the patient were reviewed by me and considered in my medical decision making (see chart for details).   Patient here with paroxysmal atrial fibrillation which he had this morning he is now out of fortunately.  He is on blood thinners.  However, he is also had some exertional symptoms and he had atypical chest pressure diaphoresis and shortness of breath while this was going on.  He feels much better at this time.  He is 68 years old.  He has had some exertional symptoms in the recent past as well.  I think he might benefit from admission and observational stay and we will talk to Dr. Rockey Situ.  That is his cardiologist.  Patient's cardiologist has been paged.  ----------------------------------------- 1:17 PM on  09/29/2018 -----------------------------------------  Had a discussion with Dr. Rockey Situ, we talked about the patient's past medical history, recent health concerns, presenting symptoms blood work EKG and vital signs.  Dr. Rockey Situ voices understanding and states he will come evaluate his patient.  Very much appreciate the consult.  ----------------------------------------- 3:25 PM on 09/29/2018 -----------------------------------------  Patient's troponin is going up, Dr. Rockey Situ and I talked about it.  I did suggest admission, and asked him about a heparin drip.  However, Dr. Rockey Situ did talk to the patient, patient is refusing admission.  Dr. Rockey Situ, his  personal cardiologist, has seen the patient twice.  The first time he did asked me to give the patient a GI cocktail.  The patient was complaining of what he described as gas discomfort.  He does not have chest pain at this time.  Dr. Rockey Situ told the patient that he can go home and they will do a stress test first thing in the morning, depending on the second troponin.  However, the second troponin was elevated.  Dr. Rockey Situ did go talk to the patient again.  Patient has no ongoing symptoms at this time.  I think he is competent to have the risk benefits and alternatives to refusing admission, and he is adamant that he wants to go home.  The risks of course were explained to him including death.  Cardiology feels this is most likely a case of demand ischemia given that he is out of atrial fibrillation and asymptomatic they do not feel that he likely will escalate they do not want me to send a third troponin as I do not think we will change the patient's commitment to going home.  They would like me to discharge him.  Given that patient declines admission and his cardiologist has consulted with him personally, this point we will discharge him at his own request with return precautions and follow-up given and understood.  I have personally talked to the patient  and his cardiologist has as well and this is his election.  This conversation took his primarily between cardiology and the patient & was relayed to me by cardiology but the patient does endorse this to me and I have gone over the customary risk benefits alternative to discharge and he and his wife voiced understanding and want to go.  Risks, as noted do include death.. I appreciate cardiology's personal attention to this issue with the patient and his audiologic condition.    ____________________________________________   FINAL CLINICAL IMPRESSION(S) / ED DIAGNOSES  Final diagnoses:  None      This chart was dictated using voice recognition software.  Despite best efforts to proofread,  errors can occur which can change meaning.      Schuyler Amor, MD 09/29/18 1256    Schuyler Amor, MD 09/29/18 1317    Schuyler Amor, MD 09/29/18 1539    Schuyler Amor, MD 09/29/18 [phone]

## 2018-09-29 NOTE — Telephone Encounter (Signed)
Attempted to call the patient's cell #. No answer- I left a message to please call back.

## 2018-09-29 NOTE — ED Notes (Signed)
Pt given instructions to call 911 if he has any chest pain, shortness of breath, diaphoresis, racing heart, nausea or weakness. Pt verbalizes understanding. Wife given message to call Dr Rockey Situ office to receive instructions for tomorrow. Wife calling now.

## 2018-09-29 NOTE — ED Notes (Signed)
Pt was unhooked from the monitor so he could use the bathroom. Pt was then hooked back up to the monitor and repositioned in bed.

## 2018-09-29 NOTE — ED Triage Notes (Signed)
Pt in via POV, reports onset indigestion, belching after having breakfast this morning.  Pt also reports intermittent diaphoresis.  Pt tachycardic upon arrival, hx of Afibb.  NAD noted at this time.

## 2018-09-29 NOTE — Telephone Encounter (Signed)
-----   Message from Arvil Chaco, PA-C sent at 09/29/2018  3:29 PM EDT ----- Regarding: Current ED patient, Needs outpatient stress tomorrow AM Hello,  This patient was admitted and seen at [facility] ED for chest pain with elevated troponin. He does not wish to be admitted to the hospital but needs a stress test tomorrow morning.  Can you please order / call and arrange / schedule this patient for a Lexiscan (chemical) stress test tomorrow 09/30/2018 with plan for his arrival at 8:45 AM and outpatient stress test to occur at 9:00 AM.    Dr. Rockey Situ has discussed this with the patient and needs the stress test (1) authorized by insurance and (2) scheduled with nuclear med for outpatient stress (8:45AM arrival for 9AM stress) and (3) patient notified of stress arrangements as soon as possible.   Thank you! Signed, Arvil Chaco, PA-C 09/29/2018, 3:30 PM Pager [phone]

## 2018-09-29 NOTE — Telephone Encounter (Signed)
Order placed. High priority message sent to the Pre-Cert pool- Dorian Pod is aware.    Appointment scheduled for 7/30 at 9:00 am.   [facility]  Your caregiver has ordered a Stress Test with nuclear imaging. The purpose of this test is to evaluate the blood supply to your heart muscle. This procedure is referred to as a "Non-Invasive Stress Test." This is because other than having an IV started in your vein, nothing is inserted or "invades" your body. Cardiac stress tests are done to find areas of poor blood flow to the heart by determining the extent of coronary artery disease (CAD). Some patients exercise on a treadmill, which naturally increases the blood flow to your heart, while others who are  unable to walk on a treadmill due to physical limitations have a pharmacologic/chemical stress agent called Lexiscan . This medicine will mimic walking on a treadmill by temporarily increasing your coronary blood flow.   Please note: these test may take anywhere between 2-4 hours to complete  PLEASE REPORT TO Anton TO GO  Date of Procedure: Thursday 09/30/18  Arrival Time for Procedure: 8:45 am  Instructions regarding medication:   __x__ : Dennis Bast may take all of your regular medications the morning or your procedure.  PLEASE NOTIFY THE OFFICE AT LEAST 37 HOURS IN ADVANCE IF YOU ARE UNABLE TO KEEP YOUR APPOINTMENT.  [phone] AND  PLEASE NOTIFY NUCLEAR MEDICINE AT [facility] AT LEAST 24 HOURS IN ADVANCE IF YOU ARE UNABLE TO KEEP YOUR APPOINTMENT. [phone]  How to prepare for your Myoview test:  1. Do not eat or drink after midnight 2. No caffeine for 24 hours prior to test 3. No smoking 24 hours prior to test. 4. Your medication may be taken with water.  If your doctor stopped a medication because of this test, do not take that medication. 5. Ladies, please do not wear dresses.  Skirts or pants are appropriate.  Please wear a short sleeve shirt. 6. No perfume, cologne or lotion. 7. Wear comfortable walking shoes. No heels!

## 2018-09-29 NOTE — ED Notes (Signed)
Cardiologist in with pt. Pt was offered admission or d/c by cardio. Pt states he wants to go home and not be admitted. Pt will be seen by cardio first thing in the morning.

## 2018-09-29 NOTE — ED Triage Notes (Signed)
First RN Note: Pt presents to ED via POV. Pt states "I don't know if I'm having real bad indigestion or what". Pt also c/o "real bad A-fib that's been cutting up".

## 2018-09-29 NOTE — Telephone Encounter (Signed)
I spoke with the patient's wife (ok per DPR). I have advised her of the date/ arrival time of the patient's lexiscan in the morning.  I have advised her of his pre procedure instructions as well and she voices understanding.

## 2018-09-29 NOTE — Consult Note (Signed)
Cardiology Consultation:   Patient ID: Neil Bush MRN: [id number]; DOB: [birth year]  Admit date: 09/29/2018 Date of Consult: 09/29/2018  Primary Care Provider: Guadalupe Maple, MD Primary Cardiologist: No primary care provider on file.  Primary Electrophysiologist:  None    Patient Profile:   Neil Bush is a 68 y.o. male with a hx of paroxysmal atrial fibrillation on Xarelto (01/26/2016), hypertension, hyperlipidemia, atypical chest pain, and who is being seen today for the evaluation of atrial fibrillation with RVR and associated chest pain at the request of Dr. Burlene Arnt.  History of Present Illness:   Neil Bush is a 68 year old male with PMH as above.  He has a history of paroxysmal atrial fibrillation on anticoagulation. He also has a history of palpitations with associated chest discomfort and DOE with these palpitations.    He was admitted with chest pain in 2017 and found to be in new onset paroxysmal atrial fibrillation, converting to sinus rhythm on IV diltiazem.  He was started on Xarelto/ anticoagulation in the setting of a CHA2DS2VASc score of 2 (HTN, agex1).  Follow-up stress test was ruled low risk and follow-up 2017 echo showed normal LV function.  He was seen at clinic in July 2018 with report of palpitations and concern he was back in atrial fibrillation; however, he did not feel prolonged episodes of palpitations or irregular heartbeats.  In the setting, he was placed on a ZIO monitor that showed symptomatic PACs and 3 short runs of SVT/atrial tachycardia with a maximum of 11 beats.  No atrial fibrillation was noted on the monitor.  Short acting diltiazem was added to his medication regimen with the reduction in his palpitations but, when felt, he still described SOB and mild chest discomfort in the setting of palpitations.  Since that time, he reported that he is usually relatively asymptomatic while in atrial fibrillation with palpable irregular radial pulse but  otherwise no real symptoms.  He was started on metoprolol 25mg  daily by his primary cardiologist with diltiazem discontinued. He was also started on lisinopril for elevated BP.   On 09/29/2018, he presented to the [facility] emergency department with complaint of rapid heart rate, concerning for atrial fibrillation rhythm, while eating a sausage biscuit in his truck around 7:30 AM.  He reported associated nonpleuritic, nonradiating chest discomfort, rated 2/10 in severity that lasted until 11:30AM.  Associated symptoms included SOB, diaphoresis, and nausea without emesis. He stated that it was not hot in his truck, given it was so early in the morning.  No other reported aggravating or alleviating factors.  At first, he thought the nausea may be related to indigestion; however, it persisted. He decided to go to the emergency department, because he was concerned for the return of atrial fibrillation with associated chest discomfort and diaphoresis.  He stated that he had never had this combination of symptoms all at the same time before.  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  He reported medication compliance with Xarelto and denied any missed doses.  In the ED, initial EKG showed atrial fibrillation with rapid ventricular rate but without acute ST/T changes.  Subsequent EKG showed conversion back to sinus rhythm with patient maintaining sinus rhythm at time of exam.  ED vitals significant for elevated blood pressure at 140/118. CXR with linear scarring at L base, no acute changes. Initial troponin 99.  Current 2/10 chest pressure reported at time of cardiac consultation.  Heart Pathway Score:     Past Medical History:  Diagnosis Date   Chest pain    a. 01/2016 in setting of Afib;  b. 02/2016 Ex MV: EF 55-65%, no ischemia.   Dysrhythmia    Headache    Hyperlipidemia    Hypertension     PAF (paroxysmal atrial fibrillation) ([place])    a. 01/2016 - admission for rapid AF and c/p-->converted on IV dilt;  b. CHA2DS2VASc = 2-->Xarelto;  c. 01/2016 Echo: EF 55-60%, triv AI.   Palpitations    a. 08/2016 Zio Patch: symptomatic PAC's and brief (max 11 beats) of SVT/PAT (3 episodes).    Past Surgical History:  Procedure Laterality Date   CARDIAC CATHETERIZATION     [facility]    CHOLECYSTECTOMY     COLONOSCOPY WITH PROPOFOL N/A 04/17/2017   Procedure: COLONOSCOPY WITH PROPOFOL;  Surgeon: Lucilla Lame, MD;  Location: [place];  Service: Endoscopy;  Laterality: N/A;   EYE SURGERY     FOOT SURGERY     HERNIA REPAIR     X 2 ingunial   KNEE SURGERY Right    POLYPECTOMY  04/17/2017   Procedure: POLYPECTOMY INTESTINAL;  Surgeon: Lucilla Lame, MD;  Location: [place];  Service: Endoscopy;;     Home Medications:  Prior to Admission medications   Medication Sig Start Date End Date Taking? Authorizing Provider  atorvastatin (LIPITOR) 40 MG tablet Take 1 tablet (40 mg total) by mouth daily at 6 PM. Patient taking differently: Take 20 mg by mouth daily at 6 PM.  03/29/18  Yes Crissman, Jeannette How, MD  lisinopril (PRINIVIL,ZESTRIL) 10 MG tablet Take 1 tablet (10 mg total) by mouth daily. 03/29/18  Yes Crissman, Jeannette How, MD  meloxicam (MOBIC) 15 MG tablet Take 15 mg by mouth Nightly. 09/15/18  Yes [provider]  metoprolol succinate (TOPROL-XL) 50 MG 24 hr tablet Take 1 tablet (50 mg total) by mouth daily. Take with or immediately following a meal. Patient taking differently: Take 25 mg by mouth daily. Take with or immediately following a meal. 03/29/18 09/29/18 Yes Crissman, Jeannette How, MD  rivaroxaban (XARELTO) 20 MG TABS tablet Take 1 tablet (20 mg total) by mouth daily. 09/28/18  Yes Guadalupe Maple, MD    Inpatient Medications: Scheduled Meds:  Continuous Infusions:  PRN Meds:   Allergies:    Allergies  Allergen Reactions   Penicillins Rash    Has  patient had a PCN reaction causing immediate rash, facial/tongue/throat swelling, SOB or lightheadedness with hypotension: no Has patient had a PCN reaction causing severe rash involving mucus membranes or skin necrosis: no Has patient had a PCN reaction that required hospitalization no Has patient had a PCN reaction occurring within the last 10 years: no If all of the above answers are "NO", then may proceed with Cephalosporin use.    Childhood allergy    Social History:   Social History   Socioeconomic History   Marital status: Married    Spouse name: Not on file   Number of children: Not on file   Years of education: Not on file   Highest education level: Not on file  Occupational History   Occupation: partime truck driver  Social Needs   Financial resource strain: Not on file   Food insecurity    Worry: Not on file    Inability: Not on file   Transportation needs    Medical: Not on  file    Non-medical: Not on file  Tobacco Use   Smoking status: Never Smoker   Smokeless tobacco: Never Used  Substance and Sexual Activity   Alcohol use: No    Alcohol/week: 0.0 standard drinks   Drug use: No   Sexual activity: Yes  Lifestyle   Physical activity    Days per week: Not on file    Minutes per session: Not on file   Stress: Not on file  Relationships   Social connections    Talks on phone: Not on file    Gets together: Not on file    Attends religious service: Not on file    Active member of club or organization: Not on file    Attends meetings of clubs or organizations: Not on file    Relationship status: Not on file   Intimate partner violence    Fear of current or ex partner: Not on file    Emotionally abused: Not on file    Physically abused: Not on file    Forced sexual activity: Not on file  Other Topics Concern   Not on file  Social History Narrative   Not on file    Family History:    Family History  Problem Relation Age of Onset     Cancer Mother    Heart disease Father    Diabetes Father    Hypertension Father    Heart disease Brother    COPD Neg Hx    Stroke Neg Hx      ROS:  Please see the history of present illness.  Review of Systems  Respiratory: Positive for shortness of breath. Negative for hemoptysis.   Cardiovascular: Positive for chest pain and palpitations. Negative for leg swelling.  Gastrointestinal: Positive for nausea. Negative for blood in stool and vomiting.  Genitourinary: Negative for hematuria.  All other systems reviewed and are negative.   All other ROS reviewed and negative.     Physical Exam/Data:   Vitals:   09/29/18 1155 09/29/18 1200 09/29/18 1230 09/29/18 1300  BP: 121/83 115/85 108/80 107/81  Pulse: 80 70 66 67  Resp: 17 16 13 12   Temp:      TempSrc:      SpO2: 94% 95% 94% 96%  Weight:      Height:       No intake or output data in the 24 hours ending 09/29/18 1421 Last 3 Weights 09/29/2018 09/28/2018 03/29/2018  Weight (lbs) 182 lb 182 lb 185 lb 14.4 oz  Weight (kg) 82.555 kg 82.555 kg 84.324 kg     Body mass index is 26.11 kg/m.  General:  Well nourished, well developed, in no acute distress HEENT: normal Neck: no JVD Vascular: radial pulses 2+ bilaterally Cardiac: normal S1, S2; regular to bradycardic rate, regular rhythm; no murmur  Lungs:  clear to auscultation bilaterally Abd: soft, nontender, no hepatomegaly  Ext: minimal lower extremity edema Musculoskeletal:  No deformities, BUE and BLE strength normal and equal Skin: warm and dry  Neuro:  No focal abnormalities noted Psych:  Normal affect   EKG:  The EKG was personally reviewed and demonstrates:  Atrial fibrillation with RVR, ventricular rate 138 bpm. Repeat EKG sinus rhythm, 82 bpm, first degree AVB versus baseline artifact. No acute ST/T changes Telemetry:  Telemetry was personally reviewed and demonstrates: IRIR converting to SR with possible  first degree AVB and   bradycardic  rate  Relevant CV Studies: TTE  2017 - Left ventricle: The cavity  size was normal. Wall thickness was   normal. Systolic function was normal. The estimated ejection   fraction was in the range of 55% to 60%. Left ventricular   diastolic function parameters were normal for the patient&'s age. - Aortic valve: There was trivial regurgitation.   Myoview 2017  Blood pressure demonstrated a normal response to exercise.  There was no ST segment deviation noted during stress.  The study is normal.  This is a low risk study.  The left ventricular ejection fraction is normal (55-65%).  Laboratory Data:  High Sensitivity Troponin:   Recent Labs  Lab 09/29/18 1152  TROPONINIHS 99*     Cardiac EnzymesNo results for input(s): TROPONINI in the last 168 hours. No results for input(s): TROPIPOC in the last 168 hours.  Chemistry Recent Labs  Lab 09/29/18 1152  NA 142  K 4.1  CL 111  CO2 22  GLUCOSE 103*  BUN 39*  CREATININE 1.20  CALCIUM 8.7*  GFRNONAA >60  GFRAA >60  ANIONGAP 9    No results for input(s): PROT, ALBUMIN, AST, ALT, ALKPHOS, BILITOT in the last 168 hours. Hematology Recent Labs  Lab 09/29/18 1152  WBC 9.7  RBC 4.91  HGB 15.9  HCT 45.3  MCV 92.3  MCH 32.4  MCHC 35.1  RDW 13.4  PLT 248   BNPNo results for input(s): BNP, PROBNP in the last 168 hours.  DDimer No results for input(s): DDIMER in the last 168 hours.   Radiology/Studies:  Dg Chest 2 View  Result Date: 09/29/2018 CLINICAL DATA:  Chest pain since this morning, little shortness of breath, history atrial fibrillation, hypertension EXAM: CHEST - 2 VIEW COMPARISON:  01/26/2016 FINDINGS: Normal heart size, mediastinal contours, and pulmonary vascularity. Linear scarring LEFT base. Lungs otherwise clear. No pleural effusion or pneumothorax. Bones unremarkable. IMPRESSION: Linear scarring LEFT base. No acute abnormalities. Electronically Signed   By: Lavonia Dana M.D.   On: 09/29/2018 12:36     Assessment and Plan:   Atypical chest pain with elevated troponin - Current 2/6 nonpleuritic and nonradiating chest pain/pressure.  Reported onset of chest pain when felt atrial fibrillation around 7:30 AM this morning /while eating a sausage biscuit.  Associated symptoms included diaphoresis and shortness of breath.  Of note, patient does have a past history of atypical chest pain reported with palpitations; however, new diaphoresis with SOB and recent exertional sx are new for him. --Initial EKG significant for atrial fibrillation with RVR.  Repeat EKG showed normal sinus rhythm with possible first-degree AV block.  EKG without acute ST/T changes.  Initial HS troponin 99 with repeat high-sensitivity troponin pending. --Pending repeat elevated troponin, suspect troponin elevation in the setting of elevated ventricular rate.  If significant bump in high-sensitivity troponin or progressive chest discomfort, recommend further ischemic work-up.  Consider also updating 2017 echocardiogram to assess for atrial fibrillation burden and with recommendation for further ischemic work-up if reduced EF or acute structural changes present on repeat echo. No current plan for cardiac cath with troponin pending.  Atrial fibrillation with rapid ventricular rate - Maintaining sinus rhythm as above. As above, repeat echo or stress now or outpatient.  --Continue BB for rate control. Hold with further bradycardia or hypotension to avoid prerenal AKI. - Continue Xarelto 20 mg daily with CHA2DS2VASc score of at least  2 (HTN, age).   For questions or updates, please contact [place] Please consult [email or web address] for contact info under     Signed, Arvil Chaco, PA-C  09/29/2018  2:21 PM

## 2018-09-29 NOTE — ED Notes (Signed)
Date and time results received: 09/29/18   Test: troponin Critical Value: 219  Name of Provider Notified: Mcshane  Orders Received? Or Actions Taken?: MD notified

## 2018-09-29 NOTE — Discharge Instructions (Signed)
We have advised that you stay in the hospital overnight but you would prefer to go home.  This is certainly your choice but it does limit our ability to watch you and prevent or intercede in the event of any significant worsening of your condition which could include chest pain heart dysrhythmias or even death.  However, you feel much better and you have declined admission, which is certainly your choice but if you feel worse in any way, we encourage you to come back this would include chest pain shortness of breath palpitations lightheadedness or any other concerns.  Continue taking your Xarelto.  Please see your cardiologist, Dr. Rockey Situ, first thing tomorrow morning as you have discussed with him.

## 2018-09-29 NOTE — Telephone Encounter (Signed)
Just attempted the patient's cell # again. No answer.

## 2018-09-29 NOTE — Telephone Encounter (Signed)
I attempted to call the patient's home #. No answer- no voice mail.  I sent Jacquelyn a message and she is not in the ER with the patient.  I called the ER and spoke with the charge nurse & the patient's nurse. [phone].  I was told by both nurses they could not assist me with getting me connected with the patient at this time as they are both tied up.  The patient's nurse states she will have the patient call us back.

## 2018-09-30 ENCOUNTER — Ambulatory Visit
Admission: RE | Admit: 2018-09-30 | Discharge: 2018-09-30 | Disposition: A | Discharge status: Home / Self Care | Payer: PPO | Source: Ambulatory Visit | Attending: Cardiovascular Disease | Admitting: Cardiovascular Disease

## 2018-09-30 DIAGNOSIS — R079 Chest pain, unspecified: Secondary | ICD-10-CM | POA: Insufficient documentation

## 2018-09-30 LAB — NM MYOCAR MULTI W/SPECT W/WALL MOTION / EF
Estimated workload: 1 METS
Exercise duration (min): 0 min
Exercise duration (sec): 0 s
LV dias vol: 100 mL (ref 62–150)
LV sys vol: 32 mL
MPHR: 150 {beats}/min
Peak HR: 90 {beats}/min
Percent HR: 60 %
Rest HR: 60 {beats}/min
SDS: 0
SRS: 4
SSS: 0
TID: 1.03

## 2018-09-30 MED ORDER — REGADENOSON 0.4 MG/5ML IV SOLN
0.4000 mg | Freq: Once | INTRAVENOUS | Status: AC
  Administered 2018-09-30: 0.4 mg via INTRAVENOUS
  Filled 2018-09-30: qty 5

## 2018-09-30 MED ORDER — TECHNETIUM TC 99M TETROFOSMIN IV KIT
31.0010 | PACK | Freq: Once | INTRAVENOUS | Status: AC | PRN
  Administered 2018-09-30: 31.001 via INTRAVENOUS

## 2018-09-30 MED ORDER — TECHNETIUM TC 99M TETROFOSMIN IV KIT
10.0000 | PACK | Freq: Once | INTRAVENOUS | Status: AC | PRN
  Administered 2018-09-30: 10.62 via INTRAVENOUS

## 2018-10-01 ENCOUNTER — Telehealth: Payer: Self-pay | Admitting: Cardiovascular Disease

## 2018-10-01 NOTE — Telephone Encounter (Signed)
Attempted to call the patient.  I left a message to please call back on his cell #.  Home #- no answer/ no voice mail.

## 2018-10-01 NOTE — Telephone Encounter (Signed)
I spoke with the patient regarding his results. He voices understanding.

## 2018-10-01 NOTE — Telephone Encounter (Signed)
Notes recorded by Neil Merritts, MD on 09/30/2018 at 9:57 PM EDT  Normal stress test

## 2018-10-15 ENCOUNTER — Other Ambulatory Visit: Payer: Self-pay

## 2018-10-15 ENCOUNTER — Other Ambulatory Visit: Payer: PPO

## 2018-10-15 DIAGNOSIS — I480 Paroxysmal atrial fibrillation: Secondary | ICD-10-CM

## 2018-10-15 DIAGNOSIS — I10 Essential (primary) hypertension: Secondary | ICD-10-CM

## 2018-10-15 DIAGNOSIS — E782 Mixed hyperlipidemia: Secondary | ICD-10-CM | POA: Diagnosis not present

## 2018-10-15 LAB — LP+ALT+AST PICCOLO, WAIVED
ALT (SGPT) Piccolo, Waived: 24 U/L (ref 10–47)
AST (SGOT) Piccolo, Waived: 22 U/L (ref 11–38)
Chol/HDL Ratio Piccolo,Waive: 2.9 mg/dL
Cholesterol Piccolo, Waived: 135 mg/dL (ref <200)
HDL Chol Piccolo, Waived: 47 mg/dL — ABNORMAL LOW (ref >59)
LDL Chol Calc Piccolo Waived: 64 mg/dL (ref <100)
Triglycerides Piccolo,Waived: 118 mg/dL (ref <150)
VLDL Chol Calc Piccolo,Waive: 24 mg/dL (ref <30)

## 2018-10-16 LAB — CBC WITH DIFFERENTIAL/PLATELET
Basophils Absolute: 0.1 10*3/uL (ref 0.0–0.2)
Basos: 1 %
EOS (ABSOLUTE): 0.1 10*3/uL (ref 0.0–0.4)
Eos: 2 %
Hematocrit: 44.6 % (ref 37.5–51.0)
Hemoglobin: 15.4 g/dL (ref 13.0–17.7)
Immature Grans (Abs): 0 10*3/uL (ref 0.0–0.1)
Immature Granulocytes: 1 %
Lymphocytes Absolute: 1.5 10*3/uL (ref 0.7–3.1)
Lymphs: 25 %
MCH: 32.7 pg (ref 26.6–33.0)
MCHC: 34.5 g/dL (ref 31.5–35.7)
MCV: 95 fL (ref 79–97)
Monocytes Absolute: 0.5 10*3/uL (ref 0.1–0.9)
Monocytes: 8 %
Neutrophils Absolute: 4 10*3/uL (ref 1.4–7.0)
Neutrophils: 63 %
Platelets: 203 10*3/uL (ref 150–450)
RBC: 4.71 x10E6/uL (ref 4.14–5.80)
RDW: 13.5 % (ref 11.6–15.4)
WBC: 6.2 10*3/uL (ref 3.4–10.8)

## 2018-10-16 LAB — BASIC METABOLIC PANEL
BUN/Creatinine Ratio: 22 (ref 10–24)
BUN: 23 mg/dL (ref 8–27)
CO2: 23 mmol/L (ref 20–29)
Calcium: 8.7 mg/dL (ref 8.6–10.2)
Chloride: 104 mmol/L (ref 96–106)
Creatinine, Ser: 1.05 mg/dL (ref 0.76–1.27)
GFR calc Af Amer: 83 mL/min/{1.73_m2} (ref >59)
GFR calc non Af Amer: 72 mL/min/{1.73_m2} (ref >59)
Glucose: 87 mg/dL (ref 65–99)
Potassium: 4.4 mmol/L (ref 3.5–5.2)
Sodium: 142 mmol/L (ref 134–144)

## 2018-11-22 NOTE — Progress Notes (Signed)
Cardiology Office Note  Date:  11/23/2018   ID:  Neil Bush, DOB 04-Dec-1950, MRN [id number]  PCP:  Guadalupe Maple, MD   Chief Complaint  Patient presents with  . other    Follow up [facility] ER; A-Fib. Meds reviewed by the pt. verbally. "doing well."     HPI:  68 year old male with a prior history of  paroxysmal atrial fibrillation, 01/26/2016 CHA2DS2VASc score of 2 (HTN, agex1).  hypertension,  hyperlipidemia,  chest pain  who presents for follow-up of his paroxysmal atrial fibrillation  Last seen in clinic 01/2018 Seen in the hospital end of July 2020 emergency room September 29, 2018 for chest pain, atrial fibrillation Reports that he was sitting in his truck getting ready to go to work when he developed tightness in his chest.  Felt his pulse, appreciated it was rapid and irregular Symptoms started approximately 6:30 AM and persisted Noted to be in atrial fibrillation with RVR rate 140 bpm He presented to the emergency room, converted to normal sinus rhythm around 11:30 AM tnt 200 Converted 2 normal sinus rhythm after 5 hours in the emergency room  EKG from the  emergency room showing confirming atrial fibrillation ventricular rate 140 bpm Stress test September 30, 2018 No ischemia low risk study  Since then he reports that he has been doing well, no further atrial fibrillation Did not remember the last time he had an episode prior to going to the emergency room Happens very rarely and typically very short  Typically blood pressure well controlled at home  Denies any significant chest pain concerning for angina since leaving the hospital Tolerating Xarelto  Works on the farm takes care of horses  EKG personally reviewed by myself on todays visit Shows normal sinus rhythm rate 60 bpm no significant ST or T wave changes  Other past medical history reviewed November 2017 he was admitted with chest pain and A. fib and converted to sinus rhythm on IV diltiazem.  Started on  Xarelto in the setting of a CHA2DS2VASc of 2.   stress testing which did not show ischemia.  December 2017 Echo showed normal LV function.   event monitor which showed symptomatic PACs and 3 short runs of SVT/atrial tachycardia with a maximum of 11 beats.  He did not have any atrial fibrillation on this monitor.     PMH:   has a past medical history of Chest pain, Dysrhythmia, Headache, Hyperlipidemia, Hypertension, PAF (paroxysmal atrial fibrillation) ([place]), and Palpitations.  PSH:    Past Surgical History:  Procedure Laterality Date  . CARDIAC CATHETERIZATION     [facility]   . CHOLECYSTECTOMY    . COLONOSCOPY WITH PROPOFOL N/A 04/17/2017   Procedure: COLONOSCOPY WITH PROPOFOL;  Surgeon: Lucilla Lame, MD;  Location: [place];  Service: Endoscopy;  Laterality: N/A;  . EYE SURGERY    . FOOT SURGERY    . HERNIA REPAIR     X 2 ingunial  . KNEE SURGERY Right   . POLYPECTOMY  04/17/2017   Procedure: POLYPECTOMY INTESTINAL;  Surgeon: Lucilla Lame, MD;  Location: [place];  Service: Endoscopy;;    Current Outpatient Medications  Medication Sig Dispense Refill  . atorvastatin (LIPITOR) 40 MG tablet Take 1 tablet (40 mg total) by mouth daily at 6 PM. (Patient taking differently: Take 20 mg by mouth daily at 6 PM. ) 90 tablet 4  . lisinopril (PRINIVIL,ZESTRIL) 10 MG tablet Take 1 tablet (10 mg total) by mouth daily. 90 tablet 4  .  meloxicam (MOBIC) 15 MG tablet Take 15 mg by mouth Nightly.    . metoprolol succinate (TOPROL-XL) 50 MG 24 hr tablet Take 1 tablet (50 mg total) by mouth daily. Take with or immediately following a meal. (Patient taking differently: Take 25 mg by mouth daily. Take with or immediately following a meal.) 90 tablet 4  . rivaroxaban (XARELTO) 20 MG TABS tablet Take 1 tablet (20 mg total) by mouth daily. 90 tablet 2   No current facility-administered medications for this visit.      Allergies:   Penicillins   Social History:  The patient  reports  that he has never smoked. He has never used smokeless tobacco. He reports that he does not drink alcohol or use drugs.   Family History:   family history includes Cancer in his mother; Diabetes in his father; Heart disease in his brother and father; Hypertension in his father.    Review of Systems: Review of Systems  Constitutional: Negative.   Respiratory: Negative.   Cardiovascular: Positive for palpitations.  Gastrointestinal: Negative.   Musculoskeletal: Negative.   Neurological: Negative.   Psychiatric/Behavioral: Negative.   All other systems reviewed and are negative.    PHYSICAL EXAM: VS:  BP 132/80 (BP Location: Left Arm, Patient Position: Sitting, Cuff Size: Normal)   Pulse 60   Temp 97.8 F (36.6 C)   Ht 5\' 10"  (1.778 m)   Wt 181 lb (82.1 kg)   BMI 25.97 kg/m  , BMI Body mass index is 25.97 kg/m. Constitutional:  oriented to person, place, and time. No distress.  HENT:  Head: Grossly normal Eyes:  no discharge. No scleral icterus.  Neck: No JVD, no carotid bruits  Cardiovascular: Regular rate and rhythm, no murmurs appreciated Pulmonary/Chest: Clear to auscultation bilaterally, no wheezes or rails Abdominal: Soft.  no distension.  no tenderness.  Musculoskeletal: Normal range of motion Neurological:  normal muscle tone. Coordination normal. No atrophy Skin: Skin warm and dry Psychiatric: normal affect, pleasant   Recent Labs: 03/29/2018: TSH 1.740 10/15/2018: ALT (SGPT) Piccolo, Waived 24; BUN 23; Creatinine, Ser 1.05; Hemoglobin 15.4; Platelets 203; Potassium 4.4; Sodium 142    Lipid Panel Lab Results  Component Value Date   CHOL 135 10/15/2018   HDL 48 03/29/2018   LDLCALC 60 03/29/2018   TRIG 118 10/15/2018      Wt Readings from Last 3 Encounters:  11/23/18 181 lb (82.1 kg)  09/29/18 182 lb (82.6 kg)  09/28/18 182 lb (82.6 kg)       ASSESSMENT AND PLAN:  Paroxysmal atrial fibrillation (HCC) - Continue Xarelto 20 mg daily,  metoprolol  succinate 50 mg p.o. Daily Denies significant tachycardia concerning for atrial fibrillation We did discuss antiarrhythmic medication if he has more frequent episodes Last episode prior to most recent hospitalization was 2-1/2 years ago  Essential hypertension Continue metoprolol lisinopril  Mixed hyperlipidemia On Lipitor Numbers at goal  Chest pain, unspecified type No further chest pain Previous stress test with no ischemia  Disposition:   F/U  12 months  Hospital records reviewed in detail including echocardiogram, stress test Discussed antiarrhythmic medication with him  Total encounter time more than 25 minutes  Greater than 50% was spent in counseling and coordination of care with the patient    No orders of the defined types were placed in this encounter.    Signed, Esmond Plants, M.D., Ph.D. 11/23/2018  [place], [place]

## 2018-11-23 ENCOUNTER — Encounter: Payer: Self-pay | Admitting: Cardiovascular Disease

## 2018-11-23 ENCOUNTER — Ambulatory Visit (INDEPENDENT_AMBULATORY_CARE_PROVIDER_SITE_OTHER): Payer: PPO | Admitting: Cardiovascular Disease

## 2018-11-23 ENCOUNTER — Other Ambulatory Visit: Payer: Self-pay

## 2018-11-23 VITALS — BP 132/80 | HR 60 | Temp 97.8°F | Ht 70.0 in | Wt 181.0 lb

## 2018-11-23 DIAGNOSIS — I351 Nonrheumatic aortic (valve) insufficiency: Secondary | ICD-10-CM | POA: Diagnosis not present

## 2018-11-23 DIAGNOSIS — E782 Mixed hyperlipidemia: Secondary | ICD-10-CM | POA: Diagnosis not present

## 2018-11-23 DIAGNOSIS — R079 Chest pain, unspecified: Secondary | ICD-10-CM | POA: Diagnosis not present

## 2018-11-23 DIAGNOSIS — I480 Paroxysmal atrial fibrillation: Secondary | ICD-10-CM | POA: Diagnosis not present

## 2018-11-23 DIAGNOSIS — I10 Essential (primary) hypertension: Secondary | ICD-10-CM | POA: Diagnosis not present

## 2018-11-23 NOTE — Patient Instructions (Signed)

## 2018-12-16 DIAGNOSIS — Z9889 Other specified postprocedural states: Secondary | ICD-10-CM | POA: Diagnosis not present

## 2018-12-16 DIAGNOSIS — M1712 Unilateral primary osteoarthritis, left knee: Secondary | ICD-10-CM | POA: Diagnosis not present

## 2018-12-31 ENCOUNTER — Other Ambulatory Visit: Payer: Self-pay

## 2018-12-31 ENCOUNTER — Emergency Department
Admission: EM | Admit: 2018-12-31 | Discharge: 2018-12-31 | Disposition: A | Discharge status: Home / Self Care | Payer: PPO | Attending: Emergency Medicine | Admitting: Emergency Medicine

## 2018-12-31 ENCOUNTER — Ambulatory Visit: Payer: Self-pay | Admitting: *Deleted

## 2018-12-31 DIAGNOSIS — I10 Essential (primary) hypertension: Secondary | ICD-10-CM | POA: Insufficient documentation

## 2018-12-31 DIAGNOSIS — D122 Benign neoplasm of ascending colon: Secondary | ICD-10-CM | POA: Insufficient documentation

## 2018-12-31 DIAGNOSIS — R5381 Other malaise: Secondary | ICD-10-CM | POA: Diagnosis not present

## 2018-12-31 DIAGNOSIS — R519 Headache, unspecified: Secondary | ICD-10-CM | POA: Diagnosis not present

## 2018-12-31 DIAGNOSIS — D123 Benign neoplasm of transverse colon: Secondary | ICD-10-CM | POA: Insufficient documentation

## 2018-12-31 DIAGNOSIS — M25562 Pain in left knee: Secondary | ICD-10-CM | POA: Diagnosis not present

## 2018-12-31 DIAGNOSIS — R001 Bradycardia, unspecified: Secondary | ICD-10-CM | POA: Diagnosis not present

## 2018-12-31 LAB — BASIC METABOLIC PANEL
Anion gap: 9 (ref 5–15)
BUN: 27 mg/dL — ABNORMAL HIGH (ref 8–23)
CO2: 25 mmol/L (ref 22–32)
Calcium: 9.2 mg/dL (ref 8.9–10.3)
Chloride: 107 mmol/L (ref 98–111)
Creatinine, Ser: 1.1 mg/dL (ref 0.61–1.24)
GFR calc Af Amer: >60 mL/min (ref >60)
GFR calc non Af Amer: >60 mL/min (ref >60)
Glucose, Bld: 104 mg/dL — ABNORMAL HIGH (ref 70–99)
Potassium: 4 mmol/L (ref 3.5–5.1)
Sodium: 141 mmol/L (ref 135–145)

## 2018-12-31 LAB — TROPONIN I (HIGH SENSITIVITY): Troponin I (High Sensitivity): 6 ng/L (ref <18)

## 2018-12-31 LAB — CBC
HCT: 46.9 % (ref 39.0–52.0)
Hemoglobin: 16.3 g/dL (ref 13.0–17.0)
MCH: 32.5 pg (ref 26.0–34.0)
MCHC: 34.8 g/dL (ref 30.0–36.0)
MCV: 93.6 fL (ref 80.0–100.0)
Platelets: 222 10*3/uL (ref 150–400)
RBC: 5.01 MIL/uL (ref 4.22–5.81)
RDW: 12.8 % (ref 11.5–15.5)
WBC: 8.6 10*3/uL (ref 4.0–10.5)
nRBC: 0 % (ref 0.0–0.2)

## 2018-12-31 MED ORDER — LISINOPRIL 20 MG PO TABS: 10.0000 mg | ORAL_TABLET | Freq: Every day | ORAL | 1 refills | Status: DC

## 2018-12-31 MED ORDER — SODIUM CHLORIDE 0.9% FLUSH: 3.0000 mL | Freq: Once | INTRAVENOUS | Status: DC

## 2018-12-31 MED ORDER — SODIUM CHLORIDE 0.9 % IV SOLN
Freq: Once | INTRAVENOUS | Status: AC
  Administered 2018-12-31: 12:00:00 via INTRAVENOUS

## 2018-12-31 MED ORDER — LISINOPRIL 20 MG PO TABS: 20.0000 mg | ORAL_TABLET | Freq: Every day | ORAL | 11 refills | Status: DC

## 2018-12-31 NOTE — ED Notes (Signed)

## 2018-12-31 NOTE — ED Provider Notes (Signed)
[facility] Emergency Department Provider Note       Time seen: ----------------------------------------- 11:56 AM on 12/31/2018 -----------------------------------------   I have reviewed the triage vital signs and the nursing notes.  HISTORY   Chief Complaint Hypertension    HPI Neil Bush is a 68 y.o. male with a history of hyperlipidemia, hypertension, atrial fibrillation, palpitations who presents to the ED for high blood pressure for the past week with intermittent not feeling well and headache.  He arrives alert and oriented, has been having some left knee pain but otherwise denies complaints.  Past Medical History:  Diagnosis Date  . Chest pain    a. 01/2016 in setting of Afib;  b. 02/2016 Ex MV: EF 55-65%, no ischemia.  Marland Kitchen Dysrhythmia   . Headache   . Hyperlipidemia   . Hypertension   . PAF (paroxysmal atrial fibrillation) ([place])    a. 01/2016 - admission for rapid AF and c/p-->converted on IV dilt;  b. CHA2DS2VASc = 2-->Xarelto;  c. 01/2016 Echo: EF 55-60%, triv AI.  Marland Kitchen Palpitations    a. 08/2016 Zio Patch: symptomatic PAC's and brief (max 11 beats) of SVT/PAT (3 episodes).    Patient Active Problem List   Diagnosis Date Noted  . Advanced care planning/counseling discussion 03/29/2018  . Personal history of colonic polyps   . Polyp of sigmoid colon   . Benign neoplasm of ascending colon   . Benign neoplasm of transverse colon   . Nonrheumatic aortic valve insufficiency   . Chest pain 01/26/2016  . Paroxysmal atrial fibrillation ([place]) 01/26/2016  . Near syncope 11/20/2015  . 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 BPH (benign prostatic hyperplasia) 12/26/2014  . Hyperlipidemia   . Essential hypertension     Past Surgical History:  Procedure Laterality Date  . CARDIAC CATHETERIZATION     [facility]   . CHOLECYSTECTOMY    . COLONOSCOPY WITH PROPOFOL N/A 04/17/2017   Procedure: COLONOSCOPY WITH PROPOFOL;  Surgeon: Lucilla Lame, MD;  Location: [facility];  Service: Endoscopy;  Laterality: N/A;  . EYE SURGERY    . FOOT SURGERY    . HERNIA REPAIR     X 2 ingunial  . KNEE SURGERY Right   . POLYPECTOMY  04/17/2017   Procedure: POLYPECTOMY INTESTINAL;  Surgeon: Lucilla Lame, MD;  Location: [facility];  Service: Endoscopy;;    Allergies Penicillins  Social History Social History   Tobacco Use  . Smoking status: Never Smoker  . Smokeless tobacco: Never Used  Substance Use Topics  . Alcohol use: No    Alcohol/week: 0.0 standard drinks  . Drug use: No    Review of Systems Constitutional: Negative for fever. Cardiovascular: Negative for chest pain. Respiratory: Negative for shortness of breath. Gastrointestinal: Negative for abdominal pain, vomiting and diarrhea. Musculoskeletal: Negative for back pain. Skin: Negative for rash. Neurological: Negative for headaches, focal weakness or numbness.  All systems negative/normal/unremarkable except as stated in the HPI  ____________________________________________   PHYSICAL EXAM:  VITAL SIGNS: ED Triage Vitals  Enc Vitals Group     BP 12/31/18 1045 (!) 188/96     Pulse Rate 12/31/18 1045 65     Resp 12/31/18 1045 17     Temp 12/31/18 1045 98.5 F (36.9 C)     Temp Source 12/31/18 1045 Oral     SpO2 12/31/18 1045 97 %     Weight 12/31/18 1046 180 lb (81.6 kg)     Height 12/31/18 1046 5\' 10"  (1.778 m)  Head Circumference --      Peak Flow --      Pain Score 12/31/18 1045 0     Pain Loc --      Pain Edu? --      Excl. in [place]? --    Constitutional: Alert and oriented. Well appearing and in no distress. Eyes: Conjunctivae are normal. Normal extraocular movements. Cardiovascular: Normal rate, regular rhythm. No murmurs, rubs, or gallops. Respiratory: Normal respiratory effort without tachypnea nor retractions. Breath sounds are clear and equal bilaterally. No wheezes/rales/rhonchi. Gastrointestinal: Soft and nontender. Normal bowel  sounds Musculoskeletal: Nontender with normal range of motion in extremities. No lower extremity tenderness nor edema. Neurologic:  Normal speech and language. No gross focal neurologic deficits are appreciated.  Skin:  Skin is warm, dry and intact. No rash noted. Psychiatric: Mood and affect are normal. Speech and behavior are normal.  ____________________________________________  EKG: Interpreted by me.  Sinus bradycardia with a rate of 59 bpm, normal PR interval, normal QRS, normal QT  ____________________________________________  ED COURSE:  As part of my medical decision making, I reviewed the following data within the [place] History obtained from family if available, nursing notes, old chart and ekg, as well as notes from prior ED visits. Patient presented for high blood pressure, we will assess with labs and imaging as indicated at this time.   Procedures  Neil Bush was evaluated in Emergency Department on 12/31/2018 for the symptoms described in the history of present illness. He was evaluated in the context of the global COVID-19 pandemic, which necessitated consideration that the patient might be at risk for infection with the SARS-CoV-2 virus that causes COVID-19. Institutional protocols and algorithms that pertain to the evaluation of patients at risk for COVID-19 are in a state of rapid change based on information released by regulatory bodies including the CDC and federal and state organizations. These policies and algorithms were followed during the patient's care in the ED.  ____________________________________________   LABS (pertinent positives/negatives)  Labs Reviewed  BASIC METABOLIC PANEL - Abnormal; Notable for the following components:      Result Value   Glucose, Bld 104 (*)    BUN 27 (*)    All other components within normal limits  CBC  TROPONIN I (HIGH SENSITIVITY)  ____________________________________________   DIFFERENTIAL  DIAGNOSIS   Hypertension, renal insufficiency, anxiety, pain,  FINAL ASSESSMENT AND PLAN  Hypertension   Plan: The patient had presented for hypertension and general ill feeling. Patient's labs are unremarkable.  Patient has not had any neurologic symptoms on examination.  Blood pressure has decreased by 40 points without any treatment although he did get IV fluids.  I will increase his lisinopril but otherwise he is cleared for outpatient follow-up.   Laurence Aly, MD    Note: This note was generated in part or whole with voice recognition software. Voice recognition is usually quite accurate but there are transcription errors that can and very often do occur. I apologize for any typographical errors that were not detected and corrected.     Earleen Newport, MD 12/31/18 [phone]

## 2018-12-31 NOTE — Telephone Encounter (Signed)
  Pt's wife Neil Bush called in concerned that since Tuesday he has been acting sluggish and not like himself.   His BP has been elevated:  170/97 Tuesday, 154/88 yesterday and 158/93 while I was on the phone with her.  She informed me she gave him one of her BP pills yesterday to see if it would help get his BP down.   See below for name and dose I did education with her not to give him her medications to please call the office or go to the hospital before doing that.    She verbalized understanding and said,   "I was just trying to get his BP down".     I referred him to the ED however Neil Bush asked that I please check and see if Dr. Jeananne Rama would see him instead.   I called into the office and they also want him to go to the ED.    Dr. Jeananne Rama is not in today and with his symptoms he needs to go.    I let Neil Bush know he needs to go to the ED.   His issues would be better taken care of in the ED than in the office.  Neil Bush agreed and said she would take him to [facility].   She thanked me for my help.  I sent my notes to Dr. Rance Muir office.    Reason for Disposition . Headache  (and neurologic deficit)  Answer Assessment - Initial Assessment Questions 1. SYMPTOM: "What is the main symptom you are concerned about?" (e.g., weakness, numbness)     Neil Bush wife calling in.    170/97 3 days ago, 152/94 now.   He is sluggish now.   I gave him one of my BP medications to see if it would help. Tuesday he came in for supper and I asked him what he had for lunch and he didn't know.   He hasn't been right since.  Having shortness of breath and a dull headache.     He takes lisinopril 10 mg for his BP.   I gave him one of my BP pills yesterday Benazepril 20 mg.     2. ONSET: "When did this start?" (minutes, hours, days; while sleeping)     Tuesday.    I've been trying for 4 days to get his BP down. 3. LAST NORMAL: "When was the last time you were normal (no  symptoms)?"     Tuesday 4. PATTERN "Does this come and go, or has it been constant since it started?"  "Is it present now?"     It comes and goes 5. CARDIAC SYMPTOMS: "Have you had any of the following symptoms: chest pain, difficulty breathing, palpitations?"     He has A. Fib.   His EKG shows he is "OK".   6. NEUROLOGIC SYMPTOMS: "Have you had any of the following symptoms: headache, dizziness, vision loss, double vision, changes in speech, unsteady on your feet?"     Dull headache, sluggish and tired, not as alert as normal, no dizziness, no balance issues..   He got dizzy last week so he held his nose and blew to open his ears which helped the dizziness. 7. OTHER SYMPTOMS: "Do you have any other symptoms?"     See above. 8. PREGNANCY: "Is there any chance you are pregnant?" "When was your last menstrual period?"     N/A  Protocols used: NEUROLOGIC DEFICIT-A-AH

## 2018-12-31 NOTE — ED Triage Notes (Signed)
Pt c/o increased B/p for the past week with intermittent not feeling well with a HA. Pt is a/ox4 on arrival.

## 2018-12-31 NOTE — Telephone Encounter (Signed)
Noted, agree with need for ER visit on this patient.

## 2018-12-31 NOTE — ED Notes (Signed)
Unhooked pt from monitor to use the bathroom

## 2019-01-05 DIAGNOSIS — C44519 Basal cell carcinoma of skin of other part of trunk: Secondary | ICD-10-CM | POA: Diagnosis not present

## 2019-01-14 ENCOUNTER — Encounter: Payer: Self-pay | Admitting: Family Medicine

## 2019-01-14 ENCOUNTER — Other Ambulatory Visit: Payer: Self-pay

## 2019-01-14 ENCOUNTER — Ambulatory Visit (INDEPENDENT_AMBULATORY_CARE_PROVIDER_SITE_OTHER): Payer: PPO | Admitting: Family Medicine

## 2019-01-14 VITALS — BP 167/84 | HR 58 | Temp 98.2°F

## 2019-01-14 DIAGNOSIS — M1712 Unilateral primary osteoarthritis, left knee: Secondary | ICD-10-CM

## 2019-01-14 DIAGNOSIS — I10 Essential (primary) hypertension: Secondary | ICD-10-CM | POA: Diagnosis not present

## 2019-01-14 MED ORDER — LISINOPRIL 40 MG PO TABS: 40.0000 mg | ORAL_TABLET | Freq: Every day | ORAL | 0 refills | Status: DC

## 2019-01-14 MED ORDER — DICLOFENAC SODIUM 1 % EX GEL: 2.0000 g | Freq: Four times a day (QID) | CUTANEOUS | 2 refills | Status: DC

## 2019-01-14 NOTE — Assessment & Plan Note (Signed)
Will d/c meloxicam at bedtime and start voltaren gel as needed throughout the day, particularly given his elevated BPs and daily xarelto feel this is a safer option for him. Continue working with Orthopedics on long term management

## 2019-01-14 NOTE — Progress Notes (Signed)
BP (!) 167/84 (BP Location: Left Arm, Cuff Size: Normal)   Pulse (!) 58   Temp 98.2 F (36.8 C) (Oral)   SpO2 97%    Subjective:    Patient ID: Neil Bush, male    DOB: 20-Nov-1950, 68 y.o.   MRN: [id number]  HPI: Neil Bush is a 68 y.o. male  Chief Complaint  Patient presents with  . ER Follow Up    went to ER for elevated BP, states he was told to take 2 lisinopril daily at the hospital    Here today to discuss some elevated BPs. Went to ER about 2 weeks ago for HAs and elevated BPs, was told to start taking double his 10 mg lisinopril dose. Has started this daily and his home readings are running 130s-160s/90s. Tolerating the dose increase well without issues. Denies CP, HAs, SOB, dizziness. Denies any new stressors, worsened pain sxs, diet changes, etc.   Taking 2 tylenol for his knee every morning and meloxicam at bedtime, notes this doesn't really seem to benefit him much. Working with orthopedics for his severe OA of the left knee, arthroscopy last year and numerous steroid injections since. 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 Feels like he's headed toward a knee replacement soon.   Relevant past medical, surgical, family and social history reviewed and updated as indicated. Interim medical history since our last visit reviewed. Allergies and medications reviewed and updated.  Review of Systems  Per HPI unless specifically indicated above     Objective:    BP (!) 167/84 (BP Location: Left Arm, Cuff Size: Normal)   Pulse (!) 58   Temp 98.2 F (36.8 C) (Oral)   SpO2 97%   Wt Readings from Last 3 Encounters:  12/31/18 180 lb (81.6 kg)  11/23/18 181 lb (82.1 kg)  09/29/18 182 lb (82.6 kg)    Physical Exam Vitals signs and nursing note reviewed.  Constitutional:      Appearance: Normal appearance.  HENT:     Head: Atraumatic.  Eyes:     Extraocular Movements:  Extraocular movements intact.     Conjunctiva/sclera: Conjunctivae normal.  Neck:     Musculoskeletal: Normal range of motion and neck supple.  Cardiovascular:     Rate and Rhythm: Normal rate and regular rhythm.  Pulmonary:     Effort: Pulmonary effort is normal.     Breath sounds: Normal breath sounds.  Musculoskeletal: Normal range of motion.        General: Tenderness (left knee) present.  Skin:    General: Skin is warm and dry.  Neurological:     General: No focal deficit present.     Mental Status: He is oriented to person, place, and time.  Psychiatric:        Mood and Affect: Mood normal.        Thought Content: Thought content normal.        Judgment: Judgment normal.     Results for orders placed or performed during the hospital encounter of [id number]  Basic metabolic panel  Result Value Ref Range   Sodium 141 135 - 145 mmol/L   Potassium 4.0 3.5 - 5.1 mmol/L   Chloride 107 98 - 111 mmol/L   CO2 25 22 - 32 mmol/L   Glucose, Bld 104 (H) 70 - 99 mg/dL   BUN 27 (H) 8 - 23  mg/dL   Creatinine, Ser 1.10 0.61 - 1.24 mg/dL   Calcium 9.2 8.9 - 10.3 mg/dL   GFR calc non Af Amer >60 >60 mL/min   GFR calc Af Amer >60 >60 mL/min   Anion gap 9 5 - 15  CBC  Result Value Ref Range   WBC 8.6 4.0 - 10.5 K/uL   RBC 5.01 4.22 - 5.81 MIL/uL   Hemoglobin 16.3 13.0 - 17.0 g/dL   HCT 46.9 39.0 - 52.0 %   MCV 93.6 80.0 - 100.0 fL   MCH 32.5 26.0 - 34.0 pg   MCHC 34.8 30.0 - 36.0 g/dL   RDW 12.8 11.5 - 15.5 %   Platelets 222 150 - 400 K/uL   nRBC 0.0 0.0 - 0.2 %  Troponin I (High Sensitivity)  Result Value Ref Range   Troponin I (High Sensitivity) 6 <18 ng/L      Assessment & Plan:   Problem List Items Addressed This Visit      Cardiovascular and Mediastinum   Essential hypertension - Primary   Relevant Medications   lisinopril (ZESTRIL) 40 MG tablet     Musculoskeletal and Integument   Osteoarthritis of left knee    Will d/c meloxicam at bedtime and start voltaren gel  as needed throughout the day, particularly given his elevated BPs and daily xarelto feel this is a safer option for him. Continue working with Orthopedics on long term management          Follow up plan: Return in about 4 weeks (around 02/11/2019) for BP.

## 2019-02-11 DIAGNOSIS — D2261 Melanocytic nevi of right upper limb, including shoulder: Secondary | ICD-10-CM | POA: Diagnosis not present

## 2019-02-11 DIAGNOSIS — D225 Melanocytic nevi of trunk: Secondary | ICD-10-CM | POA: Diagnosis not present

## 2019-02-11 DIAGNOSIS — Z85828 Personal history of other malignant neoplasm of skin: Secondary | ICD-10-CM | POA: Diagnosis not present

## 2019-02-11 DIAGNOSIS — L570 Actinic keratosis: Secondary | ICD-10-CM | POA: Diagnosis not present

## 2019-02-11 DIAGNOSIS — D2262 Melanocytic nevi of left upper limb, including shoulder: Secondary | ICD-10-CM | POA: Diagnosis not present

## 2019-02-11 DIAGNOSIS — D485 Neoplasm of uncertain behavior of skin: Secondary | ICD-10-CM | POA: Diagnosis not present

## 2019-02-11 DIAGNOSIS — X32XXXA Exposure to sunlight, initial encounter: Secondary | ICD-10-CM | POA: Diagnosis not present

## 2019-02-11 DIAGNOSIS — C44219 Basal cell carcinoma of skin of left ear and external auricular canal: Secondary | ICD-10-CM | POA: Diagnosis not present

## 2019-02-11 DIAGNOSIS — C44519 Basal cell carcinoma of skin of other part of trunk: Secondary | ICD-10-CM | POA: Diagnosis not present

## 2019-02-11 DIAGNOSIS — L821 Other seborrheic keratosis: Secondary | ICD-10-CM | POA: Diagnosis not present

## 2019-02-17 ENCOUNTER — Telehealth: Payer: Self-pay | Admitting: Family Medicine

## 2019-02-17 DIAGNOSIS — L988 Other specified disorders of the skin and subcutaneous tissue: Secondary | ICD-10-CM | POA: Diagnosis not present

## 2019-02-17 DIAGNOSIS — L578 Other skin changes due to chronic exposure to nonionizing radiation: Secondary | ICD-10-CM | POA: Diagnosis not present

## 2019-02-17 DIAGNOSIS — L814 Other melanin hyperpigmentation: Secondary | ICD-10-CM | POA: Diagnosis not present

## 2019-02-17 DIAGNOSIS — C44212 Basal cell carcinoma of skin of right ear and external auricular canal: Secondary | ICD-10-CM | POA: Diagnosis not present

## 2019-02-17 DIAGNOSIS — H61111 Acquired deformity of pinna, right ear: Secondary | ICD-10-CM | POA: Diagnosis not present

## 2019-02-17 NOTE — Telephone Encounter (Signed)
Whatever patient prefers is fine

## 2019-02-17 NOTE — Telephone Encounter (Signed)
Pt notified screened will come into office

## 2019-02-17 NOTE — Telephone Encounter (Signed)
Called pt to set up virtual, his wife Romie Minus answered saying that they have a bp cuff, but was told to bring it into the office at the next appt to compare the reading to ours. She is wanting to make sure that theirs is accurate. Please advise if pt should be seen in office or still virtual.

## 2019-02-18 ENCOUNTER — Ambulatory Visit (INDEPENDENT_AMBULATORY_CARE_PROVIDER_SITE_OTHER): Payer: PPO | Admitting: Family Medicine

## 2019-02-18 ENCOUNTER — Encounter: Payer: Self-pay | Admitting: Family Medicine

## 2019-02-18 ENCOUNTER — Other Ambulatory Visit: Payer: Self-pay

## 2019-02-18 VITALS — BP 148/89 | HR 62 | Temp 98.0°F | Ht 70.0 in | Wt 179.0 lb

## 2019-02-18 DIAGNOSIS — I10 Essential (primary) hypertension: Secondary | ICD-10-CM | POA: Diagnosis not present

## 2019-02-18 MED ORDER — LISINOPRIL 40 MG PO TABS: 40.0000 mg | ORAL_TABLET | Freq: Every day | ORAL | 0 refills | Status: DC

## 2019-02-18 MED ORDER — HYDROCHLOROTHIAZIDE 12.5 MG PO TABS: 12.5000 mg | ORAL_TABLET | Freq: Every day | ORAL | 0 refills | Status: DC

## 2019-02-18 NOTE — Progress Notes (Signed)
BP (!) 148/89   Pulse 62   Temp 98 F (36.7 C) (Oral)   Ht 5\' 10"  (1.778 m)   Wt 179 lb (81.2 kg)   SpO2 96%   BMI 25.68 kg/m    Subjective:    Patient ID: Neil Bush, male    DOB: Aug 13, 1950, 68 y.o.   MRN: [id number]  HPI: Neil Bush is a 68 y.o. male  Chief Complaint  Patient presents with  . Hypertension   Patient presenting today for HTN f/u after increase in lisinopril dose to 40 mg. Tolerating well without side effects. Home BPs the past month have been ranging from 120 - 145/80s. Denies CP, SOB, HAs, dizziness.   Relevant past medical, surgical, family and social history reviewed and updated as indicated. Interim medical history since our last visit reviewed. Allergies and medications reviewed and updated.  Review of Systems  Per HPI unless specifically indicated above     Objective:    BP (!) 148/89   Pulse 62   Temp 98 F (36.7 C) (Oral)   Ht 5\' 10"  (1.778 m)   Wt 179 lb (81.2 kg)   SpO2 96%   BMI 25.68 kg/m   Wt Readings from Last 3 Encounters:  02/18/19 179 lb (81.2 kg)  12/31/18 180 lb (81.6 kg)  11/23/18 181 lb (82.1 kg)    Physical Exam Vitals and nursing note reviewed.  Constitutional:      Appearance: Normal appearance.  HENT:     Head: Atraumatic.  Eyes:     Extraocular Movements: Extraocular movements intact.     Conjunctiva/sclera: Conjunctivae normal.  Cardiovascular:     Rate and Rhythm: Normal rate and regular rhythm.  Pulmonary:     Effort: Pulmonary effort is normal.     Breath sounds: Normal breath sounds.  Musculoskeletal:        General: Normal range of motion.     Cervical back: Normal range of motion and neck supple.  Skin:    General: Skin is warm and dry.  Neurological:     General: No focal deficit present.     Mental Status: He is oriented to person, place, and time.  Psychiatric:        Mood and Affect: Mood normal.        Thought Content: Thought content normal.        Judgment: Judgment normal.      Results for orders placed or performed during the hospital encounter of [id number]  Basic metabolic panel  Result Value Ref Range   Sodium 141 135 - 145 mmol/L   Potassium 4.0 3.5 - 5.1 mmol/L   Chloride 107 98 - 111 mmol/L   CO2 25 22 - 32 mmol/L   Glucose, Bld 104 (H) 70 - 99 mg/dL   BUN 27 (H) 8 - 23 mg/dL   Creatinine, Ser 1.10 0.61 - 1.24 mg/dL   Calcium 9.2 8.9 - 10.3 mg/dL   GFR calc non Af Amer >60 >60 mL/min   GFR calc Af Amer >60 >60 mL/min   Anion gap 9 5 - 15  CBC  Result Value Ref Range   WBC 8.6 4.0 - 10.5 K/uL   RBC 5.01 4.22 - 5.81 MIL/uL   Hemoglobin 16.3 13.0 - 17.0 g/dL   HCT 46.9 39.0 - 52.0 %   MCV 93.6 80.0 - 100.0 fL   MCH 32.5 26.0 - 34.0 pg   MCHC 34.8 30.0 - 36.0 g/dL   RDW 12.8  11.5 - 15.5 %   Platelets 222 150 - 400 K/uL   nRBC 0.0 0.0 - 0.2 %  Troponin I (High Sensitivity)  Result Value Ref Range   Troponin I (High Sensitivity) 6 <18 ng/L      Assessment & Plan:   Problem List Items Addressed This Visit      Cardiovascular and Mediastinum   Essential hypertension - Primary    BPs still not consistently within goal range. WIll add low dose HCTZ and continue to monitor home BPs closely. Recheck bmp and BPs in 1 month      Relevant Medications   hydrochlorothiazide (HYDRODIURIL) 12.5 MG tablet   lisinopril (ZESTRIL) 40 MG tablet       Follow up plan: Return in about 4 weeks (around 03/18/2019) for BP, bmp.

## 2019-02-18 NOTE — Assessment & Plan Note (Signed)
BPs still not consistently within goal range. WIll add low dose HCTZ and continue to monitor home BPs closely. Recheck bmp and BPs in 1 month

## 2019-03-24 DIAGNOSIS — C44219 Basal cell carcinoma of skin of left ear and external auricular canal: Secondary | ICD-10-CM | POA: Diagnosis not present

## 2019-03-31 ENCOUNTER — Encounter: Payer: Self-pay | Admitting: Family Medicine

## 2019-03-31 ENCOUNTER — Other Ambulatory Visit: Payer: Self-pay

## 2019-03-31 ENCOUNTER — Ambulatory Visit (INDEPENDENT_AMBULATORY_CARE_PROVIDER_SITE_OTHER): Payer: PPO | Admitting: Family Medicine

## 2019-03-31 VITALS — BP 138/79 | HR 59 | Temp 97.7°F | Ht 69.5 in | Wt 177.0 lb

## 2019-03-31 DIAGNOSIS — I480 Paroxysmal atrial fibrillation: Secondary | ICD-10-CM | POA: Diagnosis not present

## 2019-03-31 DIAGNOSIS — Z0000 Encounter for general adult medical examination without abnormal findings: Secondary | ICD-10-CM

## 2019-03-31 DIAGNOSIS — E7800 Pure hypercholesterolemia, unspecified: Secondary | ICD-10-CM | POA: Diagnosis not present

## 2019-03-31 DIAGNOSIS — I10 Essential (primary) hypertension: Secondary | ICD-10-CM

## 2019-03-31 DIAGNOSIS — N400 Enlarged prostate without lower urinary tract symptoms: Secondary | ICD-10-CM | POA: Diagnosis not present

## 2019-03-31 DIAGNOSIS — Z23 Encounter for immunization: Secondary | ICD-10-CM

## 2019-03-31 DIAGNOSIS — E782 Mixed hyperlipidemia: Secondary | ICD-10-CM

## 2019-03-31 DIAGNOSIS — C44519 Basal cell carcinoma of skin of other part of trunk: Secondary | ICD-10-CM | POA: Diagnosis not present

## 2019-03-31 DIAGNOSIS — S01301A Unspecified open wound of right ear, initial encounter: Secondary | ICD-10-CM | POA: Diagnosis not present

## 2019-03-31 LAB — MICROSCOPIC EXAMINATION
Bacteria, UA: NONE SEEN
WBC, UA: NONE SEEN /hpf (ref 0–5)

## 2019-03-31 LAB — MICROALBUMIN, URINE WAIVED
Creatinine, Urine Waived: 300 mg/dL (ref 10–300)
Microalb, Ur Waived: 30 mg/L — ABNORMAL HIGH (ref 0–19)
Microalb/Creat Ratio: <30 mg/g (ref <30)

## 2019-03-31 LAB — UA/M W/RFLX CULTURE, ROUTINE
Bilirubin, UA: NEGATIVE
Glucose, UA: NEGATIVE
Ketones, UA: NEGATIVE
Leukocytes,UA: NEGATIVE
Nitrite, UA: NEGATIVE
Protein,UA: NEGATIVE
Specific Gravity, UA: >1.03 — ABNORMAL HIGH (ref 1.005–1.030)
Urobilinogen, Ur: 0.2 mg/dL (ref 0.2–1.0)
pH, UA: 5 (ref 5.0–7.5)

## 2019-03-31 MED ORDER — HYDROCHLOROTHIAZIDE 12.5 MG PO TABS: 12.5000 mg | ORAL_TABLET | Freq: Every day | ORAL | 1 refills | Status: DC

## 2019-03-31 MED ORDER — METOPROLOL SUCCINATE ER 50 MG PO TB24: 25.0000 mg | ORAL_TABLET | Freq: Every day | ORAL | 1 refills | Status: DC

## 2019-03-31 MED ORDER — RIVAROXABAN 20 MG PO TABS: 20.0000 mg | ORAL_TABLET | Freq: Every day | ORAL | 3 refills | Status: DC

## 2019-03-31 MED ORDER — ATORVASTATIN CALCIUM 40 MG PO TABS: 20.0000 mg | ORAL_TABLET | Freq: Every day | ORAL | 1 refills | Status: DC

## 2019-03-31 MED ORDER — LISINOPRIL 40 MG PO TABS: 40.0000 mg | ORAL_TABLET | Freq: Every day | ORAL | 1 refills | Status: DC

## 2019-03-31 NOTE — Patient Instructions (Addendum)
Preventative Services:  Health Risk Assessment and Personalized Prevention Plan: done today Bone Mass Measurements: N/A CVD Screening: Done today Colon Cancer Screening: Up to date Depression Screening: Done today Diabetes Screening: Done today Glaucoma Screening: See your eye doctor Hepatitis B vaccine: N/A Hepatitis C screening: Up to date HIV Screening: N/A Flu Vaccine: Up to date Lung cancer Screening: N/A Obesity Screening: Done today Pneumonia Vaccines (2):Up to date STI Screening: N/A PSA screening: Done today   Health Maintenance After Age 68 After age 5, you are at a higher risk for certain long-term diseases and infections as well as injuries from falls. Falls are a major cause of broken bones and head injuries in people who are older than age 17. Getting regular preventive care can help to keep you healthy and well. Preventive care includes getting regular testing and making lifestyle changes as recommended by your health care provider. Talk with your health care provider about:  Which screenings and tests you should have. A screening is a test that checks for a disease when you have no symptoms.  A diet and exercise plan that is right for you. What should I know about screenings and tests to prevent falls? Screening and testing are the best ways to find a health problem early. Early diagnosis and treatment give you the best chance of managing medical conditions that are common after age 64. Certain conditions and lifestyle choices may make you more likely to have a fall. Your health care provider may recommend:  Regular vision checks. Poor vision and conditions such as cataracts can make you more likely to have a fall. If you wear glasses, make sure to get your prescription updated if your vision changes.  Medicine review. Work with your health care provider to regularly review all of the medicines you are taking, including over-the-counter medicines. Ask your health care  provider about any side effects that may make you more likely to have a fall. Tell your health care provider if any medicines that you take make you feel dizzy or sleepy.  Osteoporosis screening. Osteoporosis is a condition that causes the bones to get weaker. This can make the bones weak and cause them to break more easily.  Blood pressure screening. Blood pressure changes and medicines to control blood pressure can make you feel dizzy.  Strength and balance checks. Your health care provider may recommend certain tests to check your strength and balance while standing, walking, or changing positions.  Foot health exam. Foot pain and numbness, as well as not wearing proper footwear, can make you more likely to have a fall.  Depression screening. You may be more likely to have a fall if you have a fear of falling, feel emotionally low, or feel unable to do activities that you used to do.  Alcohol use screening. Using too much alcohol can affect your balance and may make you more likely to have a fall. What actions can I take to lower my risk of falls? General instructions  Talk with your health care provider about your risks for falling. Tell your health care provider if: ? You fall. Be sure to tell your health care provider about all falls, even ones that seem minor. ? You feel dizzy, sleepy, or off-balance.  Take over-the-counter and prescription medicines only as told by your health care provider. These include any supplements.  Eat a healthy diet and maintain a healthy weight. A healthy diet includes low-fat dairy products, low-fat (lean) meats, and fiber from whole  grains, beans, and lots of fruits and vegetables. Home safety  Remove any tripping hazards, such as rugs, cords, and clutter.  Install safety equipment such as grab bars in bathrooms and safety rails on stairs.  Keep rooms and walkways well-lit. Activity   Follow a regular exercise program to stay fit. This will help  you maintain your balance. Ask your health care provider what types of exercise are appropriate for you.  If you need a cane or walker, use it as recommended by your health care provider.  Wear supportive shoes that have nonskid soles. Lifestyle  Do not drink alcohol if your health care provider tells you not to drink.  If you drink alcohol, limit how much you have: ? 0-1 drink a day for women. ? 0-2 drinks a day for men.  Be aware of how much alcohol is in your drink. In the [place] one drink equals one typical bottle of beer (12 oz), one-half glass of wine (5 oz), or one shot of hard liquor (1 oz).  Do not use any products that contain nicotine or tobacco, such as cigarettes and e-cigarettes. If you need help quitting, ask your health care provider. Summary  Having a healthy lifestyle and getting preventive care can help to protect your health and wellness after age 76.  Screening and testing are the best way to find a health problem early and help you avoid having a fall. Early diagnosis and treatment give you the best chance for managing medical conditions that are more common for people who are older than age 42.  Falls are a major cause of broken bones and head injuries in people who are older than age 64. Take precautions to prevent a fall at home.  Work with your health care provider to learn what changes you can make to improve your health and wellness and to prevent falls. This information is not intended to replace advice given to you by your health care provider. Make sure you discuss any questions you have with your health care provider. Document Revised: 06/10/2018 Document Reviewed: 12/31/2016 Elsevier Patient Education  2020 [employer].

## 2019-03-31 NOTE — Progress Notes (Signed)
Cardiology Office Note    Date:  04/04/2019   ID:  KYSON CICHOWSKI, DOB May 16, 1950, MRN [id number]  PCP:  Guadalupe Maple, MD  Cardiologist:  Ida Rogue, MD  Electrophysiologist:  None   Chief Complaint: Follow-up  History of Present Illness:   Neil Bush is a 69 y.o. male with history of PAF, HTN, HLD, and chest pain who presents for follow-up of ED visit for hypertension.  Patient was previously followed by Dr. Yvone Neu though has subsequently transitioned to Dr. Rockey Situ.  Patient's A. fib was initially diagnosed in 01/2016 during admission for chest pain and A. fib.  He converted to sinus rhythm with IV diltiazem.  He has been anticoagulated with Xarelto in the setting of a CHADS2VASc of 2 (HTN, age x 1).  Echo in 01/2016 showed an EF of 55 to 60%, normal LV diastolic function, trivial aortic valve insufficiency.  Follow-up stress testing in 02/2016, did not show any evidence of ischemia and was overall low risk with an EF of 55 to 65%.  In 08/2016 noting intermittent palpitations with Zio patch at that time showing sinus rhythm with symptomatic PACs and 3 short runs of SVT/atrial tachycardia with a maximum interval of 11 beats.  There was no evidence of recurrent A. fib.  With addition of short acting diltiazem he noted improvement in palpitations.  He was seen in the ED in 09/2018 with chest pain and shortness of breath with recurrent A. fib with RVR with ventricular rates int o the 130s bpm.  He converted to sinus rhythm in the ED spontaneously with improvement in symptoms.  Initial high-sensitivity troponin 99 with a delta of 219.  He underwent outpatient nuclear stress testing on 09/30/2018 which showed no significant ischemia with an EF estimated at 42%, felt to be likely depressed secondary to GI uptake artifact, and was overall a low risk study.  He was last seen in the office in 11/2018 and was doing well without further symptoms concerning for A. Fib.  He was seen in the ED on  12/31/2018 with complaints of high blood pressure and intermittent headache.  BP in ED was noted to be 188/96.  EKG showed sinus bradycardia with no acute ST-T changes.  High-sensitivity troponin negative x1.  Patient's BP improved by 40 points without intervention outside of IV fluids.  His lisinopril was titrated with recommendation to follow-up as outpatient.  He was most recently seen by PCP on 03/31/2019 with BP of 138/79 and heart rate of 59 bpm.  He was started on HCTZ 12.5 mg daily at that time.  He comes in today doing well from a cardiac perspective.  He does wonder if his above ED visit in 12/2018 was related to some palpitations consistent with prior A. fib.  Since that day he has not had any further palpitations.  He indicates since he was started on HCTZ as above he has been taking a half tab of lisinopril daily as he was worried about dropping his blood pressure too low.  He also has been taking only half tab of Toprol XL daily.  He does wonder if his blood pressure of 118/64 is a little too low today.  He denies any orthostasis with this.  He has not had any falls, hematochezia, or melena since he was last seen.  No lower extremity swelling, abdominal distention, orthopnea, PND, or early satiety.  He notes approximately 2-3 episodes per year of mild chest discomfort and dyspnea that typically last 2  to 3 minutes followed by spontaneous resolution.  He is currently symptom-free and without complaints.   Labs independently reviewed: 03/2019 -TSH normal, TC 153, TG 92, HDL 43, LDL 78, BUN 23, serum creatinine 1.29, potassium 4.1, albumin 4.5, AST/ALT normal, Hgb 16.3, PLT 246  Past Medical History:  Diagnosis Date   Chest pain    a. 01/2016 in setting of Afib;  b. 02/2016 Ex MV: EF 55-65%, no ischemia.   Dysrhythmia    Headache    Hyperlipidemia    Hypertension    PAF (paroxysmal atrial fibrillation) ([place])    a. 01/2016 - admission for rapid AF and c/p-->converted on IV dilt;  b.  CHA2DS2VASc = 2-->Xarelto;  c. 01/2016 Echo: EF 55-60%, triv AI.   Palpitations    a. 08/2016 Zio Patch: symptomatic PAC's and brief (max 11 beats) of SVT/PAT (3 episodes).   Skin cancer     Past Surgical History:  Procedure Laterality Date   CARDIAC CATHETERIZATION     [facility]    CHOLECYSTECTOMY     COLONOSCOPY WITH PROPOFOL N/A 04/17/2017   Procedure: COLONOSCOPY WITH PROPOFOL;  Surgeon: Lucilla Lame, MD;  Location: [facility];  Service: Endoscopy;  Laterality: N/A;   EYE SURGERY     FOOT SURGERY     HERNIA REPAIR     X 2 ingunial   KNEE SURGERY Right    POLYPECTOMY  04/17/2017   Procedure: POLYPECTOMY INTESTINAL;  Surgeon: Lucilla Lame, MD;  Location: [facility];  Service: Endoscopy;;    Current Medications: Current Meds  Medication Sig   atorvastatin (LIPITOR) 40 MG tablet Take 0.5 tablets (20 mg total) by mouth daily at 6 PM.   diclofenac Sodium (VOLTAREN) 1 % GEL Apply 2 g topically 4 (four) times daily. (Patient taking differently: Apply 2 g topically 4 (four) times daily as needed. )   hydrochlorothiazide (HYDRODIURIL) 12.5 MG tablet Take 1 tablet (12.5 mg total) by mouth daily.   lisinopril (ZESTRIL) 40 MG tablet Take 1 tablet (40 mg total) by mouth daily. (Patient taking differently: Take 20 mg by mouth daily. )   meloxicam (MOBIC) 15 MG tablet Take 15 mg by mouth as needed.    metoprolol succinate (TOPROL-XL) 50 MG 24 hr tablet Take 1 tablet (50 mg total) by mouth daily. Take with or immediately following a meal.   rivaroxaban (XARELTO) 20 MG TABS tablet Take 1 tablet (20 mg total) by mouth daily.    Allergies:   Penicillins   Social History   Socioeconomic History   Marital status: Married    Spouse name: Not on file   Number of children: Not on file   Years of education: Not on file   Highest education level: Not on file  Occupational History   Occupation: partime truck driver  Tobacco Use   Smoking status: Never Smoker     Smokeless tobacco: Never Used  Substance and Sexual Activity   Alcohol use: No    Alcohol/week: 0.0 standard drinks   Drug use: No   Sexual activity: Yes  Other Topics Concern   Not on file  Social History Narrative   Not on file   Social Determinants of Health   Financial Resource Strain:    Difficulty of Paying Living Expenses: Not on file  Food Insecurity:    Worried About [place] in the Last Year: Not on file   [employer] of Food in the Last Year: Not on file  Transportation Needs:  Lack of Transportation (Medical): Not on file   Lack of Transportation (Non-Medical): Not on file  Physical Activity:    Days of Exercise per Week: Not on file   Minutes of Exercise per Session: Not on file  Stress:    Feeling of Stress : Not on file  Social Connections:    Frequency of Communication with Friends and Family: Not on file   Frequency of Social Gatherings with Friends and Family: Not on file   Attends Religious Services: Not on file   Active Member of Clubs or Organizations: Not on file   Attends Archivist Meetings: Not on file   Marital Status: Not on file     Family History:  The patient's family history includes Cancer in his mother; Diabetes in his father; Heart disease in his brother and father; Hypertension in his father. There is no history of COPD or Stroke.  ROS:   Review of Systems  Constitutional: Positive for malaise/fatigue. Negative for chills, diaphoresis, fever and weight loss.  HENT: Negative for congestion.   Eyes: Negative for discharge and redness.  Respiratory: Positive for shortness of breath. Negative for cough, sputum production and wheezing.   Cardiovascular: Negative for chest pain, palpitations, orthopnea, claudication, leg swelling and PND.  Gastrointestinal: Negative for abdominal pain, blood in stool, heartburn, melena, nausea and vomiting.  Musculoskeletal: Negative for falls and myalgias.  Skin:  Negative for rash.  Neurological: Negative for dizziness, tingling, tremors, sensory change, speech change, focal weakness, loss of consciousness and weakness.  Endo/Heme/Allergies: Does not bruise/bleed easily.  Psychiatric/Behavioral: Negative for substance abuse. The patient is not nervous/anxious.   All other systems reviewed and are negative.    EKGs/Labs/Other Studies Reviewed:    Studies reviewed were summarized above. The additional studies were reviewed today:  Lexiscan Myoview 09/2018: Pharmacological myocardial perfusion imaging study with no significant ischemia Normal wall motion, EF estimated at 42% (depressed EF likely secondary to GI uptake artifact) No EKG changes concerning for ischemia at peak stress or in recovery. Low risk scan __________  2D echo 01/2016: - Left ventricle: The cavity size was normal. Wall thickness was  normal. Systolic function was normal. The estimated ejection  fraction was in the range of 55% to 60%. Left ventricular  diastolic function parameters were normal for the patient&'s age.  - Aortic valve: There was trivial regurgitation.  __________   EKG:  EKG is ordered today.  The EKG ordered today demonstrates NSR, 70 bpm, normal axis, no acute ST-T changes  Recent Labs: 03/31/2019: ALT 23; BUN 23; Creatinine, Ser 1.29; Hemoglobin 16.3; Platelets 246; Potassium 4.1; Sodium 140; TSH 1.780  Recent Lipid Panel    Component Value Date/Time   CHOL 153 03/31/2019 1508   CHOL 135 10/15/2018 0812   TRIG 192 (H) 03/31/2019 1508   TRIG 118 10/15/2018 0812   HDL 43 03/31/2019 1508   CHOLHDL 2.8 03/29/2018 1459   VLDL 24 10/15/2018 0812   LDLCALC 78 03/31/2019 1508    PHYSICAL EXAM:    VS:  BP 118/64 (BP Location: Left Arm, Patient Position: Sitting, Cuff Size: Normal)    Pulse 70    Ht 5\' 10"  (1.778 m)    Wt 181 lb 4 oz (82.2 kg)    SpO2 97%    BMI 26.01 kg/m   BMI: Body mass index is 26.01 kg/m.  Physical Exam  Constitutional: He  is oriented to person, place, and time. He appears well-developed and well-nourished.  HENT:  Head: Normocephalic  and atraumatic.  Eyes: Right eye exhibits no discharge. Left eye exhibits no discharge.  Neck: No JVD present.  Cardiovascular: Normal rate, regular rhythm, S1 normal, S2 normal and normal heart sounds. Exam reveals no distant heart sounds, no friction rub, no midsystolic click and no opening snap.  No murmur heard. Pulses:      Posterior tibial pulses are 2+ on the right side and 2+ on the left side.  Pulmonary/Chest: Effort normal and breath sounds normal. No respiratory distress. He has no decreased breath sounds. He has no wheezes. He has no rales. He exhibits no tenderness.  Abdominal: Soft. He exhibits no distension. There is no abdominal tenderness.  Musculoskeletal:        General: No edema.     Cervical back: Normal range of motion.  Neurological: He is alert and oriented to person, place, and time.  Skin: Skin is warm and dry. No cyanosis. Nails show no clubbing.  Psychiatric: He has a normal mood and affect. His speech is normal and behavior is normal. Judgment and thought content normal.    Wt Readings from Last 3 Encounters:  04/04/19 181 lb 4 oz (82.2 kg)  03/31/19 177 lb (80.3 kg)  02/18/19 179 lb (81.2 kg)     ASSESSMENT & PLAN:   1. PAF: He is maintaining sinus rhythm without any further palpitations.  He tells me he has only been taking Toprol-XL 25 mg daily for rate control rather than the previously recommended 50 mg daily.  Based on his BP/HR readings at home when he calls back in 2 weeks, we may consider titrating Toprol-XL up to 50 mg daily in an effort to provide adequate rate control, reduce palpitation burden, and improve BP readings.  CHADS2VASc at least 2 (HTN, age x 1).  He is tolerating Xarelto 20 mg daily without any symptoms concerning for bleeding with recent normal hemoglobin.  Should he note recurrence of palpitations or increased burden of  symptoms we could consider outpatient cardiac monitoring to quantify burden.  2. HTN: Blood pressure is very well controlled today in the office.  However, he does wonder if this is too low for him.  He denies any symptoms consistent with orthostasis.  That said, he was noted to have some mild AKI on his most recent renal function following initiation of HCTZ.  Given the patient concerns, we will discontinue HCTZ and he will go back to taking 40 mg of lisinopril daily along with Toprol-XL as outlined above.  Low-sodium diet recommended.  He will check his blood pressure and heart rate for the next 2 weeks and call with these readings with further recommendations pending.  3. Dyspnea: Symptoms are stable over the past several years with the patient noting 2-3 episodes per year of mild dyspnea/chest discomfort that last for approximately 2 to 3 minutes with spontaneous resolution.  Recent nuclear MPI nonischemic with an EF of approximately 42%, felt to be falsely reduced secondary to GI uptake artifact.  In this setting, schedule echo to estimate LV systolic function.  4. AKI: Discontinue HCTZ as outlined above.  Follow-up BMP at next visit.  Disposition: F/u with Dr. Rockey Situ or an APP in 3 months.   Medication Adjustments/Labs and Tests Ordered: Current medicines are reviewed at length with the patient today.  Concerns regarding medicines are outlined above. Medication changes, Labs and Tests ordered today are summarized above and listed in the Patient Instructions accessible in Encounters.   Signed, Christell Faith, PA-C 04/04/2019 2:12 PM  [address] [place], [place] [address] [phone]

## 2019-03-31 NOTE — Progress Notes (Signed)
BP 138/79   Pulse (!) 59   Temp 97.7 F (36.5 C) (Oral)   Ht 5' 9.5" (1.765 m)   Wt 177 lb (80.3 kg)   SpO2 95%   BMI 25.76 kg/m    Subjective:    Patient ID: BEE HEFEL, male    DOB: 10-11-1950, 69 y.o.   MRN: [id number]  HPI: Neil Bush is a 69 y.o. male presenting on 03/31/2019 for comprehensive medical examination. Current medical complaints include:  HYPERTENSION / HYPERLIPIDEMIA Satisfied with current treatment? yes Duration of hypertension: chronic BP monitoring frequency: not checking BP medication side effects: no Past BP meds: lisinopril, HCTZ, metoprolol Duration of hyperlipidemia: chronic Cholesterol medication side effects: no Cholesterol supplements: none Past cholesterol medications: atorvastatin Medication compliance: excellent compliance Aspirin: no Recent stressors: no Recurrent headaches: no Visual changes: no Palpitations: no Dyspnea: no Chest pain: no Lower extremity edema: no Dizzy/lightheaded: no  Interim Problems from his last visit: no  Functional Status Survey: Is the patient deaf or have difficulty hearing?: No Does the patient have difficulty seeing, even when wearing glasses/contacts?: No Does the patient have difficulty concentrating, remembering, or making decisions?: No Does the patient have difficulty walking or climbing stairs?: No Does the patient have difficulty dressing or bathing?: No Does the patient have difficulty doing errands alone such as visiting a doctor's office or shopping?: No  FALL RISK: Fall Risk  03/31/2019 03/29/2018 04/09/2017 02/05/2017 09/01/2016  Falls in the past year? 0 0 No No No  Number falls in past yr: 0 0 - - -  Injury with Fall? 0 0 - - -  Follow up - Falls evaluation completed - - -    Depression Screen Depression screen [facility] 2/9 03/31/2019 04/09/2017 02/05/2017 09/01/2016 01/21/2016  Decreased Interest 0 0 0 0 0  Down, Depressed, Hopeless 0 0 0 0 0  PHQ - 2 Score 0 0 0 0 0  Altered sleeping 0 - -  - -  Tired, decreased energy 0 - - - -  Change in appetite 0 - - - -  Feeling bad or failure about yourself  0 - - - -  Trouble concentrating 0 - - - -  Moving slowly or fidgety/restless 0 - - - -  Suicidal thoughts 0 - - - -  PHQ-9 Score 0 - - - -    Advanced Directives Has one- not on file, will bring one in  Past Medical History:  Past Medical History:  Diagnosis Date  . Chest pain    a. 01/2016 in setting of Afib;  b. 02/2016 Ex MV: EF 55-65%, no ischemia.  Marland Kitchen Dysrhythmia   . Headache   . Hyperlipidemia   . Hypertension   . PAF (paroxysmal atrial fibrillation) ([place])    a. 01/2016 - admission for rapid AF and c/p-->converted on IV dilt;  b. CHA2DS2VASc = 2-->Xarelto;  c. 01/2016 Echo: EF 55-60%, triv AI.  Marland Kitchen Palpitations    a. 08/2016 Zio Patch: symptomatic PAC's and brief (max 11 beats) of SVT/PAT (3 episodes).  . Skin cancer     Surgical History:  Past Surgical History:  Procedure Laterality Date  . CARDIAC CATHETERIZATION     [facility]   . CHOLECYSTECTOMY    . COLONOSCOPY WITH PROPOFOL N/A 04/17/2017   Procedure: COLONOSCOPY WITH PROPOFOL;  Surgeon: Lucilla Lame, MD;  Location: [place];  Service: Endoscopy;  Laterality: N/A;  . EYE SURGERY    . FOOT SURGERY    . HERNIA REPAIR  X 2 ingunial  . KNEE SURGERY Right   . POLYPECTOMY  04/17/2017   Procedure: POLYPECTOMY INTESTINAL;  Surgeon: Lucilla Lame, MD;  Location: [place];  Service: Endoscopy;;    Medications:  Current Outpatient Medications on File Prior to Visit  Medication Sig  . diclofenac Sodium (VOLTAREN) 1 % GEL Apply 2 g topically 4 (four) times daily.  . meloxicam (MOBIC) 15 MG tablet Take 15 mg by mouth Nightly.   No current facility-administered medications on file prior to visit.    Allergies:  Allergies  Allergen Reactions  . Penicillins Rash    Has patient had a PCN reaction causing immediate rash, facial/tongue/throat swelling, SOB or lightheadedness with hypotension:  no Has patient had a PCN reaction causing severe rash involving mucus membranes or skin necrosis: no Has patient had a PCN reaction that required hospitalization no Has patient had a PCN reaction occurring within the last 10 years: no If all of the above answers are "NO", then may proceed with Cephalosporin use.    Childhood allergy    Social History:  Social History   Socioeconomic History  . Marital status: Married    Spouse name: Not on file  . Number of children: Not on file  . Years of education: Not on file  . Highest education level: Not on file  Occupational History  . Occupation: partime truck Geophysicist/field seismologist  Tobacco Use  . Smoking status: Never Smoker  . Smokeless tobacco: Never Used  Substance and Sexual Activity  . Alcohol use: No    Alcohol/week: 0.0 standard drinks  . Drug use: No  . Sexual activity: Yes  Other Topics Concern  . Not on file  Social History Narrative  . Not on file   Social Determinants of Health   Financial Resource Strain:   . Difficulty of Paying Living Expenses: Not on file  Food Insecurity:   . Worried About Charity fundraiser in the Last Year: Not on file  . Ran Out of Food in the Last Year: Not on file  Transportation Needs:   . Lack of Transportation (Medical): Not on file  . Lack of Transportation (Non-Medical): Not on file  Physical Activity:   . Days of Exercise per Week: Not on file  . Minutes of Exercise per Session: Not on file  Stress:   . Feeling of Stress : Not on file  Social Connections:   . Frequency of Communication with Friends and Family: Not on file  . Frequency of Social Gatherings with Friends and Family: Not on file  . Attends Religious Services: Not on file  . Active Member of Clubs or Organizations: Not on file  . Attends Archivist Meetings: Not on file  . Marital Status: Not on file  Intimate Partner Violence:   . Fear of Current or Ex-Partner: Not on file  . Emotionally Abused: Not on file  .  Physically Abused: Not on file  . Sexually Abused: Not on file   Social History   Tobacco Use  Smoking Status Never Smoker  Smokeless Tobacco Never Used   Social History   Substance and Sexual Activity  Alcohol Use No  . Alcohol/week: 0.0 standard drinks    Family History:  Family History  Problem Relation Age of Onset  . Cancer Mother   . Heart disease Father   . Diabetes Father   . Hypertension Father   . Heart disease Brother   . COPD Neg Hx   . Stroke  Neg Hx     Past medical history, surgical history, medications, allergies, family history and social history reviewed with patient today and changes made to appropriate areas of the chart.   Review of Systems  Constitutional: Negative.   HENT: Negative.   Eyes: Negative.   Respiratory: Negative.   Cardiovascular: Negative.   Gastrointestinal: Negative.   Genitourinary: Negative.   Musculoskeletal: Negative.   Skin: Negative.   Neurological: Negative.   Endo/Heme/Allergies: Negative.   Psychiatric/Behavioral: Negative.     All other ROS negative except what is listed above and in the HPI.      Objective:    BP 138/79   Pulse (!) 59   Temp 97.7 F (36.5 C) (Oral)   Ht 5' 9.5" (1.765 m)   Wt 177 lb (80.3 kg)   SpO2 95%   BMI 25.76 kg/m   Wt Readings from Last 3 Encounters:  03/31/19 177 lb (80.3 kg)  02/18/19 179 lb (81.2 kg)  12/31/18 180 lb (81.6 kg)    Physical Exam Vitals and nursing note reviewed.  Constitutional:      General: He is not in acute distress.    Appearance: Normal appearance. He is normal weight. He is not ill-appearing, toxic-appearing or diaphoretic.  HENT:     Head: Normocephalic and atraumatic.     Comments: Well healing wound on R ear    Right Ear: Tympanic membrane, ear canal and external ear normal. There is no impacted cerumen.     Left Ear: Tympanic membrane, ear canal and external ear normal. There is no impacted cerumen.     Nose: Nose normal. No congestion or  rhinorrhea.     Mouth/Throat:     Mouth: Mucous membranes are moist.     Pharynx: Oropharynx is clear. No oropharyngeal exudate or posterior oropharyngeal erythema.  Eyes:     General: No scleral icterus.       Right eye: No discharge.        Left eye: No discharge.     Extraocular Movements: Extraocular movements intact.     Conjunctiva/sclera: Conjunctivae normal.     Pupils: Pupils are equal, round, and reactive to light.  Neck:     Vascular: No carotid bruit.  Cardiovascular:     Rate and Rhythm: Normal rate and regular rhythm.     Pulses: Normal pulses.     Heart sounds: No murmur. No friction rub. No gallop.   Pulmonary:     Effort: Pulmonary effort is normal. No respiratory distress.     Breath sounds: Normal breath sounds. No stridor. No wheezing, rhonchi or rales.  Chest:     Chest wall: No tenderness.  Abdominal:     General: Abdomen is flat. Bowel sounds are normal. There is no distension.     Palpations: Abdomen is soft. There is no mass.     Tenderness: There is no abdominal tenderness. There is no right CVA tenderness, left CVA tenderness, guarding or rebound.     Hernia: No hernia is present.  Genitourinary:    Comments: Genital exam deferred with shared decision making Musculoskeletal:        General: No swelling, tenderness, deformity or signs of injury.     Cervical back: Normal range of motion and neck supple. No rigidity. No muscular tenderness.     Right lower leg: No edema.     Left lower leg: No edema.  Lymphadenopathy:     Cervical: No cervical adenopathy.  Skin:  General: Skin is warm and dry.     Capillary Refill: Capillary refill takes less than 2 seconds.     Coloration: Skin is not jaundiced or pale.     Findings: No bruising, erythema, lesion or rash.  Neurological:     General: No focal deficit present.     Mental Status: He is alert and oriented to person, place, and time.     Cranial Nerves: No cranial nerve deficit.     Sensory: No  sensory deficit.     Motor: No weakness.     Coordination: Coordination normal.     Gait: Gait normal.     Deep Tendon Reflexes: Reflexes normal.  Psychiatric:        Mood and Affect: Mood normal.        Behavior: Behavior normal.        Thought Content: Thought content normal.        Judgment: Judgment normal.     6CIT Screen 03/31/2019  What Year? 0 points  What month? 0 points  What time? 0 points  Count back from 20 0 points  Months in reverse 0 points  Repeat phrase 2 points  Total Score 2     Results for orders placed or performed in visit on 03/31/19  Microscopic Examination   URINE  Result Value Ref Range   WBC, UA None seen 0 - 5 /hpf   RBC 0-2 0 - 2 /hpf   Epithelial Cells (non renal) 0-10 0 - 10 /hpf   Bacteria, UA None seen None seen/Few  CBC with Differential/Platelet  Result Value Ref Range   WBC 8.5 3.4 - 10.8 x10E3/uL   RBC 4.92 4.14 - 5.80 x10E6/uL   Hemoglobin 16.3 13.0 - 17.7 g/dL   Hematocrit 45.2 37.5 - 51.0 %   MCV 92 79 - 97 fL   MCH 33.1 (H) 26.6 - 33.0 pg   MCHC 36.1 (H) 31.5 - 35.7 g/dL   RDW 12.7 11.6 - 15.4 %   Platelets 246 150 - 450 x10E3/uL   Neutrophils 63 Not Estab. %   Lymphs 28 Not Estab. %   Monocytes 6 Not Estab. %   Eos 2 Not Estab. %   Basos 1 Not Estab. %   Neutrophils Absolute 5.4 1.4 - 7.0 x10E3/uL   Lymphocytes Absolute 2.4 0.7 - 3.1 x10E3/uL   Monocytes Absolute 0.5 0.1 - 0.9 x10E3/uL   EOS (ABSOLUTE) 0.2 0.0 - 0.4 x10E3/uL   Basophils Absolute 0.1 0.0 - 0.2 x10E3/uL   Immature Granulocytes 0 Not Estab. %   Immature Grans (Abs) 0.0 0.0 - 0.1 x10E3/uL  Comprehensive metabolic panel  Result Value Ref Range   Glucose 82 65 - 99 mg/dL   BUN 23 8 - 27 mg/dL   Creatinine, Ser 1.29 (H) 0.76 - 1.27 mg/dL   GFR calc non Af Amer 56 (L) >59 mL/min/1.73   GFR calc Af Amer 65 >59 mL/min/1.73   BUN/Creatinine Ratio 18 10 - 24   Sodium 140 134 - 144 mmol/L   Potassium 4.1 3.5 - 5.2 mmol/L   Chloride 99 96 - 106 mmol/L   CO2  26 20 - 29 mmol/L   Calcium 9.3 8.6 - 10.2 mg/dL   Total Protein 6.7 6.0 - 8.5 g/dL   Albumin 4.5 3.8 - 4.8 g/dL   Globulin, Total 2.2 1.5 - 4.5 g/dL   Albumin/Globulin Ratio 2.0 1.2 - 2.2   Bilirubin Total 0.8 0.0 - 1.2 mg/dL   Alkaline  Phosphatase 82 39 - 117 IU/L   AST 21 0 - 40 IU/L   ALT 23 0 - 44 IU/L  Lipid Panel w/o Chol/HDL Ratio  Result Value Ref Range   Cholesterol, Total 153 100 - 199 mg/dL   Triglycerides 192 (H) 0 - 149 mg/dL   HDL 43 >39 mg/dL   VLDL Cholesterol Cal 32 5 - 40 mg/dL   LDL Chol Calc (NIH) 78 0 - 99 mg/dL  Microalbumin, Urine Waived  Result Value Ref Range   Microalb, Ur Waived 30 (H) 0 - 19 mg/L   Creatinine, Urine Waived 300 10 - 300 mg/dL   Microalb/Creat Ratio <30 <30 mg/g  PSA  Result Value Ref Range   Prostate Specific Ag, Serum 0.9 0.0 - 4.0 ng/mL  TSH  Result Value Ref Range   TSH 1.780 0.450 - 4.500 uIU/mL  UA/M w/rflx Culture, Routine   Specimen: Urine   URINE  Result Value Ref Range   Specific Gravity, UA >1.030 (H) 1.005 - 1.030   pH, UA 5.0 5.0 - 7.5   Color, UA Yellow Yellow   Appearance Ur Clear Clear   Leukocytes,UA Negative Negative   Protein,UA Negative Negative/Trace   Glucose, UA Negative Negative   Ketones, UA Negative Negative   RBC, UA Trace (A) Negative   Bilirubin, UA Negative Negative   Urobilinogen, Ur 0.2 0.2 - 1.0 mg/dL   Nitrite, UA Negative Negative   Microscopic Examination See below:       Assessment & Plan:   Problem List Items Addressed This Visit      Cardiovascular and Mediastinum   Essential hypertension    Under good control on current regimen. Continue current regimen. Continue to monitor. Call with any concerns. Refills given. Labs drawn today.        Relevant Medications   atorvastatin (LIPITOR) 40 MG tablet   hydrochlorothiazide (HYDRODIURIL) 12.5 MG tablet   lisinopril (ZESTRIL) 40 MG tablet   metoprolol succinate (TOPROL-XL) 50 MG 24 hr tablet   rivaroxaban (XARELTO) 20 MG TABS  tablet   Other Relevant Orders   CBC with Differential/Platelet (Completed)   Comprehensive metabolic panel (Completed)   Microalbumin, Urine Waived (Completed)   TSH (Completed)   Paroxysmal atrial fibrillation (HCC)    Under good control on current regimen. Continue current regimen. Continue to monitor. Call with any concerns. Refills given. Labs drawn today.        Relevant Medications   atorvastatin (LIPITOR) 40 MG tablet   hydrochlorothiazide (HYDRODIURIL) 12.5 MG tablet   lisinopril (ZESTRIL) 40 MG tablet   metoprolol succinate (TOPROL-XL) 50 MG 24 hr tablet   rivaroxaban (XARELTO) 20 MG TABS tablet   Other Relevant Orders   CBC with Differential/Platelet (Completed)   Comprehensive metabolic panel (Completed)   TSH (Completed)     Genitourinary   BPH (benign prostatic hyperplasia)    Under good control on current regimen. Continue current regimen. Continue to monitor. Call with any concerns. Refills given. Labs drawn today.        Relevant Orders   CBC with Differential/Platelet (Completed)   Comprehensive metabolic panel (Completed)   PSA (Completed)   TSH (Completed)   UA/M w/rflx Culture, Routine (Completed)     Other   Hyperlipidemia    Under good control on current regimen. Continue current regimen. Continue to monitor. Call with any concerns. Refills given. Labs drawn today.        Relevant Medications   atorvastatin (LIPITOR) 40 MG tablet  hydrochlorothiazide (HYDRODIURIL) 12.5 MG tablet   lisinopril (ZESTRIL) 40 MG tablet   metoprolol succinate (TOPROL-XL) 50 MG 24 hr tablet   rivaroxaban (XARELTO) 20 MG TABS tablet   Other Relevant Orders   CBC with Differential/Platelet (Completed)   Comprehensive metabolic panel (Completed)   TSH (Completed)   CBC with Differential/Platelet (Completed)   Comprehensive metabolic panel (Completed)   Lipid Panel w/o Chol/HDL Ratio (Completed)   TSH (Completed)    Other Visit Diagnoses    Wellness examination     -  Primary   Preventative care discussed today as below.    Routine general medical examination at a health care facility       Vaccines up to date. Screening labs checked today. Colonoscopy up to date. Continue diet and exercise. Callw ith any concerns.    Open wound of auricle of right ear without complication, initial encounter       Healing well. Due for Td- given today.   Relevant Orders   Td : Tetanus/diphtheria >7yo Preservative  free (Completed)       Preventative Services:  Health Risk Assessment and Personalized Prevention Plan: done today Bone Mass Measurements: N/A CVD Screening: Done today Colon Cancer Screening: Up to date Depression Screening: Done today Diabetes Screening: Done today Glaucoma Screening: See your eye doctor Hepatitis B vaccine: N/A Hepatitis C screening: Up to date HIV Screening: N/A Flu Vaccine: Up to date Lung cancer Screening: N/A Obesity Screening: Done today Pneumonia Vaccines (2):Up to date STI Screening: N/A PSA screening: Done today  Discussed aspirin prophylaxis for myocardial infarction prevention and decision was it was not indicated  LABORATORY TESTING:  Health maintenance labs ordered today as discussed above.   The natural history of prostate cancer and ongoing controversy regarding screening and potential treatment outcomes of prostate cancer has been discussed with the patient. The meaning of a false positive PSA and a false negative PSA has been discussed. He indicates understanding of the limitations of this screening test and wishes to proceed with screening PSA testing.   IMMUNIZATIONS:   - Tdap: Tetanus vaccination status reviewed: Td vaccination indicated and given today. - Influenza: Up to date - Pneumovax: Up to date - Prevnar: Up to date  SCREENING: - Colonoscopy: Up to date  Discussed with patient purpose of the colonoscopy is to detect colon cancer at curable precancerous or early stages   PATIENT COUNSELING:      Sexuality: Discussed sexually transmitted diseases, partner selection, use of condoms, avoidance of unintended pregnancy  and contraceptive alternatives.   Advised to avoid cigarette smoking.  I discussed with the patient that most people either abstain from alcohol or drink within safe limits (<=14/week and <=4 drinks/occasion for males, <=7/weeks and <= 3 drinks/occasion for females) and that the risk for alcohol disorders and other health effects rises proportionally with the number of drinks per week and how often a drinker exceeds daily limits.  Discussed cessation/primary prevention of drug use and availability of treatment for abuse.   Diet: Encouraged to adjust caloric intake to maintain  or achieve ideal body weight, to reduce intake of dietary saturated fat and total fat, to limit sodium intake by avoiding high sodium foods and not adding table salt, and to maintain adequate dietary potassium and calcium preferably from fresh fruits, vegetables, and low-fat dairy products.    stressed the importance of regular exercise  Injury prevention: Discussed safety belts, safety helmets, smoke detector, smoking near bedding or upholstery.   Dental health: Discussed  importance of regular tooth brushing, flossing, and dental visits.   Follow up plan: NEXT PREVENTATIVE PHYSICAL DUE IN 1 YEAR. Return in about 6 months (around 09/28/2019) for 6 month follow up.

## 2019-04-01 ENCOUNTER — Encounter: Payer: Self-pay | Admitting: Family Medicine

## 2019-04-01 LAB — LIPID PANEL W/O CHOL/HDL RATIO
Cholesterol, Total: 153 mg/dL (ref 100–199)
HDL: 43 mg/dL (ref >39)
LDL Chol Calc (NIH): 78 mg/dL (ref 0–99)
Triglycerides: 192 mg/dL — ABNORMAL HIGH (ref 0–149)
VLDL Cholesterol Cal: 32 mg/dL (ref 5–40)

## 2019-04-01 LAB — CBC WITH DIFFERENTIAL/PLATELET
Basophils Absolute: 0.1 10*3/uL (ref 0.0–0.2)
Basos: 1 %
EOS (ABSOLUTE): 0.2 10*3/uL (ref 0.0–0.4)
Eos: 2 %
Hematocrit: 45.2 % (ref 37.5–51.0)
Hemoglobin: 16.3 g/dL (ref 13.0–17.7)
Immature Grans (Abs): 0 10*3/uL (ref 0.0–0.1)
Immature Granulocytes: 0 %
Lymphocytes Absolute: 2.4 10*3/uL (ref 0.7–3.1)
Lymphs: 28 %
MCH: 33.1 pg — ABNORMAL HIGH (ref 26.6–33.0)
MCHC: 36.1 g/dL — ABNORMAL HIGH (ref 31.5–35.7)
MCV: 92 fL (ref 79–97)
Monocytes Absolute: 0.5 10*3/uL (ref 0.1–0.9)
Monocytes: 6 %
Neutrophils Absolute: 5.4 10*3/uL (ref 1.4–7.0)
Neutrophils: 63 %
Platelets: 246 10*3/uL (ref 150–450)
RBC: 4.92 x10E6/uL (ref 4.14–5.80)
RDW: 12.7 % (ref 11.6–15.4)
WBC: 8.5 10*3/uL (ref 3.4–10.8)

## 2019-04-01 LAB — COMPREHENSIVE METABOLIC PANEL
ALT: 23 IU/L (ref 0–44)
AST: 21 IU/L (ref 0–40)
Albumin/Globulin Ratio: 2 (ref 1.2–2.2)
Albumin: 4.5 g/dL (ref 3.8–4.8)
Alkaline Phosphatase: 82 IU/L (ref 39–117)
BUN/Creatinine Ratio: 18 (ref 10–24)
BUN: 23 mg/dL (ref 8–27)
Bilirubin Total: 0.8 mg/dL (ref 0.0–1.2)
CO2: 26 mmol/L (ref 20–29)
Calcium: 9.3 mg/dL (ref 8.6–10.2)
Chloride: 99 mmol/L (ref 96–106)
Creatinine, Ser: 1.29 mg/dL — ABNORMAL HIGH (ref 0.76–1.27)
GFR calc Af Amer: 65 mL/min/{1.73_m2} (ref >59)
GFR calc non Af Amer: 56 mL/min/{1.73_m2} — ABNORMAL LOW (ref >59)
Globulin, Total: 2.2 g/dL (ref 1.5–4.5)
Glucose: 82 mg/dL (ref 65–99)
Potassium: 4.1 mmol/L (ref 3.5–5.2)
Sodium: 140 mmol/L (ref 134–144)
Total Protein: 6.7 g/dL (ref 6.0–8.5)

## 2019-04-01 LAB — TSH: TSH: 1.78 u[IU]/mL (ref 0.450–4.500)

## 2019-04-01 LAB — PSA: Prostate Specific Ag, Serum: 0.9 ng/mL (ref 0.0–4.0)

## 2019-04-03 NOTE — Assessment & Plan Note (Signed)
Under good control on current regimen. Continue current regimen. Continue to monitor. Call with any concerns. Refills given. Labs drawn today.   

## 2019-04-04 ENCOUNTER — Ambulatory Visit (INDEPENDENT_AMBULATORY_CARE_PROVIDER_SITE_OTHER): Payer: PPO | Admitting: Physician Assistant

## 2019-04-04 ENCOUNTER — Encounter: Payer: Self-pay | Admitting: Physician Assistant

## 2019-04-04 ENCOUNTER — Other Ambulatory Visit: Payer: Self-pay

## 2019-04-04 VITALS — BP 118/64 | HR 70 | Ht 70.0 in | Wt 181.2 lb

## 2019-04-04 DIAGNOSIS — R0600 Dyspnea, unspecified: Secondary | ICD-10-CM

## 2019-04-04 DIAGNOSIS — N179 Acute kidney failure, unspecified: Secondary | ICD-10-CM

## 2019-04-04 DIAGNOSIS — I10 Essential (primary) hypertension: Secondary | ICD-10-CM

## 2019-04-04 DIAGNOSIS — I480 Paroxysmal atrial fibrillation: Secondary | ICD-10-CM

## 2019-04-04 MED ORDER — LISINOPRIL 40 MG PO TABS: 40.0000 mg | ORAL_TABLET | Freq: Every day | ORAL | 1 refills | Status: DC

## 2019-04-04 NOTE — Patient Instructions (Signed)
Medication Instructions:  1- STOP HCTZ 2- Resume Lisinopril Take 1 tablet (40 mg total) by mouth daily *If you need a refill on your cardiac medications before your next appointment, please call your pharmacy*  Lab Work: None ordered  If you have labs (blood work) drawn today and your tests are completely normal, you will receive your results only by: Marland Kitchen MyChart Message (if you have MyChart) OR . A paper copy in the mail If you have any lab test that is abnormal or we need to change your treatment, we will call you to review the results.  Testing/Procedures: 1- Echo  Please return to [facility] on ______________ at _______________ AM/PM for an Echocardiogram. Your physician has requested that you have an echocardiogram. Echocardiography is a painless test that uses sound waves to create images of your heart. It provides your doctor with information about the size and shape of your heart and how well your heart's chambers and valves are working. This procedure takes approximately one hour. There are no restrictions for this procedure. Please note; depending on visual quality an IV may need to be placed.    Follow-Up: At [facility], you and your health needs are our priority.  As part of our continuing mission to provide you with exceptional heart care, we have created designated Provider Care Teams.  These Care Teams include your primary Cardiologist (physician) and Advanced Practice Providers (APPs -  Physician Assistants and Nurse Practitioners) who all work together to provide you with the care you need, when you need it.  Your next appointment:   3 month(s)  The format for your next appointment:   In Person  Provider:    You may see Ida Rogue, MD or Christell Faith, PA-C.

## 2019-04-21 ENCOUNTER — Telehealth: Payer: Self-pay | Admitting: Cardiovascular Disease

## 2019-04-21 NOTE — Telephone Encounter (Signed)
Patient calling in BP readings after medication change. Over the past two week it has averaged between 125-130/68-78

## 2019-04-27 NOTE — Telephone Encounter (Signed)
Spoke with patient and he just wanted [place] to know that his blood pressures have been doing much better. Advised that if it persists 140/90 or higher to please give [place] a call. He verbalized understanding with no further questions at this time.

## 2019-05-12 ENCOUNTER — Ambulatory Visit (INDEPENDENT_AMBULATORY_CARE_PROVIDER_SITE_OTHER): Payer: PPO

## 2019-05-12 ENCOUNTER — Other Ambulatory Visit: Payer: Self-pay

## 2019-05-12 DIAGNOSIS — R0600 Dyspnea, unspecified: Secondary | ICD-10-CM | POA: Diagnosis not present

## 2019-05-13 ENCOUNTER — Telehealth: Payer: Self-pay

## 2019-05-13 NOTE — Telephone Encounter (Signed)
-----   Message from Rise Mu, PA-C sent at 05/12/2019  5:44 PM EST ----- Echo showed normal pump function, normal wall motion, and a slightly stiffened heart. There were mildly leaky mitral and aortic valves noted.  Recommendations: -His echo confirms a normal pump function and that the EF on his stress test was falsely low.  -With regards to his slightly stiff heart, optimal BP control is advised. This was well controlled at his last visit.  -Follow up as planned.

## 2019-05-13 NOTE — Telephone Encounter (Signed)
Call to patient to discuss echo results. Spoke to wife, Neil Bush, okay per DPR.   She verbalized understanding and no further question or orders are needed at this time.   No further orders. Confirmed upcoming appt.   Advised pt to call for any further questions or concerns.

## 2019-07-04 NOTE — Progress Notes (Signed)
Cardiology Office Note  Date:  07/05/2019   ID:  CYE EINSTEIN, DOB [birth year], MRN [id number]  PCP:  Guadalupe Maple, MD   Chief Complaint  Patient presents with  . other    3 month follow up and discuss Echo results. Meds reviewed by the pt. verbally. Pt. c/o a spell of A-fib that lasted about 5 minutes 3 weeks ago.     HPI:  69 year old male with a prior history of  paroxysmal atrial fibrillation, 01/26/2016, 2020 CHA2DS2V 6 ASc score of 2 (HTN, agex1).  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  Felt his pulse, appreciated it was rapid and irregular Symptoms started approximately 6:30 AM and persisted Noted to be in atrial fibrillation with RVR rate 140 bpm He presented to the emergency room, converted to normal sinus rhythm around 11:30 AM tnt 200 Converted 2 normal sinus rhythm after 5 hours in the emergency room  EKG from the  emergency room showing confirming atrial fibrillation ventricular rate  140 bpm Stress test September 30, 2018 No ischemia low risk study  November 2017 he was admitted with chest pain and A. fib and converted to sinus rhythm on IV diltiazem.  Started on Xarelto in the setting of a CHA2DS2VASc of 2.   stress testing which did not show ischemia.  December 2017 Echo showed normal LV function.   event monitor which showed symptomatic PACs and 3 short runs of SVT/atrial tachycardia with a maximum of 11 beats.  He did not have any atrial fibrillation on this monitor.    PMH:   has a past medical history of Chest pain, Dysrhythmia, Headache, Hyperlipidemia, Hypertension, PAF (paroxysmal atrial fibrillation) ([place]), Palpitations, and Skin cancer.  PSH:    Past Surgical History:  Procedure Laterality Date  . CARDIAC CATHETERIZATION     [facility]   . CHOLECYSTECTOMY    . COLONOSCOPY WITH PROPOFOL N/A 04/17/2017   Procedure: COLONOSCOPY WITH PROPOFOL;  Surgeon: Lucilla Lame, MD;  Location: [facility];  Service: Endoscopy;  Laterality: N/A;  . EYE SURGERY    . FOOT SURGERY    . HERNIA REPAIR     X 2 ingunial  . KNEE SURGERY Right   . POLYPECTOMY  04/17/2017   Procedure: POLYPECTOMY INTESTINAL;  Surgeon: Lucilla Lame, MD;  Location: [facility];  Service: Endoscopy;;    Current Outpatient Medications  Medication Sig Dispense Refill  . atorvastatin (LIPITOR) 40 MG tablet Take 0.5 tablets (20 mg total) by mouth  daily at 6 PM. 45 tablet 1  . lisinopril (ZESTRIL) 40 MG tablet Take 1 tablet (40 mg total) by mouth daily. 90 tablet 1  . metoprolol succinate (TOPROL-XL) 50 MG 24 hr tablet Take 0.5 tablets (25 mg total) by mouth daily. Take with or immediately following a meal. 45 tablet 3  . rivaroxaban (XARELTO) 20 MG TABS tablet Take 1 tablet (20 mg total) by mouth daily. 90 tablet 3   No current facility-administered medications for this visit.     Allergies:   Penicillins   Social History:  The patient  reports that he has never smoked. He has never  used smokeless tobacco. He reports that he does not drink alcohol or use drugs.   Family History:   family history includes Cancer in his mother; Diabetes in his father; Heart disease in his brother and father; Hypertension in his father.    Review of Systems: Review of Systems  Constitutional: Negative.   Respiratory: Negative.   Cardiovascular: Positive for palpitations.  Gastrointestinal: Negative.   Musculoskeletal: Negative.   Neurological: Negative.   Psychiatric/Behavioral: Negative.   All other systems reviewed and are negative.    PHYSICAL EXAM: VS:  BP 122/72 (BP Location: Left Arm, Patient Position: Sitting, Cuff Size: Normal)   Pulse 60   Ht 5\' 10"  (1.778 m)   Wt 181 lb 4 oz (82.2 kg)   BMI 26.01 kg/m  , BMI Body mass index is 26.01 kg/m. Constitutional:  oriented to person, place, and time. No distress.  HENT:  Head: Grossly normal Eyes:  no discharge. No scleral icterus.  Neck: No JVD, no carotid bruits  Cardiovascular: Regular rate and rhythm, no murmurs appreciated Pulmonary/Chest: Clear to auscultation bilaterally, no wheezes or rails Abdominal: Soft.  no distension.  no tenderness.  Musculoskeletal: Normal range of motion Neurological:  normal muscle tone. Coordination normal. No atrophy Skin: Skin warm and dry Psychiatric: normal affect, pleasant  Recent Labs: 03/31/2019: ALT 23; BUN 23; Creatinine, Ser 1.29; Hemoglobin 16.3; Platelets 246; Potassium 4.1; Sodium 140; TSH 1.780    Lipid Panel Lab Results  Component Value Date   CHOL 153 03/31/2019   HDL 43 03/31/2019   LDLCALC 78 03/31/2019   TRIG 192 (H) 03/31/2019      Wt Readings from Last 3 Encounters:  07/05/19 181 lb 4 oz (82.2 kg)  04/04/19 181 lb 4 oz (82.2 kg)  03/31/19 177 lb (80.3 kg)       ASSESSMENT AND PLAN:  Paroxysmal atrial fibrillation (HCC) - Continue Xarelto 20 mg daily,  metoprolol succinate 25 mg daily Likes to have extra 25 mg to take as needed for breakthrough  tachycardia No changes to his regimen  Essential hypertension Continue metoprolol lisinopril Blood pressure stable  Mixed hyperlipidemia On Lipitor, takes 20 mg daily, likes to cut them in half Numbers at goal  Chest pain, unspecified type No further chest pain Previous stress test with no ischemia Cholesterol at goal  DOT clearance Paperwork reviewed, completed, given back to him today  Disposition:   F/U  12 months   Total encounter time more than 25 minutes  Greater than 50% was spent in counseling and coordination of care with the patient    Orders Placed This Encounter  Procedures  . EKG 12-Lead     Signed, Esmond Plants, M.D., Ph.D. 07/05/2019  [facility] [place], [place] [phone]

## 2019-07-05 ENCOUNTER — Ambulatory Visit (INDEPENDENT_AMBULATORY_CARE_PROVIDER_SITE_OTHER): Payer: PPO | Admitting: Cardiovascular Disease

## 2019-07-05 ENCOUNTER — Encounter: Payer: Self-pay | Admitting: Cardiovascular Disease

## 2019-07-05 ENCOUNTER — Other Ambulatory Visit: Payer: Self-pay

## 2019-07-05 VITALS — BP 122/72 | HR 60 | Ht 70.0 in | Wt 181.2 lb

## 2019-07-05 DIAGNOSIS — I480 Paroxysmal atrial fibrillation: Secondary | ICD-10-CM | POA: Diagnosis not present

## 2019-07-05 DIAGNOSIS — N179 Acute kidney failure, unspecified: Secondary | ICD-10-CM | POA: Diagnosis not present

## 2019-07-05 DIAGNOSIS — I10 Essential (primary) hypertension: Secondary | ICD-10-CM | POA: Diagnosis not present

## 2019-07-05 DIAGNOSIS — E782 Mixed hyperlipidemia: Secondary | ICD-10-CM

## 2019-07-05 DIAGNOSIS — I351 Nonrheumatic aortic (valve) insufficiency: Secondary | ICD-10-CM | POA: Diagnosis not present

## 2019-07-05 DIAGNOSIS — R0600 Dyspnea, unspecified: Secondary | ICD-10-CM | POA: Diagnosis not present

## 2019-07-05 DIAGNOSIS — R079 Chest pain, unspecified: Secondary | ICD-10-CM | POA: Diagnosis not present

## 2019-07-05 MED ORDER — METOPROLOL SUCCINATE ER 50 MG PO TB24: 25.0000 mg | ORAL_TABLET | Freq: Every day | ORAL | 3 refills | Status: DC

## 2019-07-05 NOTE — Patient Instructions (Signed)
Medication Instructions:  No changes  *If you need a refill on your cardiac medications before your next appointment, please call your pharmacy*   Lab Work: None  If you have labs (blood work) drawn today and your tests are completely normal, you will receive your results only by: Marland Kitchen MyChart Message (if you have MyChart) OR . A paper copy in the mail If you have any lab test that is abnormal or we need to change your treatment, we will call you to review the results.   Testing/Procedures: None   Follow-Up: At [facility], you and your health needs are our priority.  As part of our continuing mission to provide you with exceptional heart care, we have created designated Provider Care Teams.  These Care Teams include your primary Cardiologist (physician) and Advanced Practice Providers (APPs -  Physician Assistants and Nurse Practitioners) who all work together to provide you with the care you need, when you need it.  We recommend signing up for the patient portal called "MyChart".  Sign up information is provided on this After Visit Summary.  MyChart is used to connect with patients for Virtual Visits (Telemedicine).  Patients are able to view lab/test results, encounter notes, upcoming appointments, etc.  Non-urgent messages can be sent to your provider as well.   To learn more about what you can do with MyChart, go to [email or web address].    Your next appointment:   12 month(s)  The format for your next appointment:   In Person  Provider:    You may see Ida Rogue, MD or one of the following Advanced Practice Providers on your designated Care Team:    Murray Hodgkins, NP  Christell Faith, PA-C  Marrianne Mood, PA-C

## 2019-08-19 DIAGNOSIS — X32XXXA Exposure to sunlight, initial encounter: Secondary | ICD-10-CM | POA: Diagnosis not present

## 2019-08-19 DIAGNOSIS — D2262 Melanocytic nevi of left upper limb, including shoulder: Secondary | ICD-10-CM | POA: Diagnosis not present

## 2019-08-19 DIAGNOSIS — L570 Actinic keratosis: Secondary | ICD-10-CM | POA: Diagnosis not present

## 2019-08-19 DIAGNOSIS — D2261 Melanocytic nevi of right upper limb, including shoulder: Secondary | ICD-10-CM | POA: Diagnosis not present

## 2019-08-19 DIAGNOSIS — D485 Neoplasm of uncertain behavior of skin: Secondary | ICD-10-CM | POA: Diagnosis not present

## 2019-08-19 DIAGNOSIS — L821 Other seborrheic keratosis: Secondary | ICD-10-CM | POA: Diagnosis not present

## 2019-08-19 DIAGNOSIS — C44319 Basal cell carcinoma of skin of other parts of face: Secondary | ICD-10-CM | POA: Diagnosis not present

## 2019-08-19 DIAGNOSIS — D225 Melanocytic nevi of trunk: Secondary | ICD-10-CM | POA: Diagnosis not present

## 2019-08-19 DIAGNOSIS — Z85828 Personal history of other malignant neoplasm of skin: Secondary | ICD-10-CM | POA: Diagnosis not present

## 2019-09-29 DIAGNOSIS — C44319 Basal cell carcinoma of skin of other parts of face: Secondary | ICD-10-CM | POA: Diagnosis not present

## 2019-09-30 ENCOUNTER — Encounter: Payer: Self-pay | Admitting: Family Medicine

## 2019-09-30 ENCOUNTER — Ambulatory Visit (INDEPENDENT_AMBULATORY_CARE_PROVIDER_SITE_OTHER): Payer: PPO | Admitting: Family Medicine

## 2019-09-30 ENCOUNTER — Other Ambulatory Visit: Payer: Self-pay

## 2019-09-30 VITALS — BP 122/60 | HR 63 | Temp 97.8°F | Wt 181.0 lb

## 2019-09-30 DIAGNOSIS — E7800 Pure hypercholesterolemia, unspecified: Secondary | ICD-10-CM | POA: Diagnosis not present

## 2019-09-30 DIAGNOSIS — I480 Paroxysmal atrial fibrillation: Secondary | ICD-10-CM | POA: Diagnosis not present

## 2019-09-30 DIAGNOSIS — M791 Myalgia, unspecified site: Secondary | ICD-10-CM | POA: Diagnosis not present

## 2019-09-30 DIAGNOSIS — E782 Mixed hyperlipidemia: Secondary | ICD-10-CM

## 2019-09-30 DIAGNOSIS — I10 Essential (primary) hypertension: Secondary | ICD-10-CM | POA: Diagnosis not present

## 2019-09-30 MED ORDER — METOPROLOL SUCCINATE ER 50 MG PO TB24: 25.0000 mg | ORAL_TABLET | Freq: Every day | ORAL | 1 refills | Status: DC

## 2019-09-30 MED ORDER — LISINOPRIL 40 MG PO TABS: 40.0000 mg | ORAL_TABLET | Freq: Every day | ORAL | 1 refills | Status: DC

## 2019-09-30 MED ORDER — ATORVASTATIN CALCIUM 40 MG PO TABS: 20.0000 mg | ORAL_TABLET | Freq: Every day | ORAL | 1 refills | Status: DC

## 2019-09-30 NOTE — Assessment & Plan Note (Signed)
HR under good control. Continue xarleto. Continue to monitor. Call with any concerns.

## 2019-09-30 NOTE — Assessment & Plan Note (Signed)
Will hold cholesterol medicine for 1 week and let us know if his symptoms improve. If not, restart. If they do, consider change to crestor.

## 2019-09-30 NOTE — Progress Notes (Signed)
BP (!) 122/60 (BP Location: Left Arm, Cuff Size: Normal)   Pulse 63   Temp 97.8 F (36.6 C) (Oral)   Wt 181 lb (82.1 kg)   SpO2 97%   BMI 25.97 kg/m    Subjective:    Patient ID: Neil Bush, male    DOB: 1950/05/01, 69 y.o.   MRN: [id number]  HPI: Neil Bush is a 69 y.o. male  Chief Complaint  Patient presents with  . Hypertension  . Hyperlipidemia  . Joint Pain    possibly due to atorvastatin.   HYPERTENSION / HYPERLIPIDEMIA Satisfied with current treatment? yes Duration of hypertension: chronic BP monitoring frequency: not checking BP range: 130s/70s BP medication side effects: no Past BP meds: lisinopril, metoprolol Duration of hyperlipidemia: chronic Cholesterol medication side effects: yes- back and elbow pain Cholesterol supplements: none Past cholesterol medications: atorvastatin Medication compliance: excellent compliance Aspirin: no Recent stressors: no Recurrent headaches: no Visual changes: no Palpitations: no Dyspnea: no Chest pain: no Lower extremity edema: no Dizzy/lightheaded: no  Relevant past medical, surgical, family and social history reviewed and updated as indicated. Interim medical history since our last visit reviewed. Allergies and medications reviewed and updated.  Review of Systems  Constitutional: Negative.   Respiratory: Negative.   Cardiovascular: Negative.   Gastrointestinal: Negative.   Musculoskeletal: Positive for arthralgias, back pain and myalgias. Negative for gait problem, joint swelling, neck pain and neck stiffness.  Skin: Negative.   Psychiatric/Behavioral: Negative.     Per HPI unless specifically indicated above     Objective:    BP (!) 122/60 (BP Location: Left Arm, Cuff Size: Normal)   Pulse 63   Temp 97.8 F (36.6 C) (Oral)   Wt 181 lb (82.1 kg)   SpO2 97%   BMI 25.97 kg/m   Wt Readings from Last 3 Encounters:  09/30/19 181 lb (82.1 kg)  07/05/19 181 lb 4 oz (82.2 kg)  04/04/19 181 lb 4 oz  (82.2 kg)    Physical Exam Vitals and nursing note reviewed.  Constitutional:      General: He is not in acute distress.    Appearance: Normal appearance. He is not ill-appearing, toxic-appearing or diaphoretic.  HENT:     Head: Normocephalic and atraumatic.     Right Ear: External ear normal.     Left Ear: External ear normal.     Nose: Nose normal.     Mouth/Throat:     Mouth: Mucous membranes are moist.     Pharynx: Oropharynx is clear.  Eyes:     General: No scleral icterus.       Right eye: No discharge.        Left eye: No discharge.     Extraocular Movements: Extraocular movements intact.     Conjunctiva/sclera: Conjunctivae normal.     Pupils: Pupils are equal, round, and reactive to light.  Cardiovascular:     Rate and Rhythm: Normal rate and regular rhythm.     Pulses: Normal pulses.     Heart sounds: Normal heart sounds. No murmur heard.  No friction rub. No gallop.   Pulmonary:     Effort: Pulmonary effort is normal. No respiratory distress.     Breath sounds: Normal breath sounds. No stridor. No wheezing, rhonchi or rales.  Chest:     Chest wall: No tenderness.  Musculoskeletal:        General: Normal range of motion.     Cervical back: Normal range of motion and neck supple.  Skin:    General: Skin is warm and dry.     Capillary Refill: Capillary refill takes less than 2 seconds.     Coloration: Skin is not jaundiced or pale.     Findings: No bruising, erythema, lesion or rash.  Neurological:     General: No focal deficit present.     Mental Status: He is alert and oriented to person, place, and time. Mental status is at baseline.  Psychiatric:        Mood and Affect: Mood normal.        Behavior: Behavior normal.        Thought Content: Thought content normal.        Judgment: Judgment normal.     Results for orders placed or performed in visit on 03/31/19  Microscopic Examination   URINE  Result Value Ref Range   WBC, UA None seen 0 - 5 /hpf    RBC 0-2 0 - 2 /hpf   Epithelial Cells (non renal) 0-10 0 - 10 /hpf   Bacteria, UA None seen None seen/Few  CBC with Differential/Platelet  Result Value Ref Range   WBC 8.5 3.4 - 10.8 x10E3/uL   RBC 4.92 4.14 - 5.80 x10E6/uL   Hemoglobin 16.3 13.0 - 17.7 g/dL   Hematocrit 45.2 37.5 - 51.0 %   MCV 92 79 - 97 fL   MCH 33.1 (H) 26.6 - 33.0 pg   MCHC 36.1 (H) 31 - 35 g/dL   RDW 12.7 11.6 - 15.4 %   Platelets 246 150 - 450 x10E3/uL   Neutrophils 63 Not Estab. %   Lymphs 28 Not Estab. %   Monocytes 6 Not Estab. %   Eos 2 Not Estab. %   Basos 1 Not Estab. %   Neutrophils Absolute 5.4 1 - 7 x10E3/uL   Lymphocytes Absolute 2.4 0 - 3 x10E3/uL   Monocytes Absolute 0.5 0 - 0 x10E3/uL   EOS (ABSOLUTE) 0.2 0.0 - 0.4 x10E3/uL   Basophils Absolute 0.1 0 - 0 x10E3/uL   Immature Granulocytes 0 Not Estab. %   Immature Grans (Abs) 0.0 0.0 - 0.1 x10E3/uL  Comprehensive metabolic panel  Result Value Ref Range   Glucose 82 65 - 99 mg/dL   BUN 23 8 - 27 mg/dL   Creatinine, Ser 1.29 (H) 0.76 - 1.27 mg/dL   GFR calc non Af Amer 56 (L) >59 mL/min/1.73   GFR calc Af Amer 65 >59 mL/min/1.73   BUN/Creatinine Ratio 18 10 - 24   Sodium 140 134 - 144 mmol/L   Potassium 4.1 3.5 - 5.2 mmol/L   Chloride 99 96 - 106 mmol/L   CO2 26 20 - 29 mmol/L   Calcium 9.3 8.6 - 10.2 mg/dL   Total Protein 6.7 6.0 - 8.5 g/dL   Albumin 4.5 3.8 - 4.8 g/dL   Globulin, Total 2.2 1.5 - 4.5 g/dL   Albumin/Globulin Ratio 2.0 1.2 - 2.2   Bilirubin Total 0.8 0.0 - 1.2 mg/dL   Alkaline Phosphatase 82 39 - 117 IU/L   AST 21 0 - 40 IU/L   ALT 23 0 - 44 IU/L  Lipid Panel w/o Chol/HDL Ratio  Result Value Ref Range   Cholesterol, Total 153 100 - 199 mg/dL   Triglycerides 192 (H) 0 - 149 mg/dL   HDL 43 >39 mg/dL   VLDL Cholesterol Cal 32 5 - 40 mg/dL   LDL Chol Calc (NIH) 78 0 - 99 mg/dL  Microalbumin, Urine Waived  Result  Value Ref Range   Microalb, Ur Waived 30 (H) 0 - 19 mg/L   Creatinine, Urine Waived 300 10 - 300 mg/dL    Microalb/Creat Ratio <30 <30 mg/g  PSA  Result Value Ref Range   Prostate Specific Ag, Serum 0.9 0.0 - 4.0 ng/mL  TSH  Result Value Ref Range   TSH 1.780 0.450 - 4.500 uIU/mL  UA/M w/rflx Culture, Routine   Specimen: Urine   URINE  Result Value Ref Range   Specific Gravity, UA >1.030 (H) 1.005 - 1.030   pH, UA 5.0 5.0 - 7.5   Color, UA Yellow Yellow   Appearance Ur Clear Clear   Leukocytes,UA Negative Negative   Protein,UA Negative Negative/Trace   Glucose, UA Negative Negative   Ketones, UA Negative Negative   RBC, UA Trace (A) Negative   Bilirubin, UA Negative Negative   Urobilinogen, Ur 0.2 0.2 - 1.0 mg/dL   Nitrite, UA Negative Negative   Microscopic Examination See below:       Assessment & Plan:   Problem List Items Addressed This Visit      Cardiovascular and Mediastinum   Essential hypertension    Under good control on current regimen. Continue current regimen. Continue to monitor. Call with any concerns. Refills given. Labs drawn today.       Relevant Medications   atorvastatin (LIPITOR) 40 MG tablet   lisinopril (ZESTRIL) 40 MG tablet   metoprolol succinate (TOPROL-XL) 50 MG 24 hr tablet   Other Relevant Orders   Comprehensive metabolic panel   Paroxysmal atrial fibrillation (HCC) - Primary    HR under good control. Continue xarleto. Continue to monitor. Call with any concerns.       Relevant Medications   atorvastatin (LIPITOR) 40 MG tablet   lisinopril (ZESTRIL) 40 MG tablet   metoprolol succinate (TOPROL-XL) 50 MG 24 hr tablet   Other Relevant Orders   Comprehensive metabolic panel   CBC with Differential/Platelet     Other   Hyperlipidemia    Will hold cholesterol medicine for 1 week and let us know if his symptoms improve. If not, restart. If they do, consider change to crestor.      Relevant Medications   atorvastatin (LIPITOR) 40 MG tablet   lisinopril (ZESTRIL) 40 MG tablet   metoprolol succinate (TOPROL-XL) 50 MG 24 hr tablet   Other  Relevant Orders   Comprehensive metabolic panel   Lipid Panel w/o Chol/HDL Ratio    Other Visit Diagnoses    Myalgia       Will hold cholesterol medicine for 1 week and let us know if his symptoms improve. If not, restart. If they do, consider change to crestor.        Follow up plan: Return in about 6 months (around 04/01/2020) for Physical.

## 2019-09-30 NOTE — Assessment & Plan Note (Signed)
Under good control on current regimen. Continue current regimen. Continue to monitor. Call with any concerns. Refills given. Labs drawn today.   

## 2019-10-01 LAB — COMPREHENSIVE METABOLIC PANEL
ALT: 14 IU/L (ref 0–44)
AST: 15 IU/L (ref 0–40)
Albumin/Globulin Ratio: 1.8 (ref 1.2–2.2)
Albumin: 4.2 g/dL (ref 3.7–4.7)
Alkaline Phosphatase: 88 IU/L (ref 48–121)
BUN/Creatinine Ratio: 19 (ref 10–24)
BUN: 22 mg/dL (ref 8–27)
Bilirubin Total: 0.6 mg/dL (ref 0.0–1.2)
CO2: 25 mmol/L (ref 20–29)
Calcium: 8.7 mg/dL (ref 8.6–10.2)
Chloride: 101 mmol/L (ref 96–106)
Creatinine, Ser: 1.14 mg/dL (ref 0.76–1.27)
GFR calc Af Amer: 74 mL/min/{1.73_m2} (ref >59)
GFR calc non Af Amer: 64 mL/min/{1.73_m2} (ref >59)
Globulin, Total: 2.4 g/dL (ref 1.5–4.5)
Glucose: 126 mg/dL — ABNORMAL HIGH (ref 65–99)
Potassium: 4.4 mmol/L (ref 3.5–5.2)
Sodium: 144 mmol/L (ref 134–144)
Total Protein: 6.6 g/dL (ref 6.0–8.5)

## 2019-10-01 LAB — CBC WITH DIFFERENTIAL/PLATELET
Basophils Absolute: 0 10*3/uL (ref 0.0–0.2)
Basos: 1 %
EOS (ABSOLUTE): 0.1 10*3/uL (ref 0.0–0.4)
Eos: 2 %
Hematocrit: 46.6 % (ref 37.5–51.0)
Hemoglobin: 16.3 g/dL (ref 13.0–17.7)
Immature Grans (Abs): 0 10*3/uL (ref 0.0–0.1)
Immature Granulocytes: 1 %
Lymphocytes Absolute: 1.7 10*3/uL (ref 0.7–3.1)
Lymphs: 26 %
MCH: 32.8 pg (ref 26.6–33.0)
MCHC: 35 g/dL (ref 31.5–35.7)
MCV: 94 fL (ref 79–97)
Monocytes Absolute: 0.4 10*3/uL (ref 0.1–0.9)
Monocytes: 6 %
Neutrophils Absolute: 4.1 10*3/uL (ref 1.4–7.0)
Neutrophils: 64 %
Platelets: 210 10*3/uL (ref 150–450)
RBC: 4.97 x10E6/uL (ref 4.14–5.80)
RDW: 13 % (ref 11.6–15.4)
WBC: 6.4 10*3/uL (ref 3.4–10.8)

## 2019-10-01 LAB — LIPID PANEL W/O CHOL/HDL RATIO
Cholesterol, Total: 143 mg/dL (ref 100–199)
HDL: 38 mg/dL — ABNORMAL LOW (ref >39)
LDL Chol Calc (NIH): 76 mg/dL (ref 0–99)
Triglycerides: 167 mg/dL — ABNORMAL HIGH (ref 0–149)
VLDL Cholesterol Cal: 29 mg/dL (ref 5–40)

## 2019-10-02 ENCOUNTER — Encounter: Payer: Self-pay | Admitting: Family Medicine

## 2019-11-30 ENCOUNTER — Ambulatory Visit: Payer: Self-pay | Admitting: *Deleted

## 2019-11-30 NOTE — Telephone Encounter (Signed)
  Reason for Disposition . [1] MODERATE headache (e.g., interferes with normal activities) AND [2] present > 24 hours AND [3] unexplained  (Exceptions: analgesics not tried, typical migraine, or headache part of viral illness)  Answer Assessment - Initial Assessment Questions 1. LOCATION: "Where does it hurt?"      Back of head 2. ONSET: "When did the headache start?" (Minutes, hours or days)     hours 3. PATTERN: "Does the pain come and go, or has it been constant since it started?"    Intermittent last episodes 11/28/19 and 11/29/19 4. SEVERITY: "How bad is the pain?" and "What does it keep you from doing?"  (e.g., Scale 1-10; mild, moderate, or severe)   - MILD (1-3): doesn't interfere with normal activities    - MODERATE (4-7): interferes with normal activities or awakens from sleep    - SEVERE (8-10): excruciating pain, unable to do any normal activities        3-4 out of 10 5. RECURRENT SYMPTOM: "Have you ever had headaches before?" If Yes, ask: "When was the last time?" and "What happened that time?"     Yes history of migraines 6. CAUSE: "What do you think is causing the headache?"     migraine 7. MIGRAINE: "Have you been diagnosed with migraine headaches?" If Yes, ask: "Is this headache similar?"     Yes, these headache preceded by blind spot in right peripheral vision 8. HEAD INJURY: "Has there been any recent injury to the head?"      no 9. OTHER SYMPTOMS: "Do you have any other symptoms?" (fever, stiff neck, eye pain, sore throat, cold symptoms)    Dizziness when moves head too quickly 10. PREGNANCY: "Is there any chance you are pregnant?" "When was your last menstrual period?"       n/a  Protocols used: HEADACHE-A-AH

## 2019-11-30 NOTE — Telephone Encounter (Addendum)
Pt called with complaints of dull headaches for the past 1.5 weeks; the pt says that 5 had 5 episodes in the past week with the last 2 on 11/28/19 and 11/29/19; he says the headaches are preceded with "a blind spot" loss of his right peripheral vision; the blind spot lasts about 20 minutes; he pt states his headache is in the lower back of his head; he says rates his pain at 3-4 out of 10; the pt states Tylenol does not relieve the pain but sitting in a dark room helps; the pt also states he has dizziness when he moves too quickly; recommendations made per nurse triage protocol; he verbalized understanding; He is seen by [facility]; spoke with Laural Roes regarding scheduling pt; per Laural Roes pt offered and accepted appt with Kathrine Haddock, 12/01/19 at 1600; will route to office for notification of encounter.

## 2019-12-01 ENCOUNTER — Ambulatory Visit (INDEPENDENT_AMBULATORY_CARE_PROVIDER_SITE_OTHER): Payer: PPO | Admitting: Unknown Physician Specialty

## 2019-12-01 ENCOUNTER — Other Ambulatory Visit: Payer: Self-pay

## 2019-12-01 ENCOUNTER — Encounter: Payer: Self-pay | Admitting: Unknown Physician Specialty

## 2019-12-01 DIAGNOSIS — G43109 Migraine with aura, not intractable, without status migrainosus: Secondary | ICD-10-CM | POA: Insufficient documentation

## 2019-12-01 NOTE — Assessment & Plan Note (Signed)
Long standing problem with increased number of 5 in the last 2 week and normally having 3/year.  Discussed adding Magnesium 500 mg BID.  Avoid artificial sweeteners.  Make appt with eye professional.  CT due to age and increased frequency.

## 2019-12-01 NOTE — Progress Notes (Signed)
BP (!) 143/87 (BP Location: Left Arm, Patient Position: Sitting, Cuff Size: Normal)   Pulse 62   Temp 99 F (37.2 C) (Oral)   Ht 5\' 10"  (1.778 m)   Wt 180 lb 12.8 oz (82 kg)   SpO2 96%   BMI 25.94 kg/m    Subjective:    Patient ID: Neil Bush, male    DOB: [birth year], 69 y.o.   MRN: [id number]  HPI: NASH BOLLS is a 69 y.o. male  Chief Complaint  Patient presents with  . Migraine   Pt states he gets 3 migraines/year.  States he had one 2 weeks ago another a day later and in this span of 2 weeks he total of 5.  Usually first notes an aura of a blind spot for about 20 minutes with eye redness.  The headache is not that bad but notices head sore the next day. In general he is light sensitive all the time.  No nausea or vomiting.  This is consistent with the migraines he has had all his life.  No diet or lifestyle changes.  No change of sleeping habits and consistent amount of caffeine.     Relevant past medical, surgical, family and social history reviewed and updated as indicated. Interim medical history since our last visit reviewed. Allergies and medications reviewed and updated.  Review of Systems  Per HPI unless specifically indicated above     Objective:    BP (!) 143/87 (BP Location: Left Arm, Patient Position: Sitting, Cuff Size: Normal)   Pulse 62   Temp 99 F (37.2 C) (Oral)   Ht 5\' 10"  (1.778 m)   Wt 180 lb 12.8 oz (82 kg)   SpO2 96%   BMI 25.94 kg/m   Wt Readings from Last 3 Encounters:  12/01/19 180 lb 12.8 oz (82 kg)  09/30/19 181 lb (82.1 kg)  07/05/19 181 lb 4 oz (82.2 kg)    Physical Exam Constitutional:      General: He is not in acute distress.    Appearance: Normal appearance. He is well-developed.  HENT:     Head: Normocephalic and atraumatic.  Eyes:     General: Lids are normal. No scleral icterus.       Right eye: No discharge.        Left eye: No discharge.     Conjunctiva/sclera: Conjunctivae normal.  Neck:     Vascular: No  carotid bruit or JVD.  Cardiovascular:     Rate and Rhythm: Normal rate and regular rhythm.     Heart sounds: Normal heart sounds.  Pulmonary:     Effort: Pulmonary effort is normal. No respiratory distress.     Breath sounds: Normal breath sounds.  Abdominal:     Palpations: There is no hepatomegaly or splenomegaly.  Musculoskeletal:        General: Normal range of motion.     Cervical back: Normal range of motion and neck supple.  Skin:    General: Skin is warm and dry.     Coloration: Skin is not pale.     Findings: No rash.  Neurological:     Mental Status: He is alert and oriented to person, place, and time.     Cranial Nerves: Cranial nerves are intact.  Psychiatric:        Behavior: Behavior normal.        Thought Content: Thought content normal.        Judgment: Judgment normal.  Assessment & Plan:   Problem List Items Addressed This Visit      Unprioritized   Migraine with aura    Long standing problem with increased number of 5 in the last 2 week and normally having 3/year.  Discussed adding Magnesium 500 mg BID.  Avoid artificial sweeteners.  Make appt with eye professional.  CT due to age and increased frequency.        Relevant Orders   CT Head Wo Contrast        Follow up plan: Return if symptoms worsen or fail to improve.

## 2019-12-07 ENCOUNTER — Ambulatory Visit
Admission: RE | Admit: 2019-12-07 | Discharge: 2019-12-07 | Disposition: A | Discharge status: Home / Self Care | Payer: PPO | Source: Ambulatory Visit | Attending: Unknown Physician Specialty | Admitting: Unknown Physician Specialty

## 2019-12-07 ENCOUNTER — Other Ambulatory Visit: Payer: Self-pay

## 2019-12-07 DIAGNOSIS — G43109 Migraine with aura, not intractable, without status migrainosus: Secondary | ICD-10-CM | POA: Diagnosis not present

## 2019-12-07 DIAGNOSIS — R519 Headache, unspecified: Secondary | ICD-10-CM | POA: Diagnosis not present

## 2019-12-08 ENCOUNTER — Encounter: Payer: Self-pay | Admitting: Physician Assistant

## 2019-12-08 ENCOUNTER — Other Ambulatory Visit: Payer: Self-pay

## 2019-12-08 ENCOUNTER — Ambulatory Visit: Payer: PPO | Admitting: Physician Assistant

## 2019-12-08 VITALS — BP 126/82 | HR 61 | Ht 70.0 in | Wt 179.0 lb

## 2019-12-08 DIAGNOSIS — I480 Paroxysmal atrial fibrillation: Secondary | ICD-10-CM | POA: Diagnosis not present

## 2019-12-08 DIAGNOSIS — E785 Hyperlipidemia, unspecified: Secondary | ICD-10-CM | POA: Diagnosis not present

## 2019-12-08 DIAGNOSIS — R079 Chest pain, unspecified: Secondary | ICD-10-CM

## 2019-12-08 DIAGNOSIS — I10 Essential (primary) hypertension: Secondary | ICD-10-CM

## 2019-12-08 DIAGNOSIS — R0600 Dyspnea, unspecified: Secondary | ICD-10-CM

## 2019-12-08 DIAGNOSIS — I351 Nonrheumatic aortic (valve) insufficiency: Secondary | ICD-10-CM | POA: Diagnosis not present

## 2019-12-08 MED ORDER — METOPROLOL SUCCINATE ER 50 MG PO TB24: 25.0000 mg | ORAL_TABLET | Freq: Every day | ORAL | 3 refills | Status: DC

## 2019-12-08 NOTE — Patient Instructions (Signed)
Medication Instructions:  No changes  *If you need a refill on your cardiac medications before your next appointment, please call your pharmacy*   Lab Work: None  If you have labs (blood work) drawn today and your tests are completely normal, you will receive your results only by: Marland Kitchen MyChart Message (if you have MyChart) OR . A paper copy in the mail If you have any lab test that is abnormal or we need to change your treatment, we will call you to review the results.   Testing/Procedures: Call me when you want to schedule and I will put order in for you to get scheduled.  We will order CT coronary calcium score $154.42 at our [facility] in [place]   Please call [phone] to schedule [facility] or [facility] [place], [place] [address]   Follow-Up: At [facility], you and your health needs are our priority.  As part of our continuing mission to provide you with exceptional heart care, we have created designated Provider Care Teams.  These Care Teams include your primary Cardiologist (physician) and Advanced Practice Providers (APPs -  Physician Assistants and Nurse Practitioners) who all work together to provide you with the care you need, when you need it.  We recommend signing up for the patient portal called "MyChart".  Sign up information is provided on this After Visit Summary.  MyChart is used to connect with patients for Virtual Visits (Telemedicine).  Patients are able to view lab/test results, encounter notes, upcoming appointments, etc.  Non-urgent messages can be sent to your provider as well.   To learn more about what you can do with MyChart, go to [email or web address].    Your next appointment:   6 month(s)  The format for your next appointment:   In Person  Provider:   You may see Ida Rogue, MD or one of the following Advanced Practice Providers on your designated Care Team:     Murray Hodgkins, NP  Christell Faith, PA-C  Marrianne Mood, PA-C  Cadence Kathlen Mody, [place]   Medication Samples have been provided to the patient.  Drug name: Xarelto        Strength: 20 mg         Qty: 2 bottles   LOT: [id number]  Exp.Date: 08/23

## 2019-12-08 NOTE — Progress Notes (Signed)
Office Visit    Patient Name: Neil Bush Date of Encounter: 12/08/2019  Primary Care Provider:  Guadalupe Maple, MD Primary Cardiologist:  Ida Rogue, MD  Chief Complaint    Chief Complaint  Patient presents with  . Other    patient c/o SOB and chest pain - Did take an Acid reflux tablet and felt better. Meds reviewed verbally with patient.    69 year old male with history of PAF, hypertension, hyperlipidemia, and chest pain, and who presents for follow-up with report of recent shortness of breath and chest tightness.  Past Medical History    Past Medical History:  Diagnosis Date  . Chest pain    a. 01/2016 in setting of Afib;  b. 02/2016 Ex MV: EF 55-65%, no ischemia.  Marland Kitchen Dysrhythmia   . Headache   . Hyperlipidemia   . Hypertension   . PAF (paroxysmal atrial fibrillation) ([facility])    a. 01/2016 - admission for rapid AF and c/p-->converted on IV dilt;  b. CHA2DS2VASc = 2-->Xarelto;  c. 01/2016 Echo: EF 55-60%, triv AI.  Marland Kitchen Palpitations    a. 08/2016 Zio Patch: symptomatic PAC's and brief (max 11 beats) of SVT/PAT (3 episodes).  . Skin cancer    Past Surgical History:  Procedure Laterality Date  . CARDIAC CATHETERIZATION     [facility]   . CHOLECYSTECTOMY    . COLONOSCOPY WITH PROPOFOL N/A 04/17/2017   Procedure: COLONOSCOPY WITH PROPOFOL;  Surgeon: Lucilla Lame, MD;  Location: [facility];  Service: Endoscopy;  Laterality: N/A;  . EYE SURGERY    . FOOT SURGERY    . HERNIA REPAIR     X 2 ingunial  . KNEE SURGERY Right   . POLYPECTOMY  04/17/2017   Procedure: POLYPECTOMY INTESTINAL;  Surgeon: Lucilla Lame, MD;  Location: [facility];  Service: Endoscopy;;    Allergies  Allergies  Allergen Reactions  . Penicillins Rash    Has patient had a PCN reaction causing immediate rash, facial/tongue/throat swelling, SOB or lightheadedness with hypotension: no Has patient had a PCN reaction causing severe rash involving mucus membranes or skin necrosis:  no Has patient had a PCN reaction that required hospitalization no Has patient had a PCN reaction occurring within the last 10 years: no If all of the above answers are "NO", then may proceed with Cephalosporin use.    Childhood allergy    History of Present Illness    Neil Bush is a 69 y.o. male with PMH as above.  69 year old male with history of PAF, hypertension, hyperlipidemia, and chest pain, and who presents for follow-up.  He was previously followed by Dr. Farrel Conners this subsequently transition to Dr. Rockey Situ.    His atrial fibrillation was diagnosed 01/2016 during admission for chest pain and atrial fibrillation.  He converted to sinus rhythm with IV diltiazem.  He has been anticoagulated with Xarelto in the setting of CHA2DS2-VASc of 2 (hypertension, age x1).  Echo 01/2016 showed EF 55 to 60%, normal LV diastolic function, trivial AI.    Follow-up stress test 02/2016 ruled low risk without evidence of ischemia.    08/2016, he noted intermittent palpitations with Zio patch showing NSR with symptomatic PACs and 3 short runs of SVT/atrial tachycardia with maximum interval 11 beats.  No evidence of recurrent atrial fibrillation.  With addition of short acting diltiazem, he noted improvement palpitations.    He was seen in the ED 09/2018 with chest pain and shortness of breath with recurrent atrial fibrillation and  ventricular rates into the 130s.  He converted to NSR in the ED spontaneously with improvement in his symptoms.  Initial high-sensitivity troponin 99 with delta 219.    Nuclear stress test 09/2018 showed no significant ischemia with EF 42%, felt likely to be depressed secondary to GI uptake and ruled overall low risk study.  He was seen in the ED 12/2018 with high blood pressure and intermittent headache.  BP 188/96.  EKG showed SB with no acute ST/T changes.  Lisinopril was titrated with recommendation to follow-up as an outpatient.    He was seen by PCP 03/31/2019 with BP 138/79  and heart rate 59 bpm.  He was started on HCTZ 12.5 mg daily.  He was last seen by his primary cardiologist 07/05/2019.  At that time, he reported short runs of atrial fibrillation approximately 3 weeks earlier.  This reportedly self resolved within 5 minutes.  He sometimes will take an extra metoprolol 25 mg x 1.  He denied any other frequent episodes of atrial fibrillation.  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  He reported good energy.  EKG NSR.  He reported cutting his Lipitor in half.  Today, 12/08/2019, he presents to clinic and reports that he is feeling well from a cardiac standpoint.  He reports an episode of chest tightness and shortness of breath that occurred when walking from his barn to his house approximately 2 weeks ago.  Chest tightness was reportedly transient and resolved with taking a deeper breath and later a Tums tablet.  He stated the chest tightness was different from previous reported chest pain that occurred when he was seen in the emergency department for recurrent atrial fibrillation.  He checked his blood pressure at that time and stated that it was not abnormal or outside of his usual range.  He is checking his blood pressure once to twice per week with SBP 1 25-1 30 and DBP 78-80.  He states that when his blood pressure is down with SBP 110s, he usually feels fairly poorly, which is the reason that he cut back on his blood pressure medication in the past.  He reports that he occasionally eats preprocessed foods, such as a TV dinner; however, this is only once in a while and not frequently.  He does not add any additional salt to his food and usually only seasons with pepper.  Approximately 6 to 8 weeks ago, he had an episode of atrial fibrillation and took an extra 1/2 tablet of Toprol-XL for 25 mg total of Toprol with resolution of his tachypalpitations.  He stated that he  took a full 50 mg Toprol XL tablet the next day, rather than his usual half tablet.  As a result, he is running low on this medication and needs refills today.  He denies any signs or symptoms of bleeding and has been compliant with Xarelto.  He does note that he is approaching his donut hole for the Xarelto and wonders if we are able to provide samples versus some assistance.  He states that his pharmacy does not have a generic for this medication.  No presyncope or syncope.  No recent falls.  He states that he remains active on his farm as previously reported.  He reports good energy.  He denies any orthopnea, PND, abdominal distention, or early satiety.  Overall, he feels he is doing well.  Home  Medications    Prior to Admission medications   Medication Sig Start Date End Date Taking? Authorizing Provider  atorvastatin (LIPITOR) 40 MG tablet Take 0.5 tablets (20 mg total) by mouth daily at 6 PM. 09/30/19   Johnson, Megan P, DO  lisinopril (ZESTRIL) 40 MG tablet Take 1 tablet (40 mg total) by mouth daily. Patient taking differently: Take 20 mg by mouth daily.  09/30/19   Johnson, Megan P, DO  metoprolol succinate (TOPROL-XL) 50 MG 24 hr tablet Take 0.5 tablets (25 mg total) by mouth daily. Take with or immediately following a meal. 09/30/19 12/29/19  Johnson, Megan P, DO  rivaroxaban (XARELTO) 20 MG TABS tablet Take 1 tablet (20 mg total) by mouth daily. 03/31/19   [name], DO    Review of Systems    He deniespnd, orthopnea, n, v, dizziness, syncope, edema, weight gain, or early satiety.  He reports an episode of chest pain 2 weeks ago that was further described as chest tightness with associated shortness of breath and resolved with deeper breathing and a Tums tablet.  Chest pain has not recurred since that time.  He reports on going symptomatic runs of atrial fibrillation, resolving with additional metoprolol as in the past.  His latest episode was 6 to 8 weeks ago and for which she took an  additional half tablet of Toprol-XL with a full 50 mg tablet taken the following day.   All other systems reviewed and are otherwise negative except as noted above.  Physical Exam    VS:  BP 126/82 (BP Location: Left Arm, Patient Position: Sitting, Cuff Size: Normal)   Pulse 61   Ht 5\' 10"  (1.778 m)   Wt 179 lb (81.2 kg)   SpO2 97%   BMI 25.68 kg/m  , BMI Body mass index is 25.68 kg/m. GEN: Well nourished, well developed, in no acute distress. HEENT: normal. Neck: Supple, no JVD, carotid bruits, or masses. Cardiac: RRR, 1/6 systolic murmur, rubs, or gallops. No clubbing, cyanosis, mild dependent edema.  Radials/DP/PT 2+ and equal bilaterally.  Respiratory:  Respirations regular and unlabored, clear to auscultation bilaterally. GI: Soft, nontender, nondistended, BS + x 4. MS: no deformity or atrophy. Skin: warm and dry, no rash. Neuro:  Strength and sensation are intact. Psych: Normal affect.  Accessory Clinical Findings    ECG personally reviewed by me today - NSR, 61 bpm, poor R wave progression inferior leads, LVH, no acute changes.  VITALS Reviewed today   Temp Readings from Last 3 Encounters:  12/01/19 99 F (37.2 C) (Oral)  09/30/19 97.8 F (36.6 C) (Oral)  03/31/19 97.7 F (36.5 C) (Oral)   BP Readings from Last 3 Encounters:  12/08/19 126/82  12/01/19 (!) 143/87  09/30/19 (!) 122/60   Pulse Readings from Last 3 Encounters:  12/08/19 61  12/01/19 62  09/30/19 63    Wt Readings from Last 3 Encounters:  12/08/19 179 lb (81.2 kg)  12/01/19 180 lb 12.8 oz (82 kg)  09/30/19 181 lb (82.1 kg)     LABS  reviewed today   Lab Results  Component Value Date   WBC 6.4 09/30/2019   HGB 16.3 09/30/2019   HCT 46.6 09/30/2019   MCV 94 09/30/2019   PLT 210 09/30/2019   Lab Results  Component Value Date   CREATININE 1.14 09/30/2019   BUN 22 09/30/2019   NA 144 09/30/2019   K 4.4 09/30/2019   CL 101 09/30/2019   CO2 25 09/30/2019   Lab  Results  Component  Value Date   ALT 14 09/30/2019   AST 15 09/30/2019   ALKPHOS 88 09/30/2019   BILITOT 0.6 09/30/2019   Lab Results  Component Value Date   CHOL 143 09/30/2019   HDL 38 (L) 09/30/2019   LDLCALC 76 09/30/2019   TRIG 167 (H) 09/30/2019   CHOLHDL 2.8 03/29/2018    Lab Results  Component Value Date   HGBA1C 5.0 10/06/2017   Lab Results  Component Value Date   TSH 1.780 03/31/2019     STUDIES/PROCEDURES reviewed today   Echo 05/12/2019 1. Left ventricular ejection fraction, by estimation, is 55 to 60%. The  left ventricle has normal function. The left ventricle has no regional  wall motion abnormalities. Left ventricular diastolic parameters are  consistent with Grade I diastolic  dysfunction (impaired relaxation).  2. Right ventricular systolic function is normal. The right ventricular  size is normal.  3. Left atrial size was mildly dilated.  4. Mild mitral valve regurgitation.  5. Aortic valve regurgitation is mild.  NM Study Pharmacological myocardial perfusion imaging study with no significant ischemia Normal wall motion, EF estimated at 42% (depressed EF likely secondary to GI uptake artifact) No EKG changes concerning for ischemia at peak stress or in recovery. Low risk scan  Event monitor 09/26/2016 Normal sinus rhythm Avg HR of 68 bpm.  3  Short Supraventricular Tachycardia runs occurred, the longest was 11 beats Rare PVC  Carotid bilateral IMPRESSION:  1. There is slight plaque formation at the carotid bifurcation on the  left.  2. No hemodynamically significant stenosis is identified on either side.  3. There is antegrade flow in both vertebrals.   Assessment & Plan   Atypical chest pain, associated dyspnea --No current chest pain.  Reports chest tightness and shortness of breath that occurred while walking from his barn and resolved on its own with a deeper breath and completely resolved with taking Tums.  On review of EMR, he has noted in the past  that he has approximately 2-3 episodes of mild chest discomfort and dyspnea that last approximately 2 to 3 minutes, followed by spontaneous resolution.  He states today that this CP is different from previous episodes.  EKG without acute ST/T changes today.  Most recent echo with normal EF and diastolic dysfunction. CP does not consistently occur with exertion. Given he has not had recurrence of this chest pain, we discussed further work-up with coronary artery calcium scan.  His preferences to think about getting a CAC score.  He will call the office if he decides to get the scan, at which time we can order it for him without a repeat visit.   He should call the office if his chest pain and shortness of breath worsen.  In the meantime, he should continue to monitor his blood pressure.  Daily weights will also be helpful, so that we can see if he is holding onto any additional fluid that may be causing his discomfort.  Right atrial pressure 3 mmHg on most recent echocardiogram.  PAF  --Maintaining NSR on EKG today without acute ST/T changes.  Reports an episode of atrial fibrillation approximately 6 to 8 weeks ago, during which time he took an extra 25 mg of his Toprol.  The following day, he took a full Toprol-XL 50 mg.  He requires refills on this medication today.  He mentioned a pocket pill, referenced by his primary cardiologist; however, he does not recall the name.  We will plan  to reach out to Dr. Rockey Situ to confirm if this is flecainide that was referrenced. Of note, he does not wish to start any additional medications at this time but was curious regarding the name of the medication that Dr. Rockey Situ had referenced.  He is tolerating his Xarelto 20 mg daily without signs or symptoms of bleeding.  He does note that he is close to his donut hole and we will look into providing him with samples or assistance to ensure he is able to continue to take his [place] as prescribed for thromboembolic risk due to  [id number] score of at least  2 (HTN, agex1).   HTN --As above, BP at home is acceptable to borderline high.  We discussed that elevated blood pressure could be contributing to his chest discomfort and dyspnea as reported above.  BP readings at home have been acceptable with SBP 125-130 and DBP 78-80.  We discussed that his BP is on the borderline high side at times; however, given he feels poorly at lower BP, preference was indicated to stay at his current dose for now. BP today 126/82.  Reassess at RTC, per pt preference.  No presyncope or syncope.  He reports normal blood pressure during his episode of chest tightness and shortness of breath that occurred approximately 2 weeks ago; however, he is unable to provide an exact reading. He will check his BP again if further episodes.  Low-sodium diet recommended.  Continue to check blood pressure and heart rate at home.  Daily weights will also assist with monitoring his volume status, though he does appear euvolemic on exam today.  Recommend total daily fluid under 2 L and total daily salt under 2 g.   Diastolic dysfunction / N4BS --Euvolemic on exam today.  Most recent echo as above with normal EF and G1DD.  Also noted was mild LAE and mild MR/AR.  Recommend periodic echo to monitor.  In addition, daily weights will help to monitor volume status.  He appears euvolemic on exam today.  Recommend BP/rate control going forward.  At RTC, we should continue to discuss his blood pressure medications to ensure he remains at or below 130/80.   Medication changes: none Studies / Imaging ordered: CAC score (he will think about getting) Disposition: RTC 6 months or sooner if needed   Arvil Chaco, PA-C 12/08/2019

## 2020-01-16 ENCOUNTER — Telehealth: Payer: Self-pay | Admitting: Cardiovascular Disease

## 2020-01-16 DIAGNOSIS — R079 Chest pain, unspecified: Secondary | ICD-10-CM

## 2020-01-16 NOTE — Telephone Encounter (Signed)
I spoke with the patient. He advised he is ready to schedule his CT Calcium Score.  I have advised him I will place this order and then he may call directly to schedule.  I did confirm with him that he still has his AVS instructions with the details of payment and phone number to call to schedule.  We will order CT coronary calcium score $154.42 at our [facility] in [place]   Please call [phone] to schedule [facility] or [facility] [place], [place] [address]   The patient voices understanding of the above instructions and is agreeable.  I have advised him he may try to call the # above this afternoon to schedule.

## 2020-01-16 NOTE — Telephone Encounter (Signed)
Patient calling  Patient is ready to schedule a CT Please review and call to discuss

## 2020-01-20 ENCOUNTER — Other Ambulatory Visit: Payer: Self-pay

## 2020-01-20 ENCOUNTER — Ambulatory Visit
Admission: RE | Admit: 2020-01-20 | Discharge: 2020-01-20 | Disposition: A | Discharge status: Home / Self Care | Payer: PPO | Source: Ambulatory Visit | Attending: Physician Assistant | Admitting: Physician Assistant

## 2020-01-20 DIAGNOSIS — R079 Chest pain, unspecified: Secondary | ICD-10-CM | POA: Diagnosis not present

## 2020-01-23 ENCOUNTER — Telehealth: Payer: Self-pay | Admitting: *Deleted

## 2020-01-23 NOTE — Telephone Encounter (Signed)
Attempted to call pt with results. LMOM TCB.  

## 2020-01-23 NOTE — Telephone Encounter (Signed)
-----   Message from Arvil Chaco, PA-C sent at 01/23/2020 11:45 AM EST ----- Good news!  Please let Mr. Eichinger know that his cardiac CT showed he was low risk for future coronary events with coronary calcium score of 0.   Very reassuring!

## 2020-01-23 NOTE — Telephone Encounter (Signed)
Pt returned call. Notified of results below. Pt has no questions at this time.

## 2020-02-02 DIAGNOSIS — L570 Actinic keratosis: Secondary | ICD-10-CM | POA: Diagnosis not present

## 2020-02-02 DIAGNOSIS — Z85828 Personal history of other malignant neoplasm of skin: Secondary | ICD-10-CM | POA: Diagnosis not present

## 2020-02-02 DIAGNOSIS — D2262 Melanocytic nevi of left upper limb, including shoulder: Secondary | ICD-10-CM | POA: Diagnosis not present

## 2020-02-02 DIAGNOSIS — L821 Other seborrheic keratosis: Secondary | ICD-10-CM | POA: Diagnosis not present

## 2020-02-02 DIAGNOSIS — D2261 Melanocytic nevi of right upper limb, including shoulder: Secondary | ICD-10-CM | POA: Diagnosis not present

## 2020-02-02 DIAGNOSIS — X32XXXA Exposure to sunlight, initial encounter: Secondary | ICD-10-CM | POA: Diagnosis not present

## 2020-02-02 DIAGNOSIS — D225 Melanocytic nevi of trunk: Secondary | ICD-10-CM | POA: Diagnosis not present

## 2020-02-20 ENCOUNTER — Other Ambulatory Visit: Payer: Self-pay

## 2020-02-20 ENCOUNTER — Ambulatory Visit: Payer: PPO | Admitting: Podiatry

## 2020-02-20 ENCOUNTER — Encounter: Payer: Self-pay | Admitting: Podiatry

## 2020-02-20 DIAGNOSIS — M792 Neuralgia and neuritis, unspecified: Secondary | ICD-10-CM

## 2020-02-20 DIAGNOSIS — L603 Nail dystrophy: Secondary | ICD-10-CM | POA: Diagnosis not present

## 2020-02-20 DIAGNOSIS — B351 Tinea unguium: Secondary | ICD-10-CM | POA: Diagnosis not present

## 2020-02-20 NOTE — Progress Notes (Signed)
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 male presents with the above complaint.   ROS: Denies fever chills nausea vomiting muscle aches pains calf pain back pain chest pain shortness of breath.  Past Medical History:  Diagnosis Date   Chest pain    a. 01/2016 in setting of Afib;  b. 02/2016 Ex MV: EF 55-65%, no ischemia.   Dysrhythmia    Headache    Hyperlipidemia    Hypertension    PAF (paroxysmal atrial fibrillation) ([facility])    a. 01/2016 - admission for rapid AF and c/p-->converted on IV dilt;  b. CHA2DS2VASc = 2-->Xarelto;  c. 01/2016 Echo: EF 55-60%, triv AI.   Palpitations    a. 08/2016 Zio Patch: symptomatic PAC's and brief (Keysha Damewood 11 beats) of SVT/PAT (3 episodes).   Skin cancer    Past Surgical History:  Procedure Laterality Date   CARDIAC CATHETERIZATION     [facility]    CHOLECYSTECTOMY     COLONOSCOPY WITH PROPOFOL N/A 04/17/2017   Procedure: COLONOSCOPY WITH PROPOFOL;  Surgeon: Midge Minium, MD;  [facility];  Service: Endoscopy;  Laterality: N/A;   EYE SURGERY     FOOT SURGERY     HERNIA REPAIR     X 2 ingunial   KNEE SURGERY Right    POLYPECTOMY  04/17/2017   Procedure: POLYPECTOMY INTESTINAL;  Surgeon: Midge Minium, MD;  Location: [facility];  Service: Endoscopy;;    Current Outpatient Medications:    atorvastatin (LIPITOR) 40 MG tablet, Take 0.5 tablets (20 mg total) by mouth daily at 6 PM., Disp: 45 tablet, Rfl: 1   lisinopril (ZESTRIL) 40 MG tablet, Take 20 mg by mouth daily., Disp: , Rfl:    metoprolol succinate (TOPROL-XL) 50 MG 24 hr tablet, Take 0.5 tablets (25 mg total) by mouth daily. Take with or  immediately following a meal., Disp: 45 tablet, Rfl: 3   rivaroxaban (XARELTO) 20 MG TABS tablet, Take 1 tablet (20 mg total) by mouth daily., Disp: 90 tablet, Rfl: 3  Allergies  Allergen Reactions   Penicillins Rash    Has patient had a PCN reaction causing immediate rash, facial/tongue/throat swelling, SOB or lightheadedness with hypotension: no Has patient had a PCN reaction causing severe rash involving mucus membranes or skin necrosis: no Has patient had a PCN reaction that required hospitalization no Has patient had a PCN reaction occurring within the last 10 years: no If all of the above answers are "NO", then may proceed with Cephalosporin use.    Childhood allergy   Review of Systems Objective:  There were no vitals filed for this visit.  General: Well developed, nourished, in no acute distress, alert and oriented x3   Dermatological: Skin is warm, dry and supple bilateral. Nails x 10 are well maintained; remaining integument appears unremarkable at this time. There are no open sores, no preulcerative lesions, no rash or signs of infection present.  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  Vascular: Dorsalis Pedis artery and Posterior Tibial artery pedal pulses are 2/4 bilateral with immedate capillary fill time. Pedal hair growth present.  No varicosities and no lower extremity edema present bilateral.   Neruologic: Grossly intact via light touch bilateral. Vibratory intact via tuning fork bilateral. Protective threshold with Semmes Wienstein monofilament intact to all pedal sites bilateral. Patellar and Achilles deep tendon reflexes 2+ bilateral. No Babinski or clonus noted bilateral.  No reproducible pain on intermittent burning or tingling.  Musculoskeletal: No gross boney pedal deformities bilateral. No pain, crepitus, or limitation noted with foot and ankle range of motion bilateral. Muscular strength 5/5 in all groups tested bilateral.  Gait:  Unassisted, Nonantalgic.    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  Plan: Took samples of the skin nail to be sent for pathologic evaluation we will follow-up with him in 1 month     Caraline Deutschman T. [place], [place]

## 2020-03-21 ENCOUNTER — Encounter: Payer: Self-pay | Admitting: *Deleted

## 2020-03-21 ENCOUNTER — Encounter: Payer: PPO | Admitting: Podiatry

## 2020-04-02 ENCOUNTER — Telehealth: Payer: Self-pay | Admitting: Family Medicine

## 2020-04-02 NOTE — Telephone Encounter (Signed)
PTs wife Neil Bush called back stating she had a missed call. Once informed it was for her husband she informed me that Neil Bush will call back to re-schedule AWV and did not want to schedule anything on his behalf at this time.

## 2020-04-02 NOTE — Telephone Encounter (Signed)
Copied from [place] [id number]. Topic: Medicare AWV >> Apr 02, 2020  1:58 PM Cher Nakai R wrote: Reason for CRM:   Left message for patient to call back and schedule the Medicare Annual Wellness Visit (AWV) virtually or by telephone.  Last AWV 03/31/2019  Please schedule at anytime with CFP-Nurse Health Advisor.  45 minute appointment  Any questions, please call me at [phone]

## 2020-04-04 ENCOUNTER — Other Ambulatory Visit: Payer: Self-pay | Admitting: Family Medicine

## 2020-04-13 ENCOUNTER — Ambulatory Visit (INDEPENDENT_AMBULATORY_CARE_PROVIDER_SITE_OTHER): Payer: PPO | Admitting: Family Medicine

## 2020-04-13 ENCOUNTER — Other Ambulatory Visit: Payer: Self-pay

## 2020-04-13 ENCOUNTER — Encounter: Payer: Self-pay | Admitting: Family Medicine

## 2020-04-13 VITALS — BP 120/76 | HR 52 | Temp 97.7°F | Ht 69.0 in | Wt 179.4 lb

## 2020-04-13 DIAGNOSIS — N400 Enlarged prostate without lower urinary tract symptoms: Secondary | ICD-10-CM

## 2020-04-13 DIAGNOSIS — I10 Essential (primary) hypertension: Secondary | ICD-10-CM | POA: Diagnosis not present

## 2020-04-13 DIAGNOSIS — Z0000 Encounter for general adult medical examination without abnormal findings: Secondary | ICD-10-CM

## 2020-04-13 DIAGNOSIS — M79672 Pain in left foot: Secondary | ICD-10-CM | POA: Diagnosis not present

## 2020-04-13 DIAGNOSIS — E7800 Pure hypercholesterolemia, unspecified: Secondary | ICD-10-CM

## 2020-04-13 DIAGNOSIS — E782 Mixed hyperlipidemia: Secondary | ICD-10-CM

## 2020-04-13 DIAGNOSIS — I480 Paroxysmal atrial fibrillation: Secondary | ICD-10-CM | POA: Diagnosis not present

## 2020-04-13 LAB — URINALYSIS, ROUTINE W REFLEX MICROSCOPIC
Bilirubin, UA: NEGATIVE
Glucose, UA: NEGATIVE
Ketones, UA: NEGATIVE
Leukocytes,UA: NEGATIVE
Nitrite, UA: NEGATIVE
Protein,UA: NEGATIVE
RBC, UA: NEGATIVE
Specific Gravity, UA: 1.025 (ref 1.005–1.030)
Urobilinogen, Ur: 0.2 mg/dL (ref 0.2–1.0)
pH, UA: 5 (ref 5.0–7.5)

## 2020-04-13 LAB — MICROALBUMIN, URINE WAIVED
Creatinine, Urine Waived: 300 mg/dL (ref 10–300)
Microalb, Ur Waived: 30 mg/L — ABNORMAL HIGH (ref 0–19)
Microalb/Creat Ratio: <30 mg/g (ref <30)

## 2020-04-13 MED ORDER — LISINOPRIL 40 MG PO TABS: 20.0000 mg | ORAL_TABLET | Freq: Every day | ORAL | 1 refills | Status: DC

## 2020-04-13 MED ORDER — RIVAROXABAN 20 MG PO TABS: ORAL_TABLET | ORAL | 0 refills | Status: DC

## 2020-04-13 MED ORDER — ATORVASTATIN CALCIUM 40 MG PO TABS
20.0000 mg | ORAL_TABLET | Freq: Every day | ORAL | 1 refills | Status: DC
Start: 2020-04-13 — End: 2020-06-15

## 2020-04-13 MED ORDER — DICLOFENAC SODIUM 1 % EX GEL: 4.0000 g | Freq: Four times a day (QID) | CUTANEOUS | 3 refills | Status: DC

## 2020-04-13 NOTE — Assessment & Plan Note (Signed)
Under good control on current regimen. Continue current regimen. Continue to monitor. Call with any concerns. Refills given. Labs drawn today.   

## 2020-04-13 NOTE — Assessment & Plan Note (Signed)
Under good control on current regimen. Continue current regimen. Continue to monitor. Call with any concerns. Labs drawn today.  

## 2020-04-13 NOTE — Progress Notes (Signed)
BP 120/76   Pulse (!) 52   Temp 97.7 F (36.5 C) (Oral)   Ht 5\' 9"  (1.753 m)   Wt 179 lb 6.4 oz (81.4 kg)   SpO2 97%   BMI 26.49 kg/m    Subjective:    Patient ID: Neil Bush, male    DOB: 18-Nov-1950, 70 y.o.   MRN: [id number]  HPI: Neil Bush is a 70 y.o. male presenting on 04/13/2020 for comprehensive medical examination. Current medical complaints include:  Has been having shooting pains in his L foot in the top of his arch. Shooting pain about every 30 seconds are so. He has been using some CBD on it.   HYPERTENSION / HYPERLIPIDEMIA Satisfied with current treatment? yes Duration of hypertension: chronic BP monitoring frequency: not checking BP medication side effects: no Past BP meds: metoprolol, lisinopril Duration of hyperlipidemia: chronic Cholesterol medication side effects: no Cholesterol supplements: none Past cholesterol medications: atorvastatin Medication compliance: excellent compliance Aspirin: no Recent stressors: no Recurrent headaches: no Visual changes: no Palpitations: no Dyspnea: no Chest pain: no Lower extremity edema: no Dizzy/lightheaded: no  Interim Problems from his last visit: no  Depression Screen done today and results listed below:  Depression screen [facility] 2/9 04/13/2020 03/31/2019 04/09/2017 02/05/2017 09/01/2016  Decreased Interest 0 0 0 0 0  Down, Depressed, Hopeless 0 0 0 0 0  PHQ - 2 Score 0 0 0 0 0  Altered sleeping - 0 - - -  Tired, decreased energy - 0 - - -  Change in appetite - 0 - - -  Feeling bad or failure about yourself  - 0 - - -  Trouble concentrating - 0 - - -  Moving slowly or fidgety/restless - 0 - - -  Suicidal thoughts - 0 - - -  PHQ-9 Score - 0 - - -     Past Medical History:  Past Medical History:  Diagnosis Date  . Chest pain    a. 01/2016 in setting of Afib;  b. 02/2016 Ex MV: EF 55-65%, no ischemia.  Marland Kitchen Dysrhythmia   . Headache   . Hyperlipidemia   . Hypertension   . PAF (paroxysmal atrial  fibrillation) ([place])    a. 01/2016 - admission for rapid AF and c/p-->converted on IV dilt;  b. CHA2DS2VASc = 2-->Xarelto;  c. 01/2016 Echo: EF 55-60%, triv AI.  Marland Kitchen Palpitations    a. 08/2016 Zio Patch: symptomatic PAC's and brief (max 11 beats) of SVT/PAT (3 episodes).  . Skin cancer     Surgical History:  Past Surgical History:  Procedure Laterality Date  . CARDIAC CATHETERIZATION     [facility]   . CHOLECYSTECTOMY    . COLONOSCOPY WITH PROPOFOL N/A 04/17/2017   Procedure: COLONOSCOPY WITH PROPOFOL;  Surgeon: Lucilla Lame, MD;  Location: [place];  Service: Endoscopy;  Laterality: N/A;  . EYE SURGERY    . FOOT SURGERY    . HERNIA REPAIR     X 2 ingunial  . KNEE SURGERY Right   . POLYPECTOMY  04/17/2017   Procedure: POLYPECTOMY INTESTINAL;  Surgeon: Lucilla Lame, MD;  Location: [place];  Service: Endoscopy;;    Medications:  Current Outpatient Medications on File Prior to Visit  Medication Sig  . metoprolol succinate (TOPROL-XL) 50 MG 24 hr tablet Take 0.5 tablets (25 mg total) by mouth daily. Take with or immediately following a meal.   No current facility-administered medications on file prior to visit.    Allergies:  Allergies  Allergen Reactions  . Penicillins Rash    Has patient had a PCN reaction causing immediate rash, facial/tongue/throat swelling, SOB or lightheadedness with hypotension: no Has patient had a PCN reaction causing severe rash involving mucus membranes or skin necrosis: no Has patient had a PCN reaction that required hospitalization no Has patient had a PCN reaction occurring within the last 10 years: no If all of the above answers are "NO", then may proceed with Cephalosporin use.    Childhood allergy    Social History:  Social History   Socioeconomic History  . Marital status: Married    Spouse name: Not on file  . Number of children: Not on file  . Years of education: Not on file  . Highest education level: Not on file   Occupational History  . Occupation: partime truck Geophysicist/field seismologist  Tobacco Use  . Smoking status: Never Smoker  . Smokeless tobacco: Never Used  Vaping Use  . Vaping Use: Never used  Substance and Sexual Activity  . Alcohol use: No    Alcohol/week: 0.0 standard drinks  . Drug use: No  . Sexual activity: Yes  Other Topics Concern  . Not on file  Social History Narrative  . Not on file   Social Determinants of Health   Financial Resource Strain: Not on file  Food Insecurity: Not on file  Transportation Needs: Not on file  Physical Activity: Not on file  Stress: Not on file  Social Connections: Not on file  Intimate Partner Violence: Not on file   Social History   Tobacco Use  Smoking Status Never Smoker  Smokeless Tobacco Never Used   Social History   Substance and Sexual Activity  Alcohol Use No  . Alcohol/week: 0.0 standard drinks    Family History:  Family History  Problem Relation Age of Onset  . Cancer Mother   . Heart disease Father   . Diabetes Father   . Hypertension Father   . Heart disease Brother   . COPD Neg Hx   . Stroke Neg Hx     Past medical history, surgical history, medications, allergies, family history and social history reviewed with patient today and changes made to appropriate areas of the chart.   Review of Systems  Constitutional: Negative.   HENT: Negative.   Eyes: Negative.   Respiratory: Negative.   Cardiovascular: Negative.   Gastrointestinal: Negative.   Genitourinary: Negative.   Musculoskeletal: Negative.   Skin: Negative.    All other ROS negative except what is listed above and in the HPI.      Objective:    BP 120/76   Pulse (!) 52   Temp 97.7 F (36.5 C) (Oral)   Ht 5\' 9"  (1.753 m)   Wt 179 lb 6.4 oz (81.4 kg)   SpO2 97%   BMI 26.49 kg/m   Wt Readings from Last 3 Encounters:  04/13/20 179 lb 6.4 oz (81.4 kg)  12/08/19 179 lb (81.2 kg)  12/01/19 180 lb 12.8 oz (82 kg)    Physical Exam Vitals and nursing  note reviewed.  Constitutional:      General: He is not in acute distress.    Appearance: Normal appearance. He is not ill-appearing, toxic-appearing or diaphoretic.  HENT:     Head: Normocephalic and atraumatic.     Right Ear: Tympanic membrane, ear canal and external ear normal. There is no impacted cerumen.     Left Ear: Tympanic membrane, ear canal and external ear normal. There is no  impacted cerumen.     Nose: Nose normal. No congestion or rhinorrhea.     Mouth/Throat:     Mouth: Mucous membranes are moist.     Pharynx: Oropharynx is clear. No oropharyngeal exudate or posterior oropharyngeal erythema.  Eyes:     General: No scleral icterus.       Right eye: No discharge.        Left eye: No discharge.     Extraocular Movements: Extraocular movements intact.     Conjunctiva/sclera: Conjunctivae normal.     Pupils: Pupils are equal, round, and reactive to light.  Neck:     Vascular: No carotid bruit.  Cardiovascular:     Rate and Rhythm: Normal rate and regular rhythm.     Pulses: Normal pulses.     Heart sounds: No murmur heard. No friction rub. No gallop.   Pulmonary:     Effort: Pulmonary effort is normal. No respiratory distress.     Breath sounds: Normal breath sounds. No stridor. No wheezing, rhonchi or rales.  Chest:     Chest wall: No tenderness.  Abdominal:     General: Abdomen is flat. Bowel sounds are normal. There is no distension.     Palpations: Abdomen is soft. There is no mass.     Tenderness: There is no abdominal tenderness. There is no right CVA tenderness, left CVA tenderness, guarding or rebound.     Hernia: No hernia is present.  Genitourinary:    Comments: Genital exam deferred with shared decision making Musculoskeletal:        General: No swelling, tenderness, deformity or signs of injury.     Cervical back: Normal range of motion and neck supple. No rigidity. No muscular tenderness.     Right lower leg: No edema.     Left lower leg: No edema.   Lymphadenopathy:     Cervical: No cervical adenopathy.  Skin:    General: Skin is warm and dry.     Capillary Refill: Capillary refill takes less than 2 seconds.     Coloration: Skin is not jaundiced or pale.     Findings: No bruising, erythema, lesion or rash.  Neurological:     General: No focal deficit present.     Mental Status: He is alert and oriented to person, place, and time.     Cranial Nerves: No cranial nerve deficit.     Sensory: No sensory deficit.     Motor: No weakness.     Coordination: Coordination normal.     Gait: Gait normal.     Deep Tendon Reflexes: Reflexes normal.  Psychiatric:        Mood and Affect: Mood normal.        Behavior: Behavior normal.        Thought Content: Thought content normal.        Judgment: Judgment normal.     Results for orders placed or performed in visit on 09/30/19  Comprehensive metabolic panel  Result Value Ref Range   Glucose 126 (H) 65 - 99 mg/dL   BUN 22 8 - 27 mg/dL   Creatinine, Ser 1.14 0.76 - 1.27 mg/dL   GFR calc non Af Amer 64 >59 mL/min/1.73   GFR calc Af Amer 74 >59 mL/min/1.73   BUN/Creatinine Ratio 19 10 - 24   Sodium 144 134 - 144 mmol/L   Potassium 4.4 3.5 - 5.2 mmol/L   Chloride 101 96 - 106 mmol/L   CO2 25 20 - 29  mmol/L   Calcium 8.7 8.6 - 10.2 mg/dL   Total Protein 6.6 6.0 - 8.5 g/dL   Albumin 4.2 3.7 - 4.7 g/dL   Globulin, Total 2.4 1.5 - 4.5 g/dL   Albumin/Globulin Ratio 1.8 1.2 - 2.2   Bilirubin Total 0.6 0.0 - 1.2 mg/dL   Alkaline Phosphatase 88 48 - 121 IU/L   AST 15 0 - 40 IU/L   ALT 14 0 - 44 IU/L  CBC with Differential/Platelet  Result Value Ref Range   WBC 6.4 3.4 - 10.8 x10E3/uL   RBC 4.97 4.14 - 5.80 x10E6/uL   Hemoglobin 16.3 13.0 - 17.7 g/dL   Hematocrit 46.6 37.5 - 51.0 %   MCV 94 79 - 97 fL   MCH 32.8 26.6 - 33.0 pg   MCHC 35.0 31.5 - 35.7 g/dL   RDW 13.0 11.6 - 15.4 %   Platelets 210 150 - 450 x10E3/uL   Neutrophils 64 Not Estab. %   Lymphs 26 Not Estab. %   Monocytes 6  Not Estab. %   Eos 2 Not Estab. %   Basos 1 Not Estab. %   Neutrophils Absolute 4.1 1.4 - 7.0 x10E3/uL   Lymphocytes Absolute 1.7 0.7 - 3.1 x10E3/uL   Monocytes Absolute 0.4 0.1 - 0.9 x10E3/uL   EOS (ABSOLUTE) 0.1 0.0 - 0.4 x10E3/uL   Basophils Absolute 0.0 0.0 - 0.2 x10E3/uL   Immature Granulocytes 1 Not Estab. %   Immature Grans (Abs) 0.0 0.0 - 0.1 x10E3/uL  Lipid Panel w/o Chol/HDL Ratio  Result Value Ref Range   Cholesterol, Total 143 100 - 199 mg/dL   Triglycerides 167 (H) 0 - 149 mg/dL   HDL 38 (L) >39 mg/dL   VLDL Cholesterol Cal 29 5 - 40 mg/dL   LDL Chol Calc (NIH) 76 0 - 99 mg/dL      Assessment & Plan:   Problem List Items Addressed This Visit      Cardiovascular and Mediastinum   Essential hypertension    Under good control on current regimen. Continue current regimen. Continue to monitor. Call with any concerns. Refills given. Labs drawn today.       Relevant Medications   rivaroxaban (XARELTO) 20 MG TABS tablet   lisinopril (ZESTRIL) 40 MG tablet   atorvastatin (LIPITOR) 40 MG tablet   Other Relevant Orders   Comprehensive metabolic panel   TSH   Microalbumin, Urine Waived   Paroxysmal atrial fibrillation ([facility])    Under good control on current regimen. Continue current regimen. Continue to monitor. Call with any concerns. Refills given. Labs drawn today.       Relevant Medications   rivaroxaban (XARELTO) 20 MG TABS tablet   lisinopril (ZESTRIL) 40 MG tablet   atorvastatin (LIPITOR) 40 MG tablet   Other Relevant Orders   Comprehensive metabolic panel   CBC with Differential/Platelet     Genitourinary   BPH (benign prostatic hyperplasia)    Under good control on current regimen. Continue current regimen. Continue to monitor. Call with any concerns. Labs drawn today.       Relevant Orders   Comprehensive metabolic panel   PSA   Urinalysis, Routine w reflex microscopic     Other   Hyperlipidemia    Under good control on current regimen.  Continue current regimen. Continue to monitor. Call with any concerns. Refills given. Labs drawn today.       Relevant Medications   rivaroxaban (XARELTO) 20 MG TABS tablet   lisinopril (ZESTRIL) 40 MG  tablet   atorvastatin (LIPITOR) 40 MG tablet   Other Relevant Orders   Comprehensive metabolic panel   Lipid Panel w/o Chol/HDL Ratio    Other Visit Diagnoses    Routine general medical examination at a health care facility    -  Primary   Vaccines up to date. Screening labs checked today. Colonoscopy up to date. Continue diet and exercise. Call with any concerns.    Foot pain, left       Will follow up with his podiatrist and start voltaren. Call with any concerns.        Discussed aspirin prophylaxis for myocardial infarction prevention and decision was it was not indicated  LABORATORY TESTING:  Health maintenance labs ordered today as discussed above.   The natural history of prostate cancer and ongoing controversy regarding screening and potential treatment outcomes of prostate cancer has been discussed with the patient. The meaning of a false positive PSA and a false negative PSA has been discussed. He indicates understanding of the limitations of this screening test and wishes to proceed with screening PSA testing.   IMMUNIZATIONS:   - Tdap: Tetanus vaccination status reviewed: last tetanus booster within 10 years. - Influenza: Refused - Pneumovax: Up to date - Prevnar: Up to date - COVID: Up to date  SCREENING: - Colonoscopy: Up to date  Discussed with patient purpose of the colonoscopy is to detect colon cancer at curable precancerous or early stages   PATIENT COUNSELING:    Sexuality: Discussed sexually transmitted diseases, partner selection, use of condoms, avoidance of unintended pregnancy  and contraceptive alternatives.   Advised to avoid cigarette smoking.  I discussed with the patient that most people either abstain from alcohol or drink within safe limits  (<=14/week and <=4 drinks/occasion for males, <=7/weeks and <= 3 drinks/occasion for females) and that the risk for alcohol disorders and other health effects rises proportionally with the number of drinks per week and how often a drinker exceeds daily limits.  Discussed cessation/primary prevention of drug use and availability of treatment for abuse.   Diet: Encouraged to adjust caloric intake to maintain  or achieve ideal body weight, to reduce intake of dietary saturated fat and total fat, to limit sodium intake by avoiding high sodium foods and not adding table salt, and to maintain adequate dietary potassium and calcium preferably from fresh fruits, vegetables, and low-fat dairy products.    stressed the importance of regular exercise  Injury prevention: Discussed safety belts, safety helmets, smoke detector, smoking near bedding or upholstery.   Dental health: Discussed importance of regular tooth brushing, flossing, and dental visits.   Follow up plan: NEXT PREVENTATIVE PHYSICAL DUE IN 1 YEAR. Return in about 6 months (around 10/11/2020).

## 2020-04-13 NOTE — Patient Instructions (Signed)
Health Maintenance After Age 72 After age 72, you are at a higher risk for certain long-term diseases and infections as well as injuries from falls. Falls are a major cause of broken bones and head injuries in people who are older than age 72. Getting regular preventive care can help to keep you healthy and well. Preventive care includes getting regular testing and making lifestyle changes as recommended by your health care provider. Talk with your health care provider about:  Which screenings and tests you should have. A screening is a test that checks for a disease when you have no symptoms.  A diet and exercise plan that is right for you. What should I know about screenings and tests to prevent falls? Screening and testing are the best ways to find a health problem early. Early diagnosis and treatment give you the best chance of managing medical conditions that are common after age 72. Certain conditions and lifestyle choices may make you more likely to have a fall. Your health care provider may recommend:  Regular vision checks. Poor vision and conditions such as cataracts can make you more likely to have a fall. If you wear glasses, make sure to get your prescription updated if your vision changes.  Medicine review. Work with your health care provider to regularly review all of the medicines you are taking, including over-the-counter medicines. Ask your health care provider about any side effects that may make you more likely to have a fall. Tell your health care provider if any medicines that you take make you feel dizzy or sleepy.  Osteoporosis screening. Osteoporosis is a condition that causes the bones to get weaker. This can make the bones weak and cause them to break more easily.  Blood pressure screening. Blood pressure changes and medicines to control blood pressure can make you feel dizzy.  Strength and balance checks. Your health care provider may recommend certain tests to check your  strength and balance while standing, walking, or changing positions.  Foot health exam. Foot pain and numbness, as well as not wearing proper footwear, can make you more likely to have a fall.  Depression screening. You may be more likely to have a fall if you have a fear of falling, feel emotionally low, or feel unable to do activities that you used to do.  Alcohol use screening. Using too much alcohol can affect your balance and may make you more likely to have a fall. What actions can I take to lower my risk of falls? General instructions  Talk with your health care provider about your risks for falling. Tell your health care provider if: ? You fall. Be sure to tell your health care provider about all falls, even ones that seem minor. ? You feel dizzy, sleepy, or off-balance.  Take over-the-counter and prescription medicines only as told by your health care provider. These include any supplements.  Eat a healthy diet and maintain a healthy weight. A healthy diet includes low-fat dairy products, low-fat (lean) meats, and fiber from whole grains, beans, and lots of fruits and vegetables. Home safety  Remove any tripping hazards, such as rugs, cords, and clutter.  Install safety equipment such as grab bars in bathrooms and safety rails on stairs.  Keep rooms and walkways well-lit. Activity  Follow a regular exercise program to stay fit. This will help you maintain your balance. Ask your health care provider what types of exercise are appropriate for you.  If you need a cane or walker,   use it as recommended by your health care provider.  Wear supportive shoes that have nonskid soles.   Lifestyle  Do not drink alcohol if your health care provider tells you not to drink.  If you drink alcohol, limit how much you have: ? 0-1 drink a day for women. ? 0-2 drinks a day for men.  Be aware of how much alcohol is in your drink. In the [place] one drink equals one typical bottle of beer (12  oz), one-half glass of wine (5 oz), or one shot of hard liquor (1 oz).  Do not use any products that contain nicotine or tobacco, such as cigarettes and e-cigarettes. If you need help quitting, ask your health care provider. Summary  Having a healthy lifestyle and getting preventive care can help to protect your health and wellness after age 72.  Screening and testing are the best way to find a health problem early and help you avoid having a fall. Early diagnosis and treatment give you the best chance for managing medical conditions that are more common for people who are older than age 72.  Falls are a major cause of broken bones and head injuries in people who are older than age 72. Take precautions to prevent a fall at home.  Work with your health care provider to learn what changes you can make to improve your health and wellness and to prevent falls. This information is not intended to replace advice given to you by your health care provider. Make sure you discuss any questions you have with your health care provider. Document Revised: 06/10/2018 Document Reviewed: 12/31/2016 Elsevier Patient Education  2021 [employer]  

## 2020-04-14 ENCOUNTER — Encounter: Payer: Self-pay | Admitting: Family Medicine

## 2020-04-14 LAB — COMPREHENSIVE METABOLIC PANEL
ALT: 20 IU/L (ref 0–44)
AST: 19 IU/L (ref 0–40)
Albumin/Globulin Ratio: 2.3 — ABNORMAL HIGH (ref 1.2–2.2)
Albumin: 4.5 g/dL (ref 3.7–4.7)
Alkaline Phosphatase: 88 IU/L (ref 44–121)
BUN/Creatinine Ratio: 19 (ref 10–24)
BUN: 23 mg/dL (ref 8–27)
Bilirubin Total: 0.6 mg/dL (ref 0.0–1.2)
CO2: 23 mmol/L (ref 20–29)
Calcium: 9.2 mg/dL (ref 8.6–10.2)
Chloride: 103 mmol/L (ref 96–106)
Creatinine, Ser: 1.21 mg/dL (ref 0.76–1.27)
GFR calc Af Amer: 69 mL/min/{1.73_m2} (ref >59)
GFR calc non Af Amer: 60 mL/min/{1.73_m2} (ref >59)
Globulin, Total: 2 g/dL (ref 1.5–4.5)
Glucose: 100 mg/dL — ABNORMAL HIGH (ref 65–99)
Potassium: 5 mmol/L (ref 3.5–5.2)
Sodium: 141 mmol/L (ref 134–144)
Total Protein: 6.5 g/dL (ref 6.0–8.5)

## 2020-04-14 LAB — CBC WITH DIFFERENTIAL/PLATELET
Basophils Absolute: 0.1 10*3/uL (ref 0.0–0.2)
Basos: 1 %
EOS (ABSOLUTE): 0.1 10*3/uL (ref 0.0–0.4)
Eos: 2 %
Hematocrit: 50.2 % (ref 37.5–51.0)
Hemoglobin: 16.9 g/dL (ref 13.0–17.7)
Immature Grans (Abs): 0 10*3/uL (ref 0.0–0.1)
Immature Granulocytes: 0 %
Lymphocytes Absolute: 1.8 10*3/uL (ref 0.7–3.1)
Lymphs: 28 %
MCH: 31.8 pg (ref 26.6–33.0)
MCHC: 33.7 g/dL (ref 31.5–35.7)
MCV: 94 fL (ref 79–97)
Monocytes Absolute: 0.5 10*3/uL (ref 0.1–0.9)
Monocytes: 8 %
Neutrophils Absolute: 3.9 10*3/uL (ref 1.4–7.0)
Neutrophils: 61 %
Platelets: 228 10*3/uL (ref 150–450)
RBC: 5.32 x10E6/uL (ref 4.14–5.80)
RDW: 13 % (ref 11.6–15.4)
WBC: 6.4 10*3/uL (ref 3.4–10.8)

## 2020-04-14 LAB — LIPID PANEL W/O CHOL/HDL RATIO
Cholesterol, Total: 147 mg/dL (ref 100–199)
HDL: 44 mg/dL (ref >39)
LDL Chol Calc (NIH): 80 mg/dL (ref 0–99)
Triglycerides: 128 mg/dL (ref 0–149)
VLDL Cholesterol Cal: 23 mg/dL (ref 5–40)

## 2020-04-14 LAB — PSA: Prostate Specific Ag, Serum: 1 ng/mL (ref 0.0–4.0)

## 2020-04-14 LAB — TSH: TSH: 1.95 u[IU]/mL (ref 0.450–4.500)

## 2020-04-18 ENCOUNTER — Ambulatory Visit (INDEPENDENT_AMBULATORY_CARE_PROVIDER_SITE_OTHER): Payer: PPO | Admitting: Podiatry

## 2020-04-18 ENCOUNTER — Other Ambulatory Visit: Payer: Self-pay

## 2020-04-18 ENCOUNTER — Encounter: Payer: Self-pay | Admitting: Podiatry

## 2020-04-18 ENCOUNTER — Telehealth: Payer: Self-pay

## 2020-04-18 DIAGNOSIS — L603 Nail dystrophy: Secondary | ICD-10-CM | POA: Diagnosis not present

## 2020-04-18 DIAGNOSIS — L03032 Cellulitis of left toe: Secondary | ICD-10-CM | POA: Diagnosis not present

## 2020-04-18 MED ORDER — TERBINAFINE HCL 250 MG PO TABS
250.0000 mg | ORAL_TABLET | Freq: Every day | ORAL | 0 refills | Status: DC
Start: 2020-04-18 — End: 2020-05-16

## 2020-04-18 MED ORDER — CIPROFLOXACIN-DEXAMETHASONE 0.3-0.1 % OT SUSP: OTIC | 3 refills | Status: DC

## 2020-04-18 NOTE — Telephone Encounter (Signed)
Patient called and stated that he went to pick up the antibiotic drops and it was way too expensive ($100) for a small bottle.  He wanted to know if you could prescribe something else   Please advise

## 2020-04-18 NOTE — Patient Instructions (Signed)
Terbinafine tablets What is this medicine? TERBINAFINE (TER bin a feen) is an antifungal medicine. It is used to treat certain kinds of fungal or yeast infections. This medicine may be used for other purposes; ask your health care provider or pharmacist if you have questions. COMMON BRAND NAME(S): Lamisil, Terbinex What should I tell my health care provider before I take this medicine? They need to know if you have any of these conditions:  drink alcoholic beverages  kidney disease  liver disease  an unusual or allergic reaction to terbinafine, other medicines, foods, dyes, or preservatives  pregnant or trying to get pregnant  breast-feeding How should I use this medicine? Take this medicine by mouth with a full glass of water. Follow the directions on the prescription label. You can take this medicine with food or on an empty stomach. Take your medicine at regular intervals. Do not take your medicine more often than directed. Do not skip doses or stop your medicine early even if you feel better. Do not stop taking except on your doctor's advice. A special MedGuide will be given to you by the pharmacist with each prescription and refill. Be sure to read this information carefully each time. Talk to your pediatrician regarding the use of this medicine in children. Special care may be needed. Overdosage: If you think you have taken too much of this medicine contact a poison control center or emergency room at once. NOTE: This medicine is only for you. Do not share this medicine with others. What if I miss a dose? If you miss a dose, take it as soon as you can. If it is almost time for your next dose, take only that dose. Do not take double or extra doses. What may interact with this medicine? Do not take this medicine with any of the following medications:  thioridazine This medicine may also interact with the following  medications:  beta-blockers  caffeine  cimetidine  cyclosporine  medicines for depression, anxiety, or psychotic disturbances  medicines for fungal infections like fluconazole and ketoconazole  medicines for irregular heartbeat like amiodarone, flecainide and propafenone  rifampin  warfarin This list may not describe all possible interactions. Give your health care provider a list of all the medicines, herbs, non-prescription drugs, or dietary supplements you use. Also tell them if you smoke, drink alcohol, or use illegal drugs. Some items may interact with your medicine. What should I watch for while using this medicine? Visit your doctor or health care provider regularly. Tell your doctor right away if you have nausea or vomiting, loss of appetite, stomach pain on your right upper side, yellow skin, dark urine, light stools, or are over tired. Some fungal infections need many weeks or months of treatment to cure. If you are taking this medicine for a long time, you will need to have important blood work done. This medicine may cause serious skin reactions. They can happen weeks to months after starting the medicine. Contact your health care provider right away if you notice fevers or flu-like symptoms with a rash. The rash may be red or purple and then turn into blisters or peeling of the skin. Or, you might notice a red rash with swelling of the face, lips or lymph nodes in your neck or under your arms. What side effects may I notice from receiving this medicine? Side effects that you should report to your doctor or health care professional as soon as possible:  allergic reactions like skin rash or hives,   swelling of the face, lips, or tongue  changes in vision  dark urine  fever or infection  general ill feeling or flu-like symptoms  light-colored stools  loss of appetite, nausea  rash, fever, and swollen lymph nodes  redness, blistering, peeling or loosening of the  skin, including inside the mouth  right upper belly pain  unusually weak or tired  yellowing of the eyes or skin Side effects that usually do not require medical attention (report to your doctor or health care professional if they continue or are bothersome):  changes in taste  diarrhea  hair loss  muscle or joint pain  stomach gas  stomach upset This list may not describe all possible side effects. Call your doctor for medical advice about side effects. You may report side effects to FDA at 1-800-FDA-1088. Where should I keep my medicine? Keep out of the reach of children. Store at room temperature below 25 degrees C (77 degrees F). Protect from light. Throw away any unused medicine after the expiration date. NOTE: This sheet is a summary. It may not cover all possible information. If you have questions about this medicine, talk to your doctor, pharmacist, or health care provider.  2021 Elsevier/Gold Standard (2018-05-28 15:37:07)  

## 2020-04-18 NOTE — Progress Notes (Signed)
He presents today with a red sore toe left hallux  Objective: Vital signs are stable he is alert oriented x3 pathology report does demonstrate onychomycosis. It also demonstrates Pseudomonas.  Assessment onychomycosis dermatophytosis with Pseudomonas aeruginosa.  Plan: Reviewed a recent complete metabolic panel with normal liver and BUN with creatinine. Start him on Lamisil 1 tablet every day we discussed pros and cons of use of these medications the risks of side effects and possible allergies. He understands this is amenable to it is also started him on Cipro drops to drop to the end of the toe and the proximal nail fold.

## 2020-04-20 ENCOUNTER — Ambulatory Visit: Payer: PPO

## 2020-05-04 ENCOUNTER — Ambulatory Visit (INDEPENDENT_AMBULATORY_CARE_PROVIDER_SITE_OTHER): Payer: PPO

## 2020-05-04 VITALS — Ht 70.0 in | Wt 180.0 lb

## 2020-05-04 DIAGNOSIS — Z0000 Encounter for general adult medical examination without abnormal findings: Secondary | ICD-10-CM | POA: Diagnosis not present

## 2020-05-04 NOTE — Progress Notes (Signed)
I connected with Donovan Kail today by telephone and verified that I am speaking with the correct person using two identifiers. Location patient: home Location provider: work Persons participating in the virtual visit: Tiny Rietz, Glenna Durand LPN.   I discussed the limitations, risks, security and privacy concerns of performing an evaluation and management service by telephone and the availability of in person appointments. I also discussed with the patient that there may be a patient responsible charge related to this service. The patient expressed understanding and verbally consented to this telephonic visit.    Interactive audio and video telecommunications were attempted between this provider and patient, however failed, due to patient having technical difficulties OR patient did not have access to video capability.  We continued and completed visit with audio only.     Vital signs may be patient reported or missing.  Subjective:   Neil Bush is a 70 y.o. male who presents for Medicare Annual/Subsequent preventive examination.  Review of Systems     Cardiac Risk Factors include: advanced age (>65men, >100 women);dyslipidemia;hypertension;male gender;sedentary lifestyle     Objective:    Today's Vitals   05/04/20 0856 05/04/20 0857  Weight: 180 lb (81.6 kg)   Height: 5\' 10"  (1.778 m)   PainSc:  3    Body mass index is 25.83 kg/m.  Advanced Directives 05/04/2020 12/31/2018 09/29/2018 04/17/2017 01/26/2016 01/26/2016  Does Patient Have a Medical Advance Directive? Yes Yes Yes No No No  Type of Paramedic of [place];Living will [place];Living will [place];Living will - - -  Does patient want to make changes to medical advance directive? - No - Patient declined No - Patient declined - - -  Copy of [place] in Chart? No - copy requested No - copy requested No - copy requested - - -  Would  patient like information on creating a medical advance directive? - No - Patient declined No - Patient declined No - Patient declined No - Patient declined -    Current Medications (verified) Outpatient Encounter Medications as of 05/04/2020  Medication Sig  . atorvastatin (LIPITOR) 40 MG tablet Take 0.5 tablets (20 mg total) by mouth daily at 6 PM.  . diclofenac Sodium (VOLTAREN) 1 % GEL Apply 4 g topically 4 (four) times daily.  Marland Kitchen lisinopril (ZESTRIL) 40 MG tablet Take 0.5 tablets (20 mg total) by mouth daily.  . rivaroxaban (XARELTO) 20 MG TABS tablet Take 1 tablet (20 mg total) by mouth daily.  Marland Kitchen terbinafine (LAMISIL) 250 MG tablet Take 1 tablet (250 mg total) by mouth daily.  . ciprofloxacin-dexamethasone (CIPRODEX) OTIC suspension Apply 1-2 drops to toe BID (Patient not taking: Reported on 05/04/2020)  . metoprolol succinate (TOPROL-XL) 50 MG 24 hr tablet Take 0.5 tablets (25 mg total) by mouth daily. Take with or immediately following a meal.   No facility-administered encounter medications on file as of 05/04/2020.    Allergies (verified) Penicillins   History: Past Medical History:  Diagnosis Date  . Chest pain    a. 01/2016 in setting of Afib;  b. 02/2016 Ex MV: EF 55-65%, no ischemia.  Marland Kitchen Dysrhythmia   . Headache   . Hyperlipidemia   . Hypertension   . PAF (paroxysmal atrial fibrillation) ([facility])    a. 01/2016 - admission for rapid AF and c/p-->converted on IV dilt;  b. CHA2DS2VASc = 2-->Xarelto;  c. 01/2016 Echo: EF 55-60%, triv AI.  Marland Kitchen Palpitations    a.  08/2016 Zio Patch: symptomatic PAC's and brief (max 11 beats) of SVT/PAT (3 episodes).  . Skin cancer    Past Surgical History:  Procedure Laterality Date  . CARDIAC CATHETERIZATION     [facility]   . CHOLECYSTECTOMY    . COLONOSCOPY WITH PROPOFOL N/A 04/17/2017   Procedure: COLONOSCOPY WITH PROPOFOL;  Surgeon: Lucilla Lame, MD;  Location: [place];  Service: Endoscopy;  Laterality: N/A;  . EYE SURGERY    . FOOT  SURGERY    . HERNIA REPAIR     X 2 ingunial  . KNEE SURGERY Right   . POLYPECTOMY  04/17/2017   Procedure: POLYPECTOMY INTESTINAL;  Surgeon: Lucilla Lame, MD;  Location: [place];  Service: Endoscopy;;   Family History  Problem Relation Age of Onset  . Cancer Mother   . Heart disease Father   . Diabetes Father   . Hypertension Father   . Heart disease Brother   . COPD Neg Hx   . Stroke Neg Hx    Social History   Socioeconomic History  . Marital status: Married    Spouse name: Not on file  . Number of children: Not on file  . Years of education: Not on file  . Highest education level: Not on file  Occupational History  . Occupation: partime truck Geophysicist/field seismologist  Tobacco Use  . Smoking status: Never Smoker  . Smokeless tobacco: Never Used  Vaping Use  . Vaping Use: Never used  Substance and Sexual Activity  . Alcohol use: No    Alcohol/week: 0.0 standard drinks  . Drug use: No  . Sexual activity: Yes  Other Topics Concern  . Not on file  Social History Narrative  . Not on file   Social Determinants of Health   Financial Resource Strain: Low Risk   . Difficulty of Paying Living Expenses: Not hard at all  Food Insecurity: No Food Insecurity  . Worried About Charity fundraiser in the Last Year: Never true  . Ran Out of Food in the Last Year: Never true  Transportation Needs: No Transportation Needs  . Lack of Transportation (Medical): No  . Lack of Transportation (Non-Medical): No  Physical Activity: Inactive  . Days of Exercise per Week: 0 days  . Minutes of Exercise per Session: 0 min  Stress: No Stress Concern Present  . Feeling of Stress : Not at all  Social Connections: Not on file    Tobacco Counseling Counseling given: Not Answered   Clinical Intake:  Pre-visit preparation completed: Yes  Pain : 0-10 Pain Score: 3  Pain Type: Acute pain Pain Location: Foot Pain Orientation: Left Pain Descriptors / Indicators: Sharp Pain Onset: More than a  month ago Pain Frequency: Intermittent     Nutritional Status: BMI 25 -29 Overweight Nutritional Risks: None Diabetes: No  How often do you need to have someone help you when you read instructions, pamphlets, or other written materials from your doctor or pharmacy?: 1 - Never What is the last grade level you completed in school?: some college  Diabetic? no  Interpreter Needed?: No  Information entered by :: NAllen LPN   Activities of Daily Living In your present state of health, do you have any difficulty performing the following activities: 05/04/2020  Hearing? N  Vision? N  Difficulty concentrating or making decisions? N  Walking or climbing stairs? N  Dressing or bathing? N  Doing errands, shopping? N  Preparing Food and eating ? N  Using the Toilet?  N  In the past six months, have you accidently leaked urine? N  Do you have problems with loss of bowel control? N  Managing your Medications? N  Managing your Finances? N  Housekeeping or managing your Housekeeping? N  Some recent data might be hidden    Patient Care Team: [name], DO as PCP - General (Family Medicine) Rockey Situ Kathlene November, MD as PCP - Cardiology (Cardiology) Carloyn Manner, MD as Referring Physician (Otolaryngology) Garrel Ridgel, [place] as Consulting Physician (Podiatry) Birder Robson, MD as Referring Physician (Ophthalmology) Bary Castilla Forest Gleason, MD (General Surgery) Lollie Sails, MD (Inactive) as Consulting Physician (Gastroenterology)  Indicate any recent Medical Services you may have received from other than [facility] providers in the past year (date may be approximate).     Assessment:   This is a routine wellness examination for [place].  Hearing/Vision screen No exam data present  Dietary issues and exercise activities discussed: Current Exercise Habits: The patient does not participate in regular exercise at present  Goals    . Patient Stated     05/04/2020, no goals       Depression Screen PHQ 2/9 Scores 05/04/2020 04/13/2020 03/31/2019 04/09/2017 02/05/2017 09/01/2016 01/21/2016  PHQ - 2 Score 0 0 0 0 0 0 0  PHQ- 9 Score - - 0 - - - -    Fall Risk Fall Risk  05/04/2020 04/13/2020 03/31/2019 03/29/2018 04/09/2017  Falls in the past year? 0 0 0 0 No  Number falls in past yr: - 0 0 0 -  Injury with Fall? - 0 0 0 -  Risk for fall due to : Medication side effect - - - -  Follow up Falls evaluation completed;Education provided;Falls prevention discussed Falls evaluation completed - Falls evaluation completed -    FALL RISK PREVENTION PERTAINING TO THE HOME:  Any stairs in or around the home? Yes  If so, are there any without handrails? No  Home free of loose throw rugs in walkways, pet beds, electrical cords, etc? Yes  Adequate lighting in your home to reduce risk of falls? Yes   ASSISTIVE DEVICES UTILIZED TO PREVENT FALLS:  Life alert? No  Use of a cane, walker or w/c? No  Grab bars in the bathroom? No  Shower chair or bench in shower? No  Elevated toilet seat or a handicapped toilet? Yes   TIMED UP AND GO:  Was the test performed? No   Cognitive Function:     6CIT Screen 05/04/2020 03/31/2019  What Year? 0 points 0 points  What month? 0 points 0 points  What time? 0 points 0 points  Count back from 20 0 points 0 points  Months in reverse 0 points 0 points  Repeat phrase 0 points 2 points  Total Score 0 2    Immunizations Immunization History  Administered Date(s) Administered  . Influenza, High Dose Seasonal PF 01/21/2016  . Influenza,inj,Quad PF,6+ Mos 12/26/2014, 01/01/2017  . Influenza,inj,quad, With Preservative 01/20/2018, 12/14/2018  . Influenza-Unspecified 01/20/2018  . Moderna Sars-Covid-2 Vaccination 05/02/2019, 05/23/2019, 01/05/2020  . Pneumococcal Conjugate-13 12/26/2014  . Pneumococcal Polysaccharide-23 01/21/2016  . Td 03/31/2019  . Tdap 11/02/2008  . Zoster 11/12/2010    TDAP status: Up to date  Flu Vaccine status: Due,  Education has been provided regarding the importance of this vaccine. Advised may receive this vaccine at local pharmacy or Health Dept. Aware to provide a copy of the vaccination record if obtained from local pharmacy or Health Dept. Verbalized acceptance  and understanding.  Pneumococcal vaccine status: Up to date  Covid-19 vaccine status: Completed vaccines  Qualifies for Shingles Vaccine? Yes   Zostavax completed Yes   Shingrix Completed?: No.    Education has been provided regarding the importance of this vaccine. Patient has been advised to call insurance company to determine out of pocket expense if they have not yet received this vaccine. Advised may also receive vaccine at local pharmacy or Health Dept. Verbalized acceptance and understanding.  Screening Tests Health Maintenance  Topic Date Due  . INFLUENZA VACCINE  05/31/2020 (Originally 10/02/2019)  . COVID-19 Vaccine (4 - Booster) 07/04/2020  . COLONOSCOPY (Pts 45-68yrs Insurance coverage will need to be confirmed)  04/17/2022  . TETANUS/TDAP  03/30/2029  . Hepatitis C Screening  Completed  . PNA vac Low Risk Adult  Completed  . HPV VACCINES  Aged Out    Health Maintenance  There are no preventive care reminders to display for this patient.  Colorectal cancer screening: Type of screening: Colonoscopy. Completed 04/17/2017. Repeat every 5 years  Lung Cancer Screening: (Low Dose CT Chest recommended if Age 38-80 years, 30 pack-year currently smoking OR have quit w/in 15years.) does not qualify.   Lung Cancer Screening Referral: no  Additional Screening:  Hepatitis C Screening: does qualify; Completed 04/09/2017  Vision Screening: Recommended annual ophthalmology exams for early detection of glaucoma and other disorders of the eye. Is the patient up to date with their annual eye exam?  Yes  Who is the provider or what is the name of the office in which the patient attends annual eye exams? [facility] If pt is not  established with a provider, would they like to be referred to a provider to establish care? No .   Dental Screening: Recommended annual dental exams for proper oral hygiene  Community Resource Referral / Chronic Care Management: CRR required this visit?  No   CCM required this visit?  No      Plan:     I have personally reviewed and noted the following in the patient's chart:   . Medical and social history . Use of alcohol, tobacco or illicit drugs  . Current medications and supplements . Functional ability and status . Nutritional status . Physical activity . Advanced directives . List of other physicians . Hospitalizations, surgeries, and ER visits in previous 12 months . Vitals . Screenings to include cognitive, depression, and falls . Referrals and appointments  In addition, I have reviewed and discussed with patient certain preventive protocols, quality metrics, and best practice recommendations. A written personalized care plan for preventive services as well as general preventive health recommendations were provided to patient.     Kellie Simmering, LPN   05/05/5972   Nurse Notes:

## 2020-05-04 NOTE — Patient Instructions (Signed)
Neil Bush , Thank you for taking time to come for your Medicare Wellness Visit. I appreciate your ongoing commitment to your health goals. Please review the following plan we discussed and let me know if I can assist you in the future.   Screening recommendations/referrals: Colonoscopy: completed 04/17/2017, due 04/17/2022 Recommended yearly ophthalmology/optometry visit for glaucoma screening and checkup Recommended yearly dental visit for hygiene and checkup  Vaccinations: Influenza vaccine: due Pneumococcal vaccine: completed 01/21/2016 Tdap vaccine: completed 03/31/2019, due 03/30/2029 Shingles vaccine: discussed   Covid-19:  01/05/2020, 05/23/2019, 05/02/2019  Advanced directives: Please bring a copy of your POA (Power of Attorney) and/or Living Will to your next appointment.   Conditions/risks identified: none  Next appointment: Follow up in one year for your annual wellness visit.   Preventive Care 72 Years and Older, Male Preventive care refers to lifestyle choices and visits with your health care provider that can promote health and wellness. What does preventive care include?  A yearly physical exam. This is also called an annual well check.  Dental exams once or twice a year.  Routine eye exams. Ask your health care provider how often you should have your eyes checked.  Personal lifestyle choices, including:  Daily care of your teeth and gums.  Regular physical activity.  Eating a healthy diet.  Avoiding tobacco and drug use.  Limiting alcohol use.  Practicing safe sex.  Taking low doses of aspirin every day.  Taking vitamin and mineral supplements as recommended by your health care provider. What happens during an annual well check? The services and screenings done by your health care provider during your annual well check will depend on your age, overall health, lifestyle risk factors, and family history of disease. Counseling  Your health care provider may  ask you questions about your:  Alcohol use.  Tobacco use.  Drug use.  Emotional well-being.  Home and relationship well-being.  Sexual activity.  Eating habits.  History of falls.  Memory and ability to understand (cognition).  Work and work Statistician. Screening  You may have the following tests or measurements:  Height, weight, and BMI.  Blood pressure.  Lipid and cholesterol levels. These may be checked every 5 years, or more frequently if you are over 47 years old.  Skin check.  Lung cancer screening. You may have this screening every year starting at age 67 if you have a 30-pack-year history of smoking and currently smoke or have quit within the past 15 years.  Fecal occult blood test (FOBT) of the stool. You may have this test every year starting at age 86.  Flexible sigmoidoscopy or colonoscopy. You may have a sigmoidoscopy every 5 years or a colonoscopy every 10 years starting at age 55.  Prostate cancer screening. Recommendations will vary depending on your family history and other risks.  Hepatitis C blood test.  Hepatitis B blood test.  Sexually transmitted disease (STD) testing.  Diabetes screening. This is done by checking your blood sugar (glucose) after you have not eaten for a while (fasting). You may have this done every 1-3 years.  Abdominal aortic aneurysm (AAA) screening. You may need this if you are a current or former smoker.  Osteoporosis. You may be screened starting at age 65 if you are at high risk. Talk with your health care provider about your test results, treatment options, and if necessary, the need for more tests. Vaccines  Your health care provider may recommend certain vaccines, such as:  Influenza vaccine. This is  recommended every year.  Tetanus, diphtheria, and acellular pertussis (Tdap, Td) vaccine. You may need a Td booster every 10 years.  Zoster vaccine. You may need this after age 55.  Pneumococcal 13-valent  conjugate (PCV13) vaccine. One dose is recommended after age 58.  Pneumococcal polysaccharide (PPSV23) vaccine. One dose is recommended after age 59. Talk to your health care provider about which screenings and vaccines you need and how often you need them. This information is not intended to replace advice given to you by your health care provider. Make sure you discuss any questions you have with your health care provider. Document Released: 03/16/2015 Document Revised: 11/07/2015 Document Reviewed: 12/19/2014 Elsevier Interactive Patient Education  2017 [place] Prevention in the Home Falls can cause injuries. They can happen to people of all ages. There are many things you can do to make your home safe and to help prevent falls. What can I do on the outside of my home?  Regularly fix the edges of walkways and driveways and fix any cracks.  Remove anything that might make you trip as you walk through a door, such as a raised step or threshold.  Trim any bushes or trees on the path to your home.  Use bright outdoor lighting.  Clear any walking paths of anything that might make someone trip, such as rocks or tools.  Regularly check to see if handrails are loose or broken. Make sure that both sides of any steps have handrails.  Any raised decks and porches should have guardrails on the edges.  Have any leaves, snow, or ice cleared regularly.  Use sand or salt on walking paths during winter.  Clean up any spills in your garage right away. This includes oil or grease spills. What can I do in the bathroom?  Use night lights.  Install grab bars by the toilet and in the tub and shower. Do not use towel bars as grab bars.  Use non-skid mats or decals in the tub or shower.  If you need to sit down in the shower, use a plastic, non-slip stool.  Keep the floor dry. Clean up any water that spills on the floor as soon as it happens.  Remove soap buildup in the tub or  shower regularly.  Attach bath mats securely with double-sided non-slip rug tape.  Do not have throw rugs and other things on the floor that can make you trip. What can I do in the bedroom?  Use night lights.  Make sure that you have a light by your bed that is easy to reach.  Do not use any sheets or blankets that are too big for your bed. They should not hang down onto the floor.  Have a firm chair that has side arms. You can use this for support while you get dressed.  Do not have throw rugs and other things on the floor that can make you trip. What can I do in the kitchen?  Clean up any spills right away.  Avoid walking on wet floors.  Keep items that you use a lot in easy-to-reach places.  If you need to reach something above you, use a strong step stool that has a grab bar.  Keep electrical cords out of the way.  Do not use floor polish or wax that makes floors slippery. If you must use wax, use non-skid floor wax.  Do not have throw rugs and other things on the floor that can make you trip.  What can I do with my stairs?  Do not leave any items on the stairs.  Make sure that there are handrails on both sides of the stairs and use them. Fix handrails that are broken or loose. Make sure that handrails are as long as the stairways.  Check any carpeting to make sure that it is firmly attached to the stairs. Fix any carpet that is loose or worn.  Avoid having throw rugs at the top or bottom of the stairs. If you do have throw rugs, attach them to the floor with carpet tape.  Make sure that you have a light switch at the top of the stairs and the bottom of the stairs. If you do not have them, ask someone to add them for you. What else can I do to help prevent falls?  Wear shoes that:  Do not have high heels.  Have rubber bottoms.  Are comfortable and fit you well.  Are closed at the toe. Do not wear sandals.  If you use a stepladder:  Make sure that it is fully  opened. Do not climb a closed stepladder.  Make sure that both sides of the stepladder are locked into place.  Ask someone to hold it for you, if possible.  Clearly mark and make sure that you can see:  Any grab bars or handrails.  First and last steps.  Where the edge of each step is.  Use tools that help you move around (mobility aids) if they are needed. These include:  Canes.  Walkers.  Scooters.  Crutches.  Turn on the lights when you go into a dark area. Replace any light bulbs as soon as they burn out.  Set up your furniture so you have a clear path. Avoid moving your furniture around.  If any of your floors are uneven, fix them.  If there are any pets around you, be aware of where they are.  Review your medicines with your doctor. Some medicines can make you feel dizzy. This can increase your chance of falling. Ask your doctor what other things that you can do to help prevent falls. This information is not intended to replace advice given to you by your health care provider. Make sure you discuss any questions you have with your health care provider. Document Released: 12/14/2008 Document Revised: 07/26/2015 Document Reviewed: 03/24/2014 Elsevier Interactive Patient Education  2017 [employer].

## 2020-05-16 ENCOUNTER — Ambulatory Visit: Payer: PPO | Admitting: Podiatry

## 2020-05-16 ENCOUNTER — Encounter: Payer: Self-pay | Admitting: Podiatry

## 2020-05-16 ENCOUNTER — Other Ambulatory Visit: Payer: Self-pay

## 2020-05-16 DIAGNOSIS — Z79899 Other long term (current) drug therapy: Secondary | ICD-10-CM | POA: Diagnosis not present

## 2020-05-16 DIAGNOSIS — L603 Nail dystrophy: Secondary | ICD-10-CM

## 2020-05-16 DIAGNOSIS — M722 Plantar fascial fibromatosis: Secondary | ICD-10-CM

## 2020-05-16 MED ORDER — TERBINAFINE HCL 250 MG PO TABS: 250.0000 mg | ORAL_TABLET | Freq: Every day | ORAL | 0 refills | Status: DC

## 2020-05-16 MED ORDER — TRIAMCINOLONE ACETONIDE 40 MG/ML IJ SUSP
20.0000 mg | Freq: Once | INTRAMUSCULAR | Status: AC
  Administered 2020-05-16: 20 mg

## 2020-05-16 NOTE — Progress Notes (Signed)
He presents today for follow-up of his painful toenails.  He denies fever chills nausea vomiting muscle aches pains calf pain back pain chest pain shortness of breath itching or rashes while taking Lamisil for the first 30 days.  He is also complaining of pain to the medial aspect of his left foot.  Objective: Vital signs are stable he is alert and oriented x3.  Ulcers are palpable.  Nails are unchanged at this point.  He has pain on palpation medial calcaneal tubercle of the left heel which he was not aware of.  He does have some tenderness of the tibialis anterior and of the posterior tibial tendon as a past the joint.  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  Long-term therapy with Lamisil secondary to onychomycosis.  Plan: Wrote a prescription for another 90 tablets of Lamisil.  Requesting a CMP.  Injected his left heel today 20 mg Kenalog 5 mg Marcaine point of maximal tenderness.  And placement of plantar fascial brace.  Follow-up with him in 4 months

## 2020-05-17 LAB — COMPREHENSIVE METABOLIC PANEL
ALT: 22 IU/L (ref 0–44)
AST: 21 IU/L (ref 0–40)
Albumin/Globulin Ratio: 1.7 (ref 1.2–2.2)
Albumin: 4.4 g/dL (ref 3.7–4.7)
Alkaline Phosphatase: 93 IU/L (ref 44–121)
BUN/Creatinine Ratio: 13 (ref 10–24)
BUN: 17 mg/dL (ref 8–27)
Bilirubin Total: 0.5 mg/dL (ref 0.0–1.2)
CO2: 20 mmol/L (ref 20–29)
Calcium: 9.3 mg/dL (ref 8.6–10.2)
Chloride: 103 mmol/L (ref 96–106)
Creatinine, Ser: 1.29 mg/dL — ABNORMAL HIGH (ref 0.76–1.27)
Globulin, Total: 2.6 g/dL (ref 1.5–4.5)
Glucose: 99 mg/dL (ref 65–99)
Potassium: 4.4 mmol/L (ref 3.5–5.2)
Sodium: 144 mmol/L (ref 134–144)
Total Protein: 7 g/dL (ref 6.0–8.5)
eGFR: 59 mL/min/{1.73_m2} — ABNORMAL LOW (ref >59)

## 2020-05-21 ENCOUNTER — Telehealth: Payer: Self-pay

## 2020-05-21 NOTE — Telephone Encounter (Signed)
-----   Message from Garrel Ridgel, [place] sent at 05/17/2020  6:55 AM EDT ----- Blood work looks good.  Continue medication.

## 2020-05-21 NOTE — Telephone Encounter (Signed)
Left vm to continue medication as directed per Dr. Milinda Pointer

## 2020-06-13 ENCOUNTER — Ambulatory Visit: Payer: PPO | Admitting: Cardiovascular Disease

## 2020-06-14 NOTE — Progress Notes (Signed)
Cardiology Office Note  Date:  06/15/2020   ID:  Neil Bush, DOB [birth year], MRN [id number]  PCP:  [name], DO   Chief Complaint  Patient presents with  . 6 month follow up     Patient c/o chest pain, shortness of breath & A-fib spells. Medications reviewed by the patient verbally.     HPI:  70 year old male with a prior history of  paroxysmal atrial fibrillation, 01/26/2016, 2020 CHA2DS2V 6 ASc score of 2 (HTN, agex1).  hypertension,  hyperlipidemia,  chest pain , Stress test September 30, 2018 No ischemia low risk study CT calcium score 0 who presents for follow-up of his paroxysmal atrial fibrillation  Last seen in the office by myself on May 2021 Since then has been seen by one of our providers, October 2021 At that time reported episode of chest tightness, transient, resolved with deep breathing and times  CT coronary calcium score of 0 November 2021  Atrial fibrillation last week Friday in to Saturday Typically takes extra half dose metoprolol and symptoms currently Otherwise has not had recent episode  Active, works 3 days a  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  Felt his pulse, appreciated it was rapid and irregular Symptoms started approximately 6:30 AM and persisted Noted to be in atrial fibrillation with RVR rate 140 bpm He presented to the emergency room, converted to normal sinus rhythm around 11:30 AM tnt 200 Converted 2 normal sinus rhythm  after 5 hours in the emergency room  EKG from the  emergency room showing confirming atrial fibrillation ventricular rate 140 bpm Stress test September 30, 2018 No ischemia low risk study  November 2017 he was admitted with chest pain and A. fib and converted to sinus rhythm on IV diltiazem.  Started on Xarelto in the setting of a CHA2DS2VASc of 2.   stress testing which did not show ischemia.  December 2017 Echo showed normal LV function.    PMH:   has a past medical history of Chest pain, Dysrhythmia, Headache, Hyperlipidemia, Hypertension, PAF (paroxysmal atrial fibrillation) ([place]), Palpitations, and Skin cancer.  PSH:    Past Surgical History:  Procedure Laterality Date  . CARDIAC CATHETERIZATION     [facility]   . CHOLECYSTECTOMY    . COLONOSCOPY WITH PROPOFOL N/A 04/17/2017   Procedure: COLONOSCOPY WITH PROPOFOL;  Surgeon: Lucilla Lame, MD;  Location: [facility];  Service: Endoscopy;  Laterality: N/A;  . EYE SURGERY    . FOOT SURGERY    . HERNIA REPAIR     X 2 ingunial  . KNEE SURGERY Right   . POLYPECTOMY  04/17/2017   Procedure: POLYPECTOMY INTESTINAL;  Surgeon: Lucilla Lame, MD;  Location: [place];  Service: Endoscopy;;    Current Outpatient Medications  Medication Sig Dispense Refill  . atorvastatin (LIPITOR) 40 MG tablet Take 0.5 tablets (20 mg total) by mouth daily at 6 PM. 45 tablet 1  . lisinopril (ZESTRIL) 40 MG tablet Take  0.5 tablets (20 mg total) by mouth daily. 45 tablet 1  . metoprolol succinate (TOPROL-XL) 50 MG 24 hr tablet Take 0.5 tablets (25 mg total) by mouth daily. Take with or immediately following a meal. 45 tablet 3  . rivaroxaban (XARELTO) 20 MG TABS tablet Take 1 tablet (20 mg total) by mouth daily. 90 tablet 0   No current facility-administered medications for this visit.    Allergies:   Penicillins   Social History:  The patient  reports that he has never smoked. He has never used smokeless tobacco. He reports that he does not  drink alcohol and does not use drugs.   Family History:   family history includes Cancer in his mother; Diabetes in his father; Heart disease in his brother and father; Hypertension in his father.    Review of Systems: Review of Systems  Constitutional: Negative.   HENT: Negative.   Respiratory: Negative.   Cardiovascular: Negative.   Gastrointestinal: Negative.   Musculoskeletal: Negative.   Neurological: Negative.   Psychiatric/Behavioral: Negative.   All other systems reviewed and are negative.   PHYSICAL EXAM: VS:  BP 130/78 (BP Location: Left Arm, Patient Position: Sitting, Cuff Size: Normal)   Pulse (!) 59   Ht 5\' 10"  (1.778 m)   Wt 179 lb 2 oz (81.3 kg)   SpO2 96%   BMI 25.70 kg/m  , BMI Body mass index is 25.7 kg/m. Constitutional:  oriented to person, place, and time. No distress.  HENT:  Head: Grossly normal Eyes:  no discharge. No scleral icterus.  Neck: No JVD, no carotid bruits  Cardiovascular: Regular rate and rhythm, no murmurs appreciated Pulmonary/Chest: Clear to auscultation bilaterally, no wheezes or rails Abdominal: Soft.  no distension.  no tenderness.  Musculoskeletal: Normal range of motion Neurological:  normal muscle tone. Coordination normal. No atrophy Skin: Skin warm and dry Psychiatric: normal affect, pleasant   Recent Labs: 04/13/2020: Hemoglobin 16.9; Platelets 228; TSH 1.950 05/16/2020: ALT 22; BUN 17; Creatinine, Ser 1.29; Potassium 4.4; Sodium 144    Lipid Panel Lab Results  Component Value Date   CHOL 147 04/13/2020   HDL 44 04/13/2020   LDLCALC 80 04/13/2020   TRIG 128 04/13/2020      Wt Readings from Last 3 Encounters:  06/15/20 179 lb 2 oz (81.3 kg)  05/04/20 180 lb (81.6 kg)  04/13/20 179 lb 6.4 oz (81.4 kg)      ASSESSMENT AND PLAN:  Paroxysmal atrial fibrillation (HCC) - Continue Xarelto 20 mg daily,  metoprolol succinate 25 mg daily Given breakthrough episodes we will give him flecainide 100 mg to take as  needed with his extra half metoprolol  Essential hypertension Continue metoprolol lisinopril Blood pressure well controlled  Mixed hyperlipidemia On Lipitor, takes 20 mg daily Coronary calcium score 0  Chest pain, unspecified type In the past has had chest pain in the setting of atrial fibrillation, calcium score 0, prior negative stress test No recent chest pain  DOT clearance Clearance can be given if needed   Total encounter time more than 25 minutes  Greater than 50% was spent in counseling and coordination of care with the patient    No orders of the defined types were placed in this encounter.    Signed, Esmond Plants, M.D., Ph.D. 06/15/2020  [place], [place]

## 2020-06-15 ENCOUNTER — Encounter: Payer: Self-pay | Admitting: Cardiovascular Disease

## 2020-06-15 ENCOUNTER — Other Ambulatory Visit: Payer: Self-pay

## 2020-06-15 ENCOUNTER — Ambulatory Visit: Payer: PPO | Admitting: Cardiovascular Disease

## 2020-06-15 VITALS — BP 130/78 | HR 59 | Ht 70.0 in | Wt 179.1 lb

## 2020-06-15 DIAGNOSIS — I10 Essential (primary) hypertension: Secondary | ICD-10-CM | POA: Diagnosis not present

## 2020-06-15 DIAGNOSIS — I480 Paroxysmal atrial fibrillation: Secondary | ICD-10-CM

## 2020-06-15 DIAGNOSIS — R0600 Dyspnea, unspecified: Secondary | ICD-10-CM | POA: Diagnosis not present

## 2020-06-15 DIAGNOSIS — R079 Chest pain, unspecified: Secondary | ICD-10-CM | POA: Diagnosis not present

## 2020-06-15 DIAGNOSIS — E785 Hyperlipidemia, unspecified: Secondary | ICD-10-CM | POA: Diagnosis not present

## 2020-06-15 DIAGNOSIS — I351 Nonrheumatic aortic (valve) insufficiency: Secondary | ICD-10-CM

## 2020-06-15 DIAGNOSIS — E7800 Pure hypercholesterolemia, unspecified: Secondary | ICD-10-CM | POA: Diagnosis not present

## 2020-06-15 MED ORDER — RIVAROXABAN 20 MG PO TABS: ORAL_TABLET | ORAL | 3 refills | Status: DC

## 2020-06-15 MED ORDER — FLECAINIDE ACETATE 100 MG PO TABS: 100.0000 mg | ORAL_TABLET | Freq: Two times a day (BID) | ORAL | 3 refills | Status: AC | PRN

## 2020-06-15 MED ORDER — ATORVASTATIN CALCIUM 40 MG PO TABS: 20.0000 mg | ORAL_TABLET | Freq: Every day | ORAL | 3 refills | Status: DC

## 2020-06-15 MED ORDER — LISINOPRIL 20 MG PO TABS: 20.0000 mg | ORAL_TABLET | Freq: Every day | ORAL | 3 refills | Status: DC

## 2020-06-15 MED ORDER — FLECAINIDE ACETATE 100 MG PO TABS
100.0000 mg | ORAL_TABLET | Freq: Two times a day (BID) | ORAL | 1 refills | Status: DC | PRN
Start: 2020-06-15 — End: 2020-06-15

## 2020-06-15 MED ORDER — METOPROLOL SUCCINATE ER 50 MG PO TB24: 25.0000 mg | ORAL_TABLET | Freq: Every day | ORAL | 3 refills | Status: DC

## 2020-06-15 NOTE — Patient Instructions (Addendum)
Medication Instructions:  No changes  For atrial fib spell >30 min, Take flecainide 100 mg x1 with extra 1/2 metoprolol   Ask pharmacy when [facility] goes generic  If you need a refill on your cardiac medications before your next appointment, please call your pharmacy.    Lab work: No new labs needed   If you have labs (blood work) drawn today and your tests are completely normal, you will receive your results only by: Marland Kitchen MyChart Message (if you have MyChart) OR . A paper copy in the mail If you have any lab test that is abnormal or we need to change your treatment, we will call you to review the results.   Testing/Procedures: No new testing needed   Follow-Up: At [facility], you and your health needs are our priority.  As part of our continuing mission to provide you with exceptional heart care, we have created designated Provider Care Teams.  These Care Teams include your primary Cardiologist (physician) and Advanced Practice Providers (APPs -  Physician Assistants and Nurse Practitioners) who all work together to provide you with the care you need, when you need it.  . You will need a follow up appointment in 12 months  . Providers on your designated Care Team:   . Murray Hodgkins, NP . Christell Faith, PA-C . Marrianne Mood, PA-C  Any Other Special Instructions Will Be Listed Below (If Applicable).  COVID-19 Vaccine Information can be found at: ShippingScam.co.uk For questions related to vaccine distribution or appointments, please email [email or web address] or call [phone].

## 2020-06-15 NOTE — Addendum Note (Signed)
Addended by: Wynema Birch on: 06/15/2020 09:19 AM   Modules accepted: Orders

## 2020-07-02 NOTE — Progress Notes (Signed)
error 

## 2020-09-26 ENCOUNTER — Other Ambulatory Visit: Payer: Self-pay

## 2020-09-26 ENCOUNTER — Encounter: Payer: Self-pay | Admitting: Podiatry

## 2020-09-26 ENCOUNTER — Ambulatory Visit: Payer: PPO | Admitting: Podiatry

## 2020-09-26 DIAGNOSIS — L603 Nail dystrophy: Secondary | ICD-10-CM

## 2020-09-26 DIAGNOSIS — M722 Plantar fascial fibromatosis: Secondary | ICD-10-CM

## 2020-09-26 MED ORDER — TRIAMCINOLONE ACETONIDE 40 MG/ML IJ SUSP: 20.0000 mg | Freq: Once | INTRAMUSCULAR | Status: AC

## 2020-09-26 MED ORDER — TERBINAFINE HCL 250 MG PO TABS: 250.0000 mg | ORAL_TABLET | Freq: Every day | ORAL | 0 refills | Status: DC

## 2020-09-26 NOTE — Progress Notes (Signed)
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  He states that the Lamisil seems to be helping the toenail grow out and he was doing great for about 3-1/2 months and most recently the plantar fashion has started become painful again.   Objective: Vital signs are stable alert oriented x3 hallux nail left appears to be 50% grown out.  He also has pain on palpation medial calcaneal tubercle of the left heel.  Assessment: Plan fasciitis left.  Resolving onychomycosis.  Plan: Instructed him to take Lamisil 1 tablet every other day for the next 60 days and I will follow-up with him in 3 months.  I also recommended another injection to the left heel which I provided him today 20 mg Kenalog and 10 mg of local anesthetic.  Tolerated procedure well and I will follow-up with him in 3 months.

## 2020-10-01 ENCOUNTER — Other Ambulatory Visit: Payer: Self-pay | Admitting: Family Medicine

## 2020-10-01 NOTE — Telephone Encounter (Signed)
Requested medication (s) are due for refill today: Yes  Requested medication (s) are on the active medication list: Yes  Last refill:  06/15/20  Future visit scheduled: Yes  Notes to clinic:  Last ordered by Dr. Rockey Situ.    Requested Prescriptions  Pending Prescriptions Disp Refills   XARELTO 20 MG TABS tablet [Pharmacy Med Name: XARELTO 20 MG TABLET] 90 tablet 0    Sig: Take 1 tablet (20 mg total) by mouth daily.      Hematology: Anticoagulants - rivaroxaban Failed - 10/01/2020 11:19 AM      Failed - Cr in normal range and within 360 days    Creatinine  Date Value Ref Range Status  10/30/2011 1.05 0.60 - 1.30 mg/dL Final   Creatinine, Ser  Date Value Ref Range Status  05/16/2020 1.29 (H) 0.76 - 1.27 mg/dL Final          Passed - ALT in normal range and within 180 days    ALT  Date Value Ref Range Status  05/16/2020 22 0 - 44 IU/L Final   ALT (SGPT) Piccolo, Waived  Date Value Ref Range Status  10/15/2018 24 10 - 47 U/L Final          Passed - AST in normal range and within 180 days    AST  Date Value Ref Range Status  05/16/2020 21 0 - 40 IU/L Final   AST (SGOT) Piccolo, Waived  Date Value Ref Range Status  10/15/2018 22 11 - 38 U/L Final          Passed - HCT in normal range and within 360 days    Hematocrit  Date Value Ref Range Status  04/13/2020 50.2 37.5 - 51.0 % Final          Passed - HGB in normal range and within 360 days    Hemoglobin  Date Value Ref Range Status  04/13/2020 16.9 13.0 - 17.7 g/dL Final          Passed - PLT in normal range and within 360 days    Platelets  Date Value Ref Range Status  04/13/2020 228 150 - 450 x10E3/uL Final          Passed - Valid encounter within last 12 months    Recent Outpatient Visits           5 months ago Routine general medical examination at a health care facility   [facility], Megan P, DO   10 months ago Migraine with aura and without status migrainosus, not  intractable   [facility] Kathrine Haddock, NP   1 year ago Paroxysmal atrial fibrillation [facility])   [place], [place], DO   1 year ago Wellness examination   [place], [place], DO   1 year ago Essential hypertension   [place], [place], [place]       Future Appointments             In 1 week Wynetta Emery, Barb Merino, DO [employer], [place]   In 7 months  [employer], [place]

## 2020-10-01 NOTE — Telephone Encounter (Signed)
Called and LVM asking for patient to please return my call. Need to find out if he has enough medication to get to next appointment.

## 2020-10-02 ENCOUNTER — Other Ambulatory Visit: Payer: Self-pay | Admitting: Cardiovascular Disease

## 2020-10-02 NOTE — Telephone Encounter (Signed)
Called and spoke with patient. He states that he gets this filled through Cardiology and has 6 tablets left. I advised the patient to contact his pharmacy to see if he has refills and if not, to contact Cardiology and request a refill so he doesn't run out. Patient verbalized understanding.

## 2020-10-02 NOTE — Telephone Encounter (Signed)
Prescription refill request for Eliquis received. Indication:AFIB Last office visit:GOLLAN 06/15/20 Scr:1.29 05/16/20 Age: 84M Weight:81.3KG

## 2020-10-02 NOTE — Telephone Encounter (Signed)
Refill Request.  

## 2020-10-11 ENCOUNTER — Ambulatory Visit (INDEPENDENT_AMBULATORY_CARE_PROVIDER_SITE_OTHER): Payer: PPO | Admitting: Family Medicine

## 2020-10-11 ENCOUNTER — Encounter: Payer: Self-pay | Admitting: Family Medicine

## 2020-10-11 ENCOUNTER — Other Ambulatory Visit: Payer: Self-pay

## 2020-10-11 VITALS — BP 138/83 | HR 55 | Temp 98.0°F | Ht 69.0 in | Wt 185.0 lb

## 2020-10-11 DIAGNOSIS — I10 Essential (primary) hypertension: Secondary | ICD-10-CM

## 2020-10-11 DIAGNOSIS — E782 Mixed hyperlipidemia: Secondary | ICD-10-CM

## 2020-10-11 NOTE — Assessment & Plan Note (Signed)
Under good control on current regimen. Continue current regimen. Continue to monitor. Call with any concerns. Refills up to date through cardiology.

## 2020-10-11 NOTE — Progress Notes (Signed)
 BP 138/83   Pulse (!) 55   Temp 98 F (36.7 C) (Oral)   Ht 5' 9" (1.753 m)   Wt 185 lb (83.9 kg)   SpO2 95%   BMI 27.32 kg/m    Subjective:    Patient ID: Neil Bush, male    DOB: 07/20/1950, 70 y.o.   MRN: [id number]  HPI: Neil Bush is a 70 y.o. male  Chief Complaint  Patient presents with   Hyperlipidemia   Hypertension   HYPERTENSION / HYPERLIPIDEMIA Satisfied with current treatment? yes Duration of hypertension: chronic BP monitoring frequency: not checking BP medication side effects: no Past BP meds: metoprolol, lisinopril,  Duration of hyperlipidemia: chronic Cholesterol medication side effects: yes- myagias, taking it MWF Cholesterol supplements: none Past cholesterol medications: atorvastatin Medication compliance: excellent compliance Aspirin: no Recent stressors: no Recurrent headaches: no Visual changes: no Palpitations: no Dyspnea: no Chest pain: no Lower extremity edema: no Dizzy/lightheaded: no  Relevant past medical, surgical, family and social history reviewed and updated as indicated. Interim medical history since our last visit reviewed. Allergies and medications reviewed and updated.  Review of Systems  Constitutional: Negative.   Respiratory: Negative.    Cardiovascular: Negative.   Gastrointestinal: Negative.   Musculoskeletal: Negative.   Neurological: Negative.   Psychiatric/Behavioral: Negative.     Per HPI unless specifically indicated above     Objective:    BP 138/83   Pulse (!) 55   Temp 98 F (36.7 C) (Oral)   Ht 5' 9" (1.753 m)   Wt 185 lb (83.9 kg)   SpO2 95%   BMI 27.32 kg/m   Wt Readings from Last 3 Encounters:  10/11/20 185 lb (83.9 kg)  06/15/20 179 lb 2 oz (81.3 kg)  05/04/20 180 lb (81.6 kg)    Physical Exam Vitals and nursing note reviewed.  Constitutional:      General: He is not in acute distress.    Appearance: Normal appearance. He is not ill-appearing, toxic-appearing or diaphoretic.   HENT:     Head: Normocephalic and atraumatic.     Right Ear: External ear normal.     Left Ear: External ear normal.     Nose: Nose normal.     Mouth/Throat:     Mouth: Mucous membranes are moist.     Pharynx: Oropharynx is clear.  Eyes:     General: No scleral icterus.       Right eye: No discharge.        Left eye: No discharge.     Extraocular Movements: Extraocular movements intact.     Conjunctiva/sclera: Conjunctivae normal.     Pupils: Pupils are equal, round, and reactive to light.  Cardiovascular:     Rate and Rhythm: Normal rate and regular rhythm.     Pulses: Normal pulses.     Heart sounds: Normal heart sounds. No murmur heard.   No friction rub. No gallop.  Pulmonary:     Effort: Pulmonary effort is normal. No respiratory distress.     Breath sounds: Normal breath sounds. No stridor. No wheezing, rhonchi or rales.  Chest:     Chest wall: No tenderness.  Musculoskeletal:        General: Normal range of motion.     Cervical back: Normal range of motion and neck supple.  Skin:    General: Skin is warm and dry.     Capillary Refill: Capillary refill takes less than 2 seconds.     Coloration: Skin   is not jaundiced or pale.     Findings: No bruising, erythema, lesion or rash.  Neurological:     General: No focal deficit present.     Mental Status: He is alert and oriented to person, place, and time. Mental status is at baseline.  Psychiatric:        Mood and Affect: Mood normal.        Behavior: Behavior normal.        Thought Content: Thought content normal.        Judgment: Judgment normal.    Results for orders placed or performed in visit on 05/16/20  Comprehensive metabolic panel  Result Value Ref Range   Glucose 99 65 - 99 mg/dL   BUN 17 8 - 27 mg/dL   Creatinine, Ser 1.29 (H) 0.76 - 1.27 mg/dL   eGFR 59 (L) >59 mL/min/1.73   BUN/Creatinine Ratio 13 10 - 24   Sodium 144 134 - 144 mmol/L   Potassium 4.4 3.5 - 5.2 mmol/L   Chloride 103 96 - 106 mmol/L    CO2 20 20 - 29 mmol/L   Calcium 9.3 8.6 - 10.2 mg/dL   Total Protein 7.0 6.0 - 8.5 g/dL   Albumin 4.4 3.7 - 4.7 g/dL   Globulin, Total 2.6 1.5 - 4.5 g/dL   Albumin/Globulin Ratio 1.7 1.2 - 2.2   Bilirubin Total 0.5 0.0 - 1.2 mg/dL   Alkaline Phosphatase 93 44 - 121 IU/L   AST 21 0 - 40 IU/L   ALT 22 0 - 44 IU/L      Assessment & Plan:   Problem List Items Addressed This Visit       Cardiovascular and Mediastinum   Essential hypertension    Under good control on current regimen. Continue current regimen. Continue to monitor. Call with any concerns. Refills up to date through cardiology.       Relevant Orders   Comprehensive metabolic panel     Other   Hyperlipidemia - Primary    Under good control on current regimen. Continue current regimen. Continue to monitor. Call with any concerns. Refills up to date through cardiology.      Relevant Orders   Comprehensive metabolic panel   Lipid Panel w/o Chol/HDL Ratio     Follow up plan: Return in about 6 months (around 04/13/2021) for physical.       

## 2020-10-12 ENCOUNTER — Encounter: Payer: Self-pay | Admitting: Family Medicine

## 2020-10-12 LAB — COMPREHENSIVE METABOLIC PANEL
ALT: 13 IU/L (ref 0–44)
AST: 16 IU/L (ref 0–40)
Albumin/Globulin Ratio: 2 (ref 1.2–2.2)
Albumin: 4.3 g/dL (ref 3.7–4.7)
Alkaline Phosphatase: 83 IU/L (ref 44–121)
BUN/Creatinine Ratio: 15 (ref 10–24)
BUN: 17 mg/dL (ref 8–27)
Bilirubin Total: 0.6 mg/dL (ref 0.0–1.2)
CO2: 23 mmol/L (ref 20–29)
Calcium: 8.7 mg/dL (ref 8.6–10.2)
Chloride: 103 mmol/L (ref 96–106)
Creatinine, Ser: 1.17 mg/dL (ref 0.76–1.27)
Globulin, Total: 2.2 g/dL (ref 1.5–4.5)
Glucose: 88 mg/dL (ref 65–99)
Potassium: 4.3 mmol/L (ref 3.5–5.2)
Sodium: 141 mmol/L (ref 134–144)
Total Protein: 6.5 g/dL (ref 6.0–8.5)
eGFR: 66 mL/min/{1.73_m2} (ref >59)

## 2020-10-12 LAB — LIPID PANEL W/O CHOL/HDL RATIO
Cholesterol, Total: 163 mg/dL (ref 100–199)
HDL: 45 mg/dL (ref >39)
LDL Chol Calc (NIH): 98 mg/dL (ref 0–99)
Triglycerides: 108 mg/dL (ref 0–149)
VLDL Cholesterol Cal: 20 mg/dL (ref 5–40)

## 2020-10-25 DIAGNOSIS — C44619 Basal cell carcinoma of skin of left upper limb, including shoulder: Secondary | ICD-10-CM | POA: Diagnosis not present

## 2020-10-25 DIAGNOSIS — X32XXXA Exposure to sunlight, initial encounter: Secondary | ICD-10-CM | POA: Diagnosis not present

## 2020-10-25 DIAGNOSIS — D2261 Melanocytic nevi of right upper limb, including shoulder: Secondary | ICD-10-CM | POA: Diagnosis not present

## 2020-10-25 DIAGNOSIS — D485 Neoplasm of uncertain behavior of skin: Secondary | ICD-10-CM | POA: Diagnosis not present

## 2020-10-25 DIAGNOSIS — L570 Actinic keratosis: Secondary | ICD-10-CM | POA: Diagnosis not present

## 2020-10-25 DIAGNOSIS — D225 Melanocytic nevi of trunk: Secondary | ICD-10-CM | POA: Diagnosis not present

## 2020-10-25 DIAGNOSIS — L821 Other seborrheic keratosis: Secondary | ICD-10-CM | POA: Diagnosis not present

## 2020-10-25 DIAGNOSIS — Z85828 Personal history of other malignant neoplasm of skin: Secondary | ICD-10-CM | POA: Diagnosis not present

## 2020-12-06 DIAGNOSIS — C44619 Basal cell carcinoma of skin of left upper limb, including shoulder: Secondary | ICD-10-CM | POA: Diagnosis not present

## 2020-12-26 ENCOUNTER — Ambulatory Visit: Payer: PPO | Admitting: Podiatry

## 2021-02-01 ENCOUNTER — Other Ambulatory Visit: Payer: Self-pay | Admitting: Ophthalmology

## 2021-02-01 ENCOUNTER — Other Ambulatory Visit (HOSPITAL_COMMUNITY): Payer: Self-pay | Admitting: Ophthalmology

## 2021-02-01 DIAGNOSIS — G43101 Migraine with aura, not intractable, with status migrainosus: Secondary | ICD-10-CM

## 2021-02-01 DIAGNOSIS — G43109 Migraine with aura, not intractable, without status migrainosus: Secondary | ICD-10-CM | POA: Diagnosis not present

## 2021-02-01 DIAGNOSIS — Z1810 Retained metal fragments, unspecified: Secondary | ICD-10-CM

## 2021-02-08 ENCOUNTER — Other Ambulatory Visit: Payer: Self-pay | Admitting: Ophthalmology

## 2021-02-08 DIAGNOSIS — G43101 Migraine with aura, not intractable, with status migrainosus: Secondary | ICD-10-CM

## 2021-02-11 ENCOUNTER — Ambulatory Visit: Payer: PPO

## 2021-02-11 ENCOUNTER — Ambulatory Visit: Admission: RE | Admit: 2021-02-11 | Payer: PPO | Source: Ambulatory Visit

## 2021-02-19 DIAGNOSIS — L820 Inflamed seborrheic keratosis: Secondary | ICD-10-CM | POA: Diagnosis not present

## 2021-02-19 DIAGNOSIS — L538 Other specified erythematous conditions: Secondary | ICD-10-CM | POA: Diagnosis not present

## 2021-03-06 ENCOUNTER — Ambulatory Visit: Admission: RE | Admit: 2021-03-06 | Payer: PPO | Source: Ambulatory Visit

## 2021-03-22 ENCOUNTER — Ambulatory Visit
Admission: RE | Admit: 2021-03-22 | Discharge: 2021-03-22 | Disposition: A | Discharge status: Home / Self Care | Payer: PPO | Source: Ambulatory Visit | Attending: Ophthalmology | Admitting: Ophthalmology

## 2021-03-22 ENCOUNTER — Other Ambulatory Visit: Payer: Self-pay

## 2021-03-22 DIAGNOSIS — R519 Headache, unspecified: Secondary | ICD-10-CM | POA: Diagnosis not present

## 2021-03-22 DIAGNOSIS — G43101 Migraine with aura, not intractable, with status migrainosus: Secondary | ICD-10-CM | POA: Diagnosis not present

## 2021-03-22 DIAGNOSIS — R918 Other nonspecific abnormal finding of lung field: Secondary | ICD-10-CM | POA: Diagnosis not present

## 2021-03-22 DIAGNOSIS — Z01818 Encounter for other preprocedural examination: Secondary | ICD-10-CM | POA: Diagnosis not present

## 2021-03-22 DIAGNOSIS — Z1810 Retained metal fragments, unspecified: Secondary | ICD-10-CM

## 2021-03-22 LAB — POCT I-STAT CREATININE: Creatinine, Ser: 1.5 mg/dL — ABNORMAL HIGH (ref 0.61–1.24)

## 2021-03-22 MED ORDER — IOHEXOL 350 MG/ML SOLN
75.0000 mL | Freq: Once | INTRAVENOUS | Status: AC | PRN
  Administered 2021-03-22: 75 mL via INTRAVENOUS

## 2021-04-08 ENCOUNTER — Emergency Department
Admission: EM | Admit: 2021-04-08 | Discharge: 2021-04-08 | Disposition: A | Discharge status: Home / Self Care | Payer: PPO | Attending: Emergency Medicine | Admitting: Emergency Medicine

## 2021-04-08 ENCOUNTER — Other Ambulatory Visit: Payer: Self-pay

## 2021-04-08 ENCOUNTER — Emergency Department: Payer: PPO

## 2021-04-08 DIAGNOSIS — Z85828 Personal history of other malignant neoplasm of skin: Secondary | ICD-10-CM | POA: Diagnosis not present

## 2021-04-08 DIAGNOSIS — I700 Atherosclerosis of aorta: Secondary | ICD-10-CM | POA: Diagnosis not present

## 2021-04-08 DIAGNOSIS — R109 Unspecified abdominal pain: Secondary | ICD-10-CM | POA: Diagnosis not present

## 2021-04-08 DIAGNOSIS — R1013 Epigastric pain: Secondary | ICD-10-CM

## 2021-04-08 DIAGNOSIS — I10 Essential (primary) hypertension: Secondary | ICD-10-CM | POA: Diagnosis not present

## 2021-04-08 LAB — URINALYSIS, ROUTINE W REFLEX MICROSCOPIC
Bacteria, UA: NONE SEEN
Bilirubin Urine: NEGATIVE
Glucose, UA: NEGATIVE mg/dL
Ketones, ur: NEGATIVE mg/dL
Leukocytes,Ua: NEGATIVE
Nitrite: NEGATIVE
Specific Gravity, Urine: 1.025 (ref 1.005–1.030)
Squamous Epithelial / HPF: NONE SEEN (ref 0–5)
pH: 5.5 (ref 5.0–8.0)

## 2021-04-08 LAB — COMPREHENSIVE METABOLIC PANEL
ALT: 19 U/L (ref 0–44)
AST: 20 U/L (ref 15–41)
Albumin: 4.4 g/dL (ref 3.5–5.0)
Alkaline Phosphatase: 66 U/L (ref 38–126)
Anion gap: 7 (ref 5–15)
BUN: 27 mg/dL — ABNORMAL HIGH (ref 8–23)
CO2: 26 mmol/L (ref 22–32)
Calcium: 9.7 mg/dL (ref 8.9–10.3)
Chloride: 104 mmol/L (ref 98–111)
Creatinine, Ser: 1.12 mg/dL (ref 0.61–1.24)
GFR, Estimated: >60 mL/min (ref >60)
Glucose, Bld: 130 mg/dL — ABNORMAL HIGH (ref 70–99)
Potassium: 4 mmol/L (ref 3.5–5.1)
Sodium: 137 mmol/L (ref 135–145)
Total Bilirubin: 1 mg/dL (ref 0.3–1.2)
Total Protein: 7.7 g/dL (ref 6.5–8.1)

## 2021-04-08 LAB — CBC
HCT: 48.2 % (ref 39.0–52.0)
Hemoglobin: 17.1 g/dL — ABNORMAL HIGH (ref 13.0–17.0)
MCH: 33.3 pg (ref 26.0–34.0)
MCHC: 35.5 g/dL (ref 30.0–36.0)
MCV: 94 fL (ref 80.0–100.0)
Platelets: 273 10*3/uL (ref 150–400)
RBC: 5.13 MIL/uL (ref 4.22–5.81)
RDW: 13 % (ref 11.5–15.5)
WBC: 14.3 10*3/uL — ABNORMAL HIGH (ref 4.0–10.5)
nRBC: 0 % (ref 0.0–0.2)

## 2021-04-08 LAB — LIPASE, BLOOD: Lipase: 37 U/L (ref 11–51)

## 2021-04-08 LAB — TROPONIN I (HIGH SENSITIVITY)
Troponin I (High Sensitivity): 7 ng/L (ref <18)
Troponin I (High Sensitivity): 6 ng/L (ref <18)

## 2021-04-08 MED ORDER — IOHEXOL 300 MG/ML  SOLN
100.0000 mL | Freq: Once | INTRAMUSCULAR | Status: AC | PRN
  Administered 2021-04-08: 100 mL via INTRAVENOUS

## 2021-04-08 NOTE — ED Triage Notes (Signed)
Patient reports severe abdominal pain and cramping x 4 hours. Denies nausea, vomiting, diarrhea or shortness of breath.

## 2021-04-08 NOTE — Discharge Instructions (Signed)

## 2021-04-08 NOTE — ED Notes (Signed)
Pt to ED for upper abdominal pain that started today. Pt states that it feels like a cramping sensation that comes and goes.  Denies N/V/D.   Pt is A&ox4.

## 2021-04-08 NOTE — ED Notes (Signed)
Verified correct patient and correct discharge papers given. Pt alert and oriented X 4, stable for discharge. RR even and unlabored, color WNL. Discussed discharge instructions and follow-up as directed. Discharge medications discussed, when prescribed. Pt had opportunity to ask questions, and RN available to provide patient and/or family education.   

## 2021-04-08 NOTE — ED Provider Notes (Signed)
[facility] Provider Note    Event Date/Time   First MD Initiated Contact with Patient 04/08/21 0209     (approximate)   History   Abdominal Pain   HPI  Neil Bush is a 71 y.o. male with history as listed below including paroxysmal atrial fibrillation on Xarelto, hypertension, hyperlipidemia, cholecystectomy who presents for evaluation of abdominal pain.  Patient reports that he ate several peanuts earlier today.  His wife told him that the peanuts were spoiled.  Couple of hours after that he started having intermittent sharp cramping severe epigastric abdominal pain.  The pain lasted several hours and resolved without intervention.  No pain at this time.  No chest pain or shortness of breath, no nausea or vomiting, no diarrhea or constipation, no back pain, dysuria or hematuria.     Past Medical History:  Diagnosis Date   Chest pain    a. 01/2016 in setting of Afib;  b. 02/2016 Ex MV: EF 55-65%, no ischemia.   Dysrhythmia    Headache    Hyperlipidemia    Hypertension    PAF (paroxysmal atrial fibrillation) ([place])    a. 01/2016 - admission for rapid AF and c/p-->converted on IV dilt;  b. CHA2DS2VASc = 2-->Xarelto;  c. 01/2016 Echo: EF 55-60%, triv AI.   Palpitations    a. 08/2016 Zio Patch: symptomatic PAC's and brief (max 11 beats) of SVT/PAT (3 episodes).   Skin cancer     Past Surgical History:  Procedure Laterality Date   CARDIAC CATHETERIZATION     [facility]    CHOLECYSTECTOMY     COLONOSCOPY WITH PROPOFOL N/A 04/17/2017   Procedure: COLONOSCOPY WITH PROPOFOL;  Surgeon: Lucilla Lame, MD;  Location: [facility];  Service: Endoscopy;  Laterality: N/A;   EYE SURGERY     FOOT SURGERY     HERNIA REPAIR     X 2 ingunial   KNEE SURGERY Right    POLYPECTOMY  04/17/2017   Procedure: POLYPECTOMY INTESTINAL;  Surgeon: Lucilla Lame, MD;  Location: [facility];  Service: Endoscopy;;     Physical Exam   Triage Vital Signs: ED Triage  Vitals [04/08/21 0142]  Enc Vitals Group     BP (!) 146/124     Pulse Rate 79     Resp 16     Temp 97.6 F (36.4 C)     Temp Source Oral     SpO2 96 %     Weight      Height      Head Circumference      Peak Flow      Pain Score 9     Pain Loc      Pain Edu?      Excl. in [place]?     Most recent vital signs: Vitals:   04/08/21 0245 04/08/21 0300  BP: (!) 139/99 (!) 157/93  Pulse: 66 63  Resp: 20 16  Temp:    SpO2: 94% 93%     Constitutional: Alert and oriented. Well appearing and in no apparent distress. HEENT:      Head: Normocephalic and atraumatic.         Eyes: Conjunctivae are normal. Sclera is non-icteric.       Mouth/Throat: Mucous membranes are moist.       Neck: Supple with no signs of meningismus. Cardiovascular: Regular rate and rhythm. No murmurs, gallops, or rubs. 2+ symmetrical distal pulses are present in all extremities.  Respiratory: Normal respiratory effort. Lungs  are clear to auscultation bilaterally.  Gastrointestinal: Soft, non tender, and non distended with positive bowel sounds. No rebound or guarding. Genitourinary: No CVA tenderness. Musculoskeletal:  No edema, cyanosis, or erythema of extremities. Neurologic: Normal speech and language. Face is symmetric. Moving all extremities. No gross focal neurologic deficits are appreciated. Skin: Skin is warm, dry and intact. No rash noted. Psychiatric: Mood and affect are normal. Speech and behavior are normal.  ED Results / Procedures / Treatments   Labs (all labs ordered are listed, but only abnormal results are displayed) Labs Reviewed  COMPREHENSIVE METABOLIC PANEL - Abnormal; Notable for the following components:      Result Value   Glucose, Bld 130 (*)    BUN 27 (*)    All other components within normal limits  CBC - Abnormal; Notable for the following components:   WBC 14.3 (*)    Hemoglobin 17.1 (*)    All other components within normal limits  URINALYSIS, ROUTINE W REFLEX MICROSCOPIC -  Abnormal; Notable for the following components:   Hgb urine dipstick SMALL (*)    Protein, ur TRACE (*)    All other components within normal limits  LIPASE, BLOOD  TROPONIN I (HIGH SENSITIVITY)  TROPONIN I (HIGH SENSITIVITY)     EKG  ED ECG REPORT I, Rudene Re, the attending physician, personally viewed and interpreted this ECG.  Sinus rhythm with a rate of 69, normal intervals, normal axis, no ST elevations or depressions.   RADIOLOGY I, Rudene Re, attending MD, have personally viewed and interpreted the images obtained during this visit as below:  CT showing no acute pathology.  Patient does have several liver and bilateral kidney cysts   ___________________________________________________ Interpretation by Radiologist:  CT ABDOMEN PELVIS W CONTRAST  Result Date: 04/08/2021 CLINICAL DATA:  Epigastric pain EXAM: CT ABDOMEN AND PELVIS WITH CONTRAST TECHNIQUE: Multidetector CT imaging of the abdomen and pelvis was performed using the standard protocol following bolus administration of intravenous contrast. RADIATION DOSE REDUCTION: This exam was performed according to the departmental dose-optimization program which includes automated exposure control, adjustment of the mA and/or kV according to patient size and/or use of iterative reconstruction technique. CONTRAST:  119mL OMNIPAQUE IOHEXOL 300 MG/ML  SOLN COMPARISON:  None. FINDINGS: Lower chest: Lung bases are clear. No effusions. Heart is normal size. Hepatobiliary: Prior cholecystectomy. Numerous scattered low-density cysts throughout the liver, the largest 3 cm in the anterior right lobe. No suspicious hepatic abnormality. No biliary ductal dilatation. Pancreas: No focal abnormality or ductal dilatation. Spleen: No focal abnormality.  Normal size. Adrenals/Urinary Tract: Bilateral renal cysts. No hydronephrosis. Adrenal glands and urinary bladder unremarkable. Stomach/Bowel: Normal appendix. Stomach, large and small  bowel grossly unremarkable. Vascular/Lymphatic: No evidence of aneurysm or adenopathy. Aortic atherosclerosis Reproductive: No visible focal abnormality. Other: No free fluid or free air. Musculoskeletal: No acute bony abnormality. IMPRESSION: No acute findings. Hepatic and renal cysts. Aortic atherosclerosis. Electronically Signed   By: Rolm Baptise M.D.   On: 04/08/2021 03:50      PROCEDURES:  Critical Care performed: No  Procedures    IMPRESSION / MDM / ASSESSMENT AND PLAN / ED COURSE  I reviewed the triage vital signs and the nursing notes.  71 y.o. male with history as listed below including paroxysmal atrial fibrillation on Xarelto, hypertension, hyperlipidemia, cholecystectomy who presents for evaluation of abdominal pain.  Patient with several hours of intermittent cramping epigastric abdominal pain after eating a large amount of peanuts they were possibly spoiled.  Patient symptoms are  fully resolved.  He is well-appearing and in no distress with normal vital signs, abdomen soft with no distention and no tenderness.  Ddx: Indigestion/GERD versus pancreatitis versus peptic ulcer disease versus food poisoning versus ACS versus esophageal spasms   Plan: CT abdomen pelvis, EKG, troponin, CBC, CMP, lipase, urinalysis   MEDICATIONS GIVEN IN ED: Medications  iohexol (OMNIPAQUE) 300 MG/ML solution 100 mL (100 mLs Intravenous Contrast Given 04/08/21 0337)     ED COURSE: CT with no acute pathology.  I did discuss with the patient the findings of polycystic disease seen on CT and recommended follow-up with PCP for that.  UA with no acute findings.  EKG and troponin with no signs of ACS.  Lipase and LFTs are within normal limits.  Slightly elevated white count most likely stress-induced from pain.  Patient remains pain-free.  Admission was considered but felt unnecessary with full resolve symptoms and negative work-up.  Recommended close follow-up with primary care doctor and discussed my  standard return precautions.   Consults: None   EMR reviewed including last visit with patient's ophthalmologist for migraine headaches in 2022 and PCP for hypertension and hyperlipidemia also in 2020    FINAL CLINICAL IMPRESSION(S) / ED DIAGNOSES   Final diagnoses:  Epigastric abdominal pain     Rx / DC Orders   ED Discharge Orders     None        Note:  This document was prepared using Dragon voice recognition software and may include unintentional dictation errors.   Please note:  Patient was evaluated in Emergency Department today for the symptoms described in the history of present illness. Patient was evaluated in the context of the global COVID-19 pandemic, which necessitated consideration that the patient might be at risk for infection with the SARS-CoV-2 virus that causes COVID-19. Institutional protocols and algorithms that pertain to the evaluation of patients at risk for COVID-19 are in a state of rapid change based on information released by regulatory bodies including the CDC and federal and state organizations. These policies and algorithms were followed during the patient's care in the ED.  Some ED evaluations and interventions may be delayed as a result of limited staffing during the pandemic.       Alfred Levins, [place], MD 04/08/21 [phone]

## 2021-04-18 ENCOUNTER — Encounter: Payer: Self-pay | Admitting: Family Medicine

## 2021-04-18 ENCOUNTER — Ambulatory Visit (INDEPENDENT_AMBULATORY_CARE_PROVIDER_SITE_OTHER): Payer: PPO | Admitting: Family Medicine

## 2021-04-18 ENCOUNTER — Other Ambulatory Visit: Payer: Self-pay

## 2021-04-18 VITALS — BP 132/78 | HR 64 | Temp 98.1°F | Wt 182.2 lb

## 2021-04-18 DIAGNOSIS — Z0000 Encounter for general adult medical examination without abnormal findings: Secondary | ICD-10-CM

## 2021-04-18 DIAGNOSIS — I480 Paroxysmal atrial fibrillation: Secondary | ICD-10-CM

## 2021-04-18 DIAGNOSIS — I10 Essential (primary) hypertension: Secondary | ICD-10-CM | POA: Diagnosis not present

## 2021-04-18 DIAGNOSIS — E782 Mixed hyperlipidemia: Secondary | ICD-10-CM

## 2021-04-18 DIAGNOSIS — N400 Enlarged prostate without lower urinary tract symptoms: Secondary | ICD-10-CM

## 2021-04-18 DIAGNOSIS — Z23 Encounter for immunization: Secondary | ICD-10-CM | POA: Diagnosis not present

## 2021-04-18 LAB — MICROSCOPIC EXAMINATION
Bacteria, UA: NONE SEEN
Epithelial Cells (non renal): NONE SEEN /hpf (ref 0–10)
WBC, UA: NONE SEEN /hpf (ref 0–5)

## 2021-04-18 LAB — URINALYSIS, ROUTINE W REFLEX MICROSCOPIC
Bilirubin, UA: NEGATIVE
Glucose, UA: NEGATIVE
Ketones, UA: NEGATIVE
Leukocytes,UA: NEGATIVE
Nitrite, UA: NEGATIVE
Protein,UA: NEGATIVE
Specific Gravity, UA: 1.02 (ref 1.005–1.030)
Urobilinogen, Ur: 0.2 mg/dL (ref 0.2–1.0)
pH, UA: 5.5 (ref 5.0–7.5)

## 2021-04-18 LAB — MICROALBUMIN, URINE WAIVED
Creatinine, Urine Waived: 200 mg/dL (ref 10–300)
Microalb, Ur Waived: 10 mg/L (ref 0–19)
Microalb/Creat Ratio: <30 mg/g (ref <30)

## 2021-04-18 MED ORDER — RIVAROXABAN 20 MG PO TABS
20.0000 mg | ORAL_TABLET | Freq: Every day | ORAL | 1 refills | Status: DC
Start: 2021-04-18 — End: 2022-03-05

## 2021-04-18 MED ORDER — LISINOPRIL 20 MG PO TABS: 20.0000 mg | ORAL_TABLET | Freq: Every day | ORAL | 1 refills | Status: DC

## 2021-04-18 NOTE — Assessment & Plan Note (Signed)
Under good control on current regimen. Continue current regimen. Continue to monitor. Call with any concerns. Refills given.   

## 2021-04-18 NOTE — Assessment & Plan Note (Signed)
Stopped his atorvastatin due to side effects. Will check labs today. Consider starting crestor. Await results.

## 2021-04-18 NOTE — Assessment & Plan Note (Signed)
NSR today. Follows with cardiology. Checking labs today. Await results. Refills given.

## 2021-04-18 NOTE — Progress Notes (Signed)
BP 132/78 Comment: at home   Pulse 64    Temp 98.1 F (36.7 C)    Wt 182 lb 3.2 oz (82.6 kg)    SpO2 96%    BMI 26.91 kg/m    Subjective:    Patient ID: Neil Bush, male    DOB: January 05, 1951, 71 y.o.   MRN: [id number]  HPI: Neil Bush is a 71 y.o. male presenting on 04/18/2021 for comprehensive medical examination. Current medical complaints include:  HYPERTENSION / HYPERLIPIDEMIA Satisfied with current treatment? yes Duration of hypertension: chronic BP monitoring frequency: a few times a month BP medication side effects: no Duration of hyperlipidemia: chronic Cholesterol medication side effects: yes- has been off for 3 months due to myalgias Cholesterol supplements: none Past cholesterol medications: atorvastatin Medication compliance: good compliance Aspirin: no Recent stressors: no Recurrent headaches: no Visual changes: no Palpitations: no Dyspnea: no Chest pain: no Lower extremity edema: no Dizzy/lightheaded: no   Interim Problems from his last visit: no  Depression Screen done today and results listed below:  Depression screen [facility] 2/9 05/04/2020 04/13/2020 03/31/2019 04/09/2017 02/05/2017  Decreased Interest 0 0 0 0 0  Down, Depressed, Hopeless 0 0 0 0 0  PHQ - 2 Score 0 0 0 0 0  Altered sleeping - - 0 - -  Tired, decreased energy - - 0 - -  Change in appetite - - 0 - -  Feeling bad or failure about yourself  - - 0 - -  Trouble concentrating - - 0 - -  Moving slowly or fidgety/restless - - 0 - -  Suicidal thoughts - - 0 - -  PHQ-9 Score - - 0 - -    Past Medical History:  Past Medical History:  Diagnosis Date   Chest pain    a. 01/2016 in setting of Afib;  b. 02/2016 Ex MV: EF 55-65%, no ischemia.   Dysrhythmia    Headache    Hyperlipidemia    Hypertension    PAF (paroxysmal atrial fibrillation) ([place])    a. 01/2016 - admission for rapid AF and c/p-->converted on IV dilt;  b. CHA2DS2VASc = 2-->Xarelto;  c. 01/2016 Echo: EF 55-60%, triv AI.    Palpitations    a. 08/2016 Zio Patch: symptomatic PAC's and brief (max 11 beats) of SVT/PAT (3 episodes).   Skin cancer     Surgical History:  Past Surgical History:  Procedure Laterality Date   CARDIAC CATHETERIZATION     [facility]    CHOLECYSTECTOMY     COLONOSCOPY WITH PROPOFOL N/A 04/17/2017   Procedure: COLONOSCOPY WITH PROPOFOL;  Surgeon: Lucilla Lame, MD;  Location: [place];  Service: Endoscopy;  Laterality: N/A;   EYE SURGERY     FOOT SURGERY     HERNIA REPAIR     X 2 ingunial   KNEE SURGERY Right    POLYPECTOMY  04/17/2017   Procedure: POLYPECTOMY INTESTINAL;  Surgeon: Lucilla Lame, MD;  Location: [place];  Service: Endoscopy;;    Medications:  Current Outpatient Medications on File Prior to Visit  Medication Sig   flecainide (TAMBOCOR) 100 MG tablet Take 1 tablet (100 mg total) by mouth 2 (two) times daily as needed (daily PRN for atrial fibrillation).   metoprolol succinate (TOPROL-XL) 50 MG 24 hr tablet Take 0.5 tablets (25 mg total) by mouth daily. Take with or immediately following a meal.   atorvastatin (LIPITOR) 40 MG tablet Take 0.5 tablets (20 mg total) by mouth daily at 6 PM. (  Patient not taking: Reported on 04/18/2021)   Current Facility-Administered Medications on File Prior to Visit  Medication   triamcinolone acetonide (KENALOG-40) injection 20 mg    Allergies:  Allergies  Allergen Reactions   Atorvastatin Other (See Comments)    myalgias   Penicillins Rash    Has patient had a PCN reaction causing immediate rash, facial/tongue/throat swelling, SOB or lightheadedness with hypotension: no Has patient had a PCN reaction causing severe rash involving mucus membranes or skin necrosis: no Has patient had a PCN reaction that required hospitalization no Has patient had a PCN reaction occurring within the last 10 years: no If all of the above answers are "NO", then may proceed with Cephalosporin use.    Childhood allergy    Social  History:  Social History   Socioeconomic History   Marital status: Married    Spouse name: Not on file   Number of children: Not on file   Years of education: Not on file   Highest education level: Not on file  Occupational History   Occupation: partime truck driver  Tobacco Use   Smoking status: Never   Smokeless tobacco: Never  Vaping Use   Vaping Use: Never used  Substance and Sexual Activity   Alcohol use: No    Alcohol/week: 0.0 standard drinks   Drug use: No   Sexual activity: Yes  Other Topics Concern   Not on file  Social History Narrative   Not on file   Social Determinants of Health   Financial Resource Strain: Low Risk    Difficulty of Paying Living Expenses: Not hard at all  Food Insecurity: No Food Insecurity   Worried About Charity fundraiser in the Last Year: Never true   [place] in the Last Year: Never true  Transportation Needs: No Transportation Needs   Lack of Transportation (Medical): No   Lack of Transportation (Non-Medical): No  Physical Activity: Inactive   Days of Exercise per Week: 0 days   Minutes of Exercise per Session: 0 min  Stress: No Stress Concern Present   Feeling of Stress : Not at all  Social Connections: Not on file  Intimate Partner Violence: Not on file   Social History   Tobacco Use  Smoking Status Never  Smokeless Tobacco Never   Social History   Substance and Sexual Activity  Alcohol Use No   Alcohol/week: 0.0 standard drinks    Family History:  Family History  Problem Relation Age of Onset   Cancer Mother    Heart disease Father    Diabetes Father    Hypertension Father    Heart disease Brother    COPD Neg Hx    Stroke Neg Hx     Past medical history, surgical history, medications, allergies, family history and social history reviewed with patient today and changes made to appropriate areas of the chart.   Review of Systems  Constitutional: Negative.   HENT: Negative.    Eyes: Negative.    Respiratory: Negative.    Cardiovascular:  Positive for palpitations. Negative for chest pain, orthopnea, claudication, leg swelling and PND.  Gastrointestinal: Negative.   Genitourinary:  Positive for frequency. Negative for dysuria, flank pain, hematuria and urgency.  Musculoskeletal: Negative.   Skin: Negative.   Neurological: Negative.   Endo/Heme/Allergies:  Negative for environmental allergies and polydipsia. Bruises/bleeds easily.  Psychiatric/Behavioral: Negative.    All other ROS negative except what is listed above and in the HPI.  Objective:    BP 132/78 Comment: at home   Pulse 64    Temp 98.1 F (36.7 C)    Wt 182 lb 3.2 oz (82.6 kg)    SpO2 96%    BMI 26.91 kg/m   Wt Readings from Last 3 Encounters:  04/18/21 182 lb 3.2 oz (82.6 kg)  10/11/20 185 lb (83.9 kg)  06/15/20 179 lb 2 oz (81.3 kg)    Physical Exam Vitals and nursing note reviewed.  Constitutional:      General: He is not in acute distress.    Appearance: Normal appearance. He is obese. He is not ill-appearing, toxic-appearing or diaphoretic.  HENT:     Head: Normocephalic and atraumatic.     Right Ear: Tympanic membrane, ear canal and external ear normal. There is no impacted cerumen.     Left Ear: Tympanic membrane, ear canal and external ear normal. There is no impacted cerumen.     Nose: Nose normal. No congestion or rhinorrhea.     Mouth/Throat:     Mouth: Mucous membranes are moist.     Pharynx: Oropharynx is clear. No oropharyngeal exudate or posterior oropharyngeal erythema.  Eyes:     General: No scleral icterus.       Right eye: No discharge.        Left eye: No discharge.     Extraocular Movements: Extraocular movements intact.     Conjunctiva/sclera: Conjunctivae normal.     Pupils: Pupils are equal, round, and reactive to light.  Neck:     Vascular: No carotid bruit.  Cardiovascular:     Rate and Rhythm: Normal rate and regular rhythm.     Pulses: Normal pulses.     Heart  sounds: No murmur heard.   No friction rub. No gallop.  Pulmonary:     Effort: Pulmonary effort is normal. No respiratory distress.     Breath sounds: Normal breath sounds. No stridor. No wheezing, rhonchi or rales.  Chest:     Chest wall: No tenderness.  Abdominal:     General: Abdomen is flat. Bowel sounds are normal. There is no distension.     Palpations: Abdomen is soft. There is no mass.     Tenderness: There is no abdominal tenderness. There is no right CVA tenderness, left CVA tenderness, guarding or rebound.     Hernia: No hernia is present.  Genitourinary:    Comments: Genital exam deferred with shared decision making Musculoskeletal:        General: No swelling, tenderness, deformity or signs of injury.     Cervical back: Normal range of motion and neck supple. No rigidity. No muscular tenderness.     Right lower leg: No edema.     Left lower leg: No edema.  Lymphadenopathy:     Cervical: No cervical adenopathy.  Skin:    General: Skin is warm and dry.     Capillary Refill: Capillary refill takes less than 2 seconds.     Coloration: Skin is not jaundiced or pale.     Findings: No bruising, erythema, lesion or rash.  Neurological:     General: No focal deficit present.     Mental Status: He is alert and oriented to person, place, and time.     Cranial Nerves: No cranial nerve deficit.     Sensory: No sensory deficit.     Motor: No weakness.     Coordination: Coordination normal.     Gait: Gait normal.  Deep Tendon Reflexes: Reflexes normal.  Psychiatric:        Mood and Affect: Mood normal.        Behavior: Behavior normal.        Thought Content: Thought content normal.        Judgment: Judgment normal.    Results for orders placed or performed during the hospital encounter of 04/08/21  Lipase, blood  Result Value Ref Range   Lipase 37 11 - 51 U/L  Comprehensive metabolic panel  Result Value Ref Range   Sodium 137 135 - 145 mmol/L   Potassium 4.0 3.5 -  5.1 mmol/L   Chloride 104 98 - 111 mmol/L   CO2 26 22 - 32 mmol/L   Glucose, Bld 130 (H) 70 - 99 mg/dL   BUN 27 (H) 8 - 23 mg/dL   Creatinine, Ser 1.12 0.61 - 1.24 mg/dL   Calcium 9.7 8.9 - 10.3 mg/dL   Total Protein 7.7 6.5 - 8.1 g/dL   Albumin 4.4 3.5 - 5.0 g/dL   AST 20 15 - 41 U/L   ALT 19 0 - 44 U/L   Alkaline Phosphatase 66 38 - 126 U/L   Total Bilirubin 1.0 0.3 - 1.2 mg/dL   GFR, Estimated >60 >60 mL/min   Anion gap 7 5 - 15  CBC  Result Value Ref Range   WBC 14.3 (H) 4.0 - 10.5 K/uL   RBC 5.13 4.22 - 5.81 MIL/uL   Hemoglobin 17.1 (H) 13.0 - 17.0 g/dL   HCT 48.2 39.0 - 52.0 %   MCV 94.0 80.0 - 100.0 fL   MCH 33.3 26.0 - 34.0 pg   MCHC 35.5 30.0 - 36.0 g/dL   RDW 13.0 11.5 - 15.5 %   Platelets 273 150 - 400 K/uL   nRBC 0.0 0.0 - 0.2 %  Urinalysis, Routine w reflex microscopic  Result Value Ref Range   Color, Urine YELLOW YELLOW   APPearance CLEAR CLEAR   Specific Gravity, Urine 1.025 1.005 - 1.030   pH 5.5 5.0 - 8.0   Glucose, UA NEGATIVE NEGATIVE mg/dL   Hgb urine dipstick SMALL (A) NEGATIVE   Bilirubin Urine NEGATIVE NEGATIVE   Ketones, ur NEGATIVE NEGATIVE mg/dL   Protein, ur TRACE (A) NEGATIVE mg/dL   Nitrite NEGATIVE NEGATIVE   Leukocytes,Ua NEGATIVE NEGATIVE   RBC / HPF 0-5 0 - 5 RBC/hpf   WBC, UA 0-5 0 - 5 WBC/hpf   Bacteria, UA NONE SEEN NONE SEEN   Squamous Epithelial / LPF NONE SEEN 0 - 5   Mucus PRESENT   Troponin I (High Sensitivity)  Result Value Ref Range   Troponin I (High Sensitivity) 7 <18 ng/L  Troponin I (High Sensitivity)  Result Value Ref Range   Troponin I (High Sensitivity) 6 <18 ng/L      Assessment & Plan:   Problem List Items Addressed This Visit       Cardiovascular and Mediastinum   Essential hypertension    Under good control on current regimen. Continue current regimen. Continue to monitor. Call with any concerns. Refills given.        Relevant Medications   rivaroxaban (XARELTO) 20 MG TABS tablet   lisinopril  (ZESTRIL) 20 MG tablet   Other Relevant Orders   Comprehensive metabolic panel   TSH   Urinalysis, Routine w reflex microscopic   Microalbumin, Urine Waived   Paroxysmal atrial fibrillation (H[facility])    NSR today. Follows with cardiology. Checking labs today. Await results. Refills  given.       Relevant Medications   rivaroxaban (XARELTO) 20 MG TABS tablet   lisinopril (ZESTRIL) 20 MG tablet   Other Relevant Orders   Comprehensive metabolic panel   CBC with Differential/Platelet     Genitourinary   BPH (benign prostatic hyperplasia)    Under good control on current regimen. Continue current regimen. Continue to monitor. Call with any concerns. Refills given.          Other   Hyperlipidemia    Stopped his atorvastatin due to side effects. Will check labs today. Consider starting crestor. Await results.       Relevant Medications   rivaroxaban (XARELTO) 20 MG TABS tablet   lisinopril (ZESTRIL) 20 MG tablet   Other Relevant Orders   Comprehensive metabolic panel   Lipid Panel w/o Chol/HDL Ratio   Other Visit Diagnoses     Routine general medical examination at a health care facility    -  Primary   Vaccines up to date. Screening labs checked today. Colonoscopy up to date. Continue diet and exercise. Call with any concerns.    Need for influenza vaccination       Relevant Orders   Flu Vaccine QUAD High Dose(Fluad) (Completed)        LABORATORY TESTING:  Health maintenance labs ordered today as discussed above.   IMMUNIZATIONS:   - Tdap: Tetanus vaccination status reviewed: last tetanus booster within 10 years. - Influenza: Administered today - Pneumovax: Up to date - Prevnar: Up to date - COVID: Up to date - HPV: Not applicable - Shingrix vaccine: Refused  SCREENING: - Colonoscopy: Up to date  Discussed with patient purpose of the colonoscopy is to detect colon cancer at curable precancerous or early stages   PATIENT COUNSELING:    Sexuality: Discussed  sexually transmitted diseases, partner selection, use of condoms, avoidance of unintended pregnancy  and contraceptive alternatives.   Advised to avoid cigarette smoking.  I discussed with the patient that most people either abstain from alcohol or drink within safe limits (<=14/week and <=4 drinks/occasion for males, <=7/weeks and <= 3 drinks/occasion for females) and that the risk for alcohol disorders and other health effects rises proportionally with the number of drinks per week and how often a drinker exceeds daily limits.  Discussed cessation/primary prevention of drug use and availability of treatment for abuse.   Diet: Encouraged to adjust caloric intake to maintain  or achieve ideal body weight, to reduce intake of dietary saturated fat and total fat, to limit sodium intake by avoiding high sodium foods and not adding table salt, and to maintain adequate dietary potassium and calcium preferably from fresh fruits, vegetables, and low-fat dairy products.    stressed the importance of regular exercise  Injury prevention: Discussed safety belts, safety helmets, smoke detector, smoking near bedding or upholstery.   Dental health: Discussed importance of regular tooth brushing, flossing, and dental visits.   Follow up plan: NEXT PREVENTATIVE PHYSICAL DUE IN 1 YEAR. Return in about 6 months (around 10/16/2021).

## 2021-04-19 ENCOUNTER — Encounter: Payer: Self-pay | Admitting: Family Medicine

## 2021-04-19 LAB — COMPREHENSIVE METABOLIC PANEL
ALT: 16 IU/L (ref 0–44)
AST: 16 IU/L (ref 0–40)
Albumin/Globulin Ratio: 1.8 (ref 1.2–2.2)
Albumin: 4.5 g/dL (ref 3.7–4.7)
Alkaline Phosphatase: 83 IU/L (ref 44–121)
BUN/Creatinine Ratio: 17 (ref 10–24)
BUN: 23 mg/dL (ref 8–27)
Bilirubin Total: 0.5 mg/dL (ref 0.0–1.2)
CO2: 25 mmol/L (ref 20–29)
Calcium: 9.2 mg/dL (ref 8.6–10.2)
Chloride: 103 mmol/L (ref 96–106)
Creatinine, Ser: 1.35 mg/dL — ABNORMAL HIGH (ref 0.76–1.27)
Globulin, Total: 2.5 g/dL (ref 1.5–4.5)
Glucose: 100 mg/dL — ABNORMAL HIGH (ref 70–99)
Potassium: 4.5 mmol/L (ref 3.5–5.2)
Sodium: 140 mmol/L (ref 134–144)
Total Protein: 7 g/dL (ref 6.0–8.5)
eGFR: 56 mL/min/{1.73_m2} — ABNORMAL LOW (ref >59)

## 2021-04-19 LAB — CBC WITH DIFFERENTIAL/PLATELET
Basophils Absolute: 0.1 10*3/uL (ref 0.0–0.2)
Basos: 1 %
EOS (ABSOLUTE): 0.2 10*3/uL (ref 0.0–0.4)
Eos: 2 %
Hematocrit: 49 % (ref 37.5–51.0)
Hemoglobin: 17 g/dL (ref 13.0–17.7)
Immature Grans (Abs): 0 10*3/uL (ref 0.0–0.1)
Immature Granulocytes: 0 %
Lymphocytes Absolute: 1.7 10*3/uL (ref 0.7–3.1)
Lymphs: 25 %
MCH: 32.7 pg (ref 26.6–33.0)
MCHC: 34.7 g/dL (ref 31.5–35.7)
MCV: 94 fL (ref 79–97)
Monocytes Absolute: 0.5 10*3/uL (ref 0.1–0.9)
Monocytes: 7 %
Neutrophils Absolute: 4.4 10*3/uL (ref 1.4–7.0)
Neutrophils: 65 %
Platelets: 275 10*3/uL (ref 150–450)
RBC: 5.2 x10E6/uL (ref 4.14–5.80)
RDW: 12.9 % (ref 11.6–15.4)
WBC: 6.9 10*3/uL (ref 3.4–10.8)

## 2021-04-19 LAB — LIPID PANEL W/O CHOL/HDL RATIO
Cholesterol, Total: 219 mg/dL — ABNORMAL HIGH (ref 100–199)
HDL: 41 mg/dL (ref >39)
LDL Chol Calc (NIH): 146 mg/dL — ABNORMAL HIGH (ref 0–99)
Triglycerides: 177 mg/dL — ABNORMAL HIGH (ref 0–149)
VLDL Cholesterol Cal: 32 mg/dL (ref 5–40)

## 2021-04-19 LAB — TSH: TSH: 1.57 u[IU]/mL (ref 0.450–4.500)

## 2021-05-01 ENCOUNTER — Telehealth: Payer: Self-pay | Admitting: Family Medicine

## 2021-05-01 NOTE — Telephone Encounter (Signed)
Copied from [place] [phone]. Topic: General - Other ?>> May 01, 2021  1:12 PM McGill, Nelva Bush wrote: ?Reason for CRM: Pt would like to cancel upcoming AWV. ?Pt declined to reschedule. ?

## 2021-05-06 ENCOUNTER — Ambulatory Visit: Payer: PPO

## 2021-05-30 ENCOUNTER — Ambulatory Visit (INDEPENDENT_AMBULATORY_CARE_PROVIDER_SITE_OTHER): Payer: PPO | Admitting: *Deleted

## 2021-05-30 DIAGNOSIS — Z0000 Encounter for general adult medical examination without abnormal findings: Secondary | ICD-10-CM | POA: Diagnosis not present

## 2021-05-30 NOTE — Progress Notes (Signed)
? ?Subjective:  ? Neil Bush is a 71 y.o. male who presents for Medicare Annual/Subsequent preventive examination. ? ?I connected with  [name] on 05/30/21 by a telephone enabled telemedicine application and verified that I am speaking with the correct person using two identifiers. ?  ?I discussed the limitations of evaluation and management by telemedicine. The patient expressed understanding and agreed to proceed. ? ?Patient location: home ? ?Provider location: tele-Health not in office ? ?  ? ?Review of Systems    ? ?Cardiac Risk Factors include: advanced age (>63mn, >>58women);male gender;hypertension;family history of premature cardiovascular disease ? ?   ?Objective:  ?  ?Today's Vitals  ? ?There is no height or weight on file to calculate BMI. ? ? ?  05/30/2021  ?  9:04 AM 05/04/2020  ?  9:01 AM 12/31/2018  ? 10:48 AM 09/29/2018  ? 11:49 AM 04/17/2017  ?  7:09 AM 01/26/2016  ?  6:00 AM 01/26/2016  ?  2:05 AM  ?Advanced Directives  ?Does Patient Have a Medical Advance Directive? Yes Yes Yes Yes No No No  ?Type of AParamed[place]of A[place] LakeLiving will HNew [place] CenterLiving will HMcLendon-Chishol[place]iving will     ?Does patient want to make changes to medical advance directive?   No - Patient declined No - Patient declined     ?Copy of [place]? No - copy requested No - copy requested No - copy requested No - copy requested     ?Would patient like information on creating a medical advance directive? No - Patient declined  No - Patient declined No - Patient declined No - Patient declined No - Patient declined   ? ? ?Current Medications (verified) ?Outpatient Encounter Medications as of 05/30/2021  ?Medication Sig  ? flecainide (TAMBOCOR) 100 MG tablet Take 1 tablet (100 mg total) by mouth 2 (two) times daily as needed (daily PRN for atrial fibrillation).  ? lisinopril (ZESTRIL) 20 MG tablet Take 1 tablet  (20 mg total) by mouth daily.  ? metoprolol succinate (TOPROL-XL) 50 MG 24 hr tablet Take 0.5 tablets (25 mg total) by mouth daily. Take with or immediately following a meal.  ? rivaroxaban (XARELTO) 20 MG TABS tablet Take 1 tablet (20 mg total) by mouth daily.  ? atorvastatin (LIPITOR) 40 MG tablet Take 0.5 tablets (20 mg total) by mouth daily at 6 PM. (Patient not taking: Reported on 05/30/2021)  ? ?Facility-Administered Encounter Medications as of 05/30/2021  ?Medication  ? triamcinolone acetonide (KENALOG-40) injection 20 mg  ? ? ?Allergies (verified) ?Atorvastatin and Penicillins  ? ?History: ?Past Medical History:  ?Diagnosis Date  ? Chest pain   ? a. 01/2016 in setting of Afib;  b. 02/2016 Ex MV: EF 55-65%, no ischemia.  ? Dysrhythmia   ? Headache   ? Hyperlipidemia   ? Hypertension   ? [place]   ? a. 01/2016 - admission for rapid AF and c/p-->converted on IV dilt;  b. CHA2DS2VASc = 2-->Xarelto;  c. 01/2016 Echo: EF 55-60%, triv AI.  ? Palpitations   ? a. 08/2016 Zio Patch: symptomatic PAC's and brief (max 11 beats) of SVT/PAT (3 episodes).  ? Skin cancer   ? ?Past Surgical History:  ?Procedure Laterality Date  ? CARDIAC CATHETERIZATION    ? [facility]   ? CHOLECYSTECTOMY    ? COLONOSCOPY WITH PROPOFOL N/A 04/17/2017  ? Procedure: COLONOSCOPY WITH PROPOFOL;  Surgeon: Lucilla Lame, MD;  Location: [place];  Service: Endoscopy;  Laterality: N/A;  ? EYE SURGERY    ? FOOT SURGERY    ? HERNIA REPAIR    ? X 2 ingunial  ? KNEE SURGERY Right   ? POLYPECTOMY  04/17/2017  ? Procedure: POLYPECTOMY INTESTINAL;  Surgeon: Lucilla Lame, MD;  Location: [place];  Service: Endoscopy;;  ? ?Family History  ?Problem Relation Age of Onset  ? Cancer Mother   ? Heart disease Father   ? Diabetes Father   ? Hypertension Father   ? Heart disease Brother   ? COPD Neg Hx   ? Stroke Neg Hx   ? ?Social History  ? ?Socioeconomic History  ? Marital status: Married  ?  Spouse name: Not on file  ?  Number of children: Not on file  ? Years of education: Not on file  ? Highest education level: Not on file  ?Occupational History  ? Occupation: partime truck Geophysicist/field seismologist  ?Tobacco Use  ? Smoking status: Never  ? Smokeless tobacco: Never  ?Vaping Use  ? Vaping Use: Never used  ?Substance and Sexual Activity  ? Alcohol use: No  ?  Alcohol/week: 0.0 standard drinks  ? Drug use: No  ? Sexual activity: Yes  ?Other Topics Concern  ? Not on file  ?Social History Narrative  ? Not on file  ? ?Social Determinants of Health  ? ?Financial Resource Strain: Low Risk   ? Difficulty of Paying Living Expenses: Not hard at all  ?Food Insecurity: No Food Insecurity  ? Worried About Charity fundraiser in the Last Year: Never true  ? Ran Out of Food in the Last Year: Never true  ?Transportation Needs: No Transportation Needs  ? Lack of Transportation (Medical): No  ? Lack of Transportation (Non-Medical): No  ?Physical Activity: Insufficiently Active  ? Days of Exercise per Week: 3 days  ? Minutes of Exercise per Session: 40 min  ?Stress: No Stress Concern Present  ? Feeling of Stress : Not at all  ?Social Connections: Moderately Integrated  ? Frequency of Communication with Friends and Family: Three times a week  ? Frequency of Social Gatherings with Friends and Family: Once a week  ? Attends Religious Services: More than 4 times per year  ? Active Member of Clubs or Organizations: No  ? Attends Archivist Meetings: Never  ? Marital Status: Married  ? ? ?Tobacco Counseling ?Counseling given: Not Answered ? ? ?Clinical Intake: ? ?Pre-visit preparation completed: Yes ? ?Pain : No/denies pain ? ?  ? ?Nutritional Risks: None ?Diabetes: No ? ?How often do you need to have someone help you when you read instructions, pamphlets, or other written materials from your doctor or pharmacy?: 1 - Never ? ?Diabetic?no ? ?Interpreter Needed?: No ? ?Information entered by :: Leroy Kennedy LPN ? ? ?Activities of Daily Living ? ?  05/30/2021  ?  9:05  AM 04/18/2021  ?  8:36 AM  ?In your present state of health, do you have any difficulty performing the following activities:  ?Hearing? 0 0  ?Vision? 0 0  ?Difficulty concentrating or making decisions? 0 0  ?Walking or climbing stairs? 0 0  ?Dressing or bathing? 0 0  ?Doing errands, shopping? 0 0  ?Preparing Food and eating ? N   ?Using the Toilet? N   ?In the past six months, have you accidently leaked urine? N   ?Do you have problems with loss of bowel  control? N   ?Managing your Medications? N   ?Managing your Finances? N   ?Housekeeping or managing your Housekeeping? N   ? ? ?Patient Care Team: ?[name], DO as PCP - General (Family Medicine) ?Minna Merritts, MD as PCP - Cardiology (Cardiology) ?Carloyn Manner, MD as Referring Physician (Otolaryngology) ?Hyatt, Max T, DPM as Veterinary surgeon) ?Birder Robson, MD as Referring Physician (Ophthalmology) ?Robert Bellow, MD (General Surgery) ?Lollie Sails, MD (Inactive) as Consulting Physician (Gastroenterology) ? ?Indicate any recent Medical Services you may have received from other than [facility] providers in the past year (date may be approximate). ? ?   ?Assessment:  ? This is a routine wellness examination for Mindy. ? ?Hearing/Vision screen ?Hearing Screening - Comments:: No trouble hearing ?Vision Screening - Comments:: Up to date  ?Encinal eye ? ?Dietary issues and exercise activities discussed: ?Current Exercise Habits: Home exercise routine, Type of exercise: walking (works in the yard), Time (Minutes): 40, Frequency (Times/Week): 3, Weekly Exercise (Minutes/Week): 120, Intensity: Mild ? ? Goals Addressed   ? ?  ?  ?  ?  ? This Visit's Progress  ?  Patient Stated     ?  Stay healthy ?  ? ?  ? ?Depression Screen ? ?  05/30/2021  ?  9:01 AM 05/04/2020  ?  9:03 AM 04/13/2020  ?  8:05 AM 03/31/2019  ?  3:05 PM 04/09/2017  ?  8:27 AM 02/05/2017  ?  8:07 AM 09/01/2016  ? 10:00 AM  ?PHQ 2/9 Scores  ?PHQ - 2 Score 0 0 0 0 0 0 0  ?PHQ- 9  Score    0     ?  ?Fall Risk ? ?  04/18/2021  ?  8:36 AM 05/04/2020  ?  9:03 AM 04/13/2020  ?  8:05 AM 03/31/2019  ?  3:16 PM 03/29/2018  ?  1:59 PM  ?Fall Risk   ?Falls in the past year? 0 0 0 0 0  ?Number

## 2021-05-30 NOTE — Patient Instructions (Signed)
Mr. Gallier , ?Thank you for taking time to come for your Medicare Wellness Visit. I appreciate your ongoing commitment to your health goals. Please review the following plan we discussed and let me know if I can assist you in the future.  ? ?Screening recommendations/referrals: ?Colonoscopy: up to date ?Recommended yearly ophthalmology/optometry visit for glaucoma screening and checkup ?Recommended yearly dental visit for hygiene and checkup ? ?Vaccinations: ?Influenza vaccine: up to date ?Pneumococcal vaccine: up to date ?Tdap vaccine: up to date ?Shingles vaccine: Education provided   ? ?Advanced directives: Education provided ? ?Conditions/risks identified:  ? ?Next appointment: 10-17-2021 8:20  Wynetta Emery ? ?Preventive Care 40 Years and Older, Male ?Preventive care refers to lifestyle choices and visits with your health care provider that can promote health and wellness. ?What does preventive care include? ?A yearly physical exam. This is also called an annual well check. ?Dental exams once or twice a year. ?Routine eye exams. Ask your health care provider how often you should have your eyes checked. ?Personal lifestyle choices, including: ?Daily care of your teeth and gums. ?Regular physical activity. ?Eating a healthy diet. ?Avoiding tobacco and drug use. ?Limiting alcohol use. ?Practicing safe sex. ?Taking low doses of aspirin every day. ?Taking vitamin and mineral supplements as recommended by your health care provider. ?What happens during an annual well check? ?The services and screenings done by your health care provider during your annual well check will depend on your age, overall health, lifestyle risk factors, and family history of disease. ?Counseling  ?Your health care provider may ask you questions about your: ?Alcohol use. ?Tobacco use. ?Drug use. ?Emotional well-being. ?Home and relationship well-being. ?Sexual activity. ?Eating habits. ?History of falls. ?Memory and ability to understand  (cognition). ?Work and work Statistician. ?Screening  ?You may have the following tests or measurements: ?Height, weight, and BMI. ?Blood pressure. ?Lipid and cholesterol levels. These may be checked every 5 years, or more frequently if you are over 60 years old. ?Skin check. ?Lung cancer screening. You may have this screening every year starting at age 16 if you have a 30-pack-year history of smoking and currently smoke or have quit within the past 15 years. ?Fecal occult blood test (FOBT) of the stool. You may have this test every year starting at age 22. ?Flexible sigmoidoscopy or colonoscopy. You may have a sigmoidoscopy every 5 years or a colonoscopy every 10 years starting at age 80. ?Prostate cancer screening. Recommendations will vary depending on your family history and other risks. ?Hepatitis C blood test. ?Hepatitis B blood test. ?Sexually transmitted disease (STD) testing. ?Diabetes screening. This is done by checking your blood sugar (glucose) after you have not eaten for a while (fasting). You may have this done every 1-3 years. ?Abdominal aortic aneurysm (AAA) screening. You may need this if you are a current or former smoker. ?Osteoporosis. You may be screened starting at age 33 if you are at high risk. ?Talk with your health care provider about your test results, treatment options, and if necessary, the need for more tests. ?Vaccines  ?Your health care provider may recommend certain vaccines, such as: ?Influenza vaccine. This is recommended every year. ?Tetanus, diphtheria, and acellular pertussis (Tdap, Td) vaccine. You may need a Td booster every 10 years. ?Zoster vaccine. You may need this after age 37. ?Pneumococcal 13-valent conjugate (PCV13) vaccine. One dose is recommended after age 57. ?Pneumococcal polysaccharide (PPSV23) vaccine. One dose is recommended after age 52. ?Talk to your health care provider about which screenings and vaccines  you need and how often you need them. ?This  information is not intended to replace advice given to you by your health care provider. Make sure you discuss any questions you have with your health care provider. ?Document Released: 03/16/2015 Document Revised: 11/07/2015 Document Reviewed: 12/19/2014 ?Elsevier Interactive Patient Education ? 2017 [place]. ? ?Fall Prevention in the Home ?Falls can cause injuries. They can happen to people of all ages. There are many things you can do to make your home safe and to help prevent falls. ?What can I do on the outside of my home? ?Regularly fix the edges of walkways and driveways and fix any cracks. ?Remove anything that might make you trip as you walk through a door, such as a raised step or threshold. ?Trim any bushes or trees on the path to your home. ?Use bright outdoor lighting. ?Clear any walking paths of anything that might make someone trip, such as rocks or tools. ?Regularly check to see if handrails are loose or broken. Make sure that both sides of any steps have handrails. ?Any raised decks and porches should have guardrails on the edges. ?Have any leaves, snow, or ice cleared regularly. ?Use sand or salt on walking paths during winter. ?Clean up any spills in your garage right away. This includes oil or grease spills. ?What can I do in the bathroom? ?Use night lights. ?Install grab bars by the toilet and in the tub and shower. Do not use towel bars as grab bars. ?Use non-skid mats or decals in the tub or shower. ?If you need to sit down in the shower, use a plastic, non-slip stool. ?Keep the floor dry. Clean up any water that spills on the floor as soon as it happens. ?Remove soap buildup in the tub or shower regularly. ?Attach bath mats securely with double-sided non-slip rug tape. ?Do not have throw rugs and other things on the floor that can make you trip. ?What can I do in the bedroom? ?Use night lights. ?Make sure that you have a light by your bed that is easy to reach. ?Do not use any sheets or  blankets that are too big for your bed. They should not hang down onto the floor. ?Have a firm chair that has side arms. You can use this for support while you get dressed. ?Do not have throw rugs and other things on the floor that can make you trip. ?What can I do in the kitchen? ?Clean up any spills right away. ?Avoid walking on wet floors. ?Keep items that you use a lot in easy-to-reach places. ?If you need to reach something above you, use a strong step stool that has a grab bar. ?Keep electrical cords out of the way. ?Do not use floor polish or wax that makes floors slippery. If you must use wax, use non-skid floor wax. ?Do not have throw rugs and other things on the floor that can make you trip. ?What can I do with my stairs? ?Do not leave any items on the stairs. ?Make sure that there are handrails on both sides of the stairs and use them. Fix handrails that are broken or loose. Make sure that handrails are as long as the stairways. ?Check any carpeting to make sure that it is firmly attached to the stairs. Fix any carpet that is loose or worn. ?Avoid having throw rugs at the top or bottom of the stairs. If you do have throw rugs, attach them to the floor with carpet tape. ?Make sure  that you have a light switch at the top of the stairs and the bottom of the stairs. If you do not have them, ask someone to add them for you. ?What else can I do to help prevent falls? ?Wear shoes that: ?Do not have high heels. ?Have rubber bottoms. ?Are comfortable and fit you well. ?Are closed at the toe. Do not wear sandals. ?If you use a stepladder: ?Make sure that it is fully opened. Do not climb a closed stepladder. ?Make sure that both sides of the stepladder are locked into place. ?Ask someone to hold it for you, if possible. ?Clearly mark and make sure that you can see: ?Any grab bars or handrails. ?First and last steps. ?Where the edge of each step is. ?Use tools that help you move around (mobility aids) if they are  needed. These include: ?Canes. ?Walkers. ?Scooters. ?Crutches. ?Turn on the lights when you go into a dark area. Replace any light bulbs as soon as they burn out. ?Set up your furniture so you have a clear path. Avoid moving

## 2021-07-03 NOTE — Progress Notes (Signed)
Cardiology Office Note ? ?Date:  07/05/2021  ? ?ID:  Neil Bush, DOB 08-31-1950, MRN [id number] ? ?PCP:  [name], DO  ? ?Chief Complaint  ?Patient presents with  ? 12 month follow up   ?  "Doing well." Medications reviewed by the patient verbally.   ? ? ?HPI:  ?71 year old male with a prior history of  ?paroxysmal atrial fibrillation, 01/26/2016, 2020 ?CHA2DS2V 6 ASc score of 2 (HTN, agex1).  ?hypertension,  ?hyperlipidemia,  ?chest pain , Stress test September 30, 2018 ?No ischemia low risk study ?CT calcium score 0 ?who presents for follow-up of his paroxysmal atrial fibrillation ? ?Last seen in the office by myself on April 2022 ? ?In the emergency room February 2023 with abdominal pain ? ?On today's discussion denies significant tachypalpitations concerning for paroxysmal atrial fibrillation ?Thinks he is only had to take flecainide on 2 occasions since prescription was first started ?Continues on metoprolol succinate 25 daily ? ?Had some muscle/joint issues on Lipitor, held the medication ?Total cholesterol jumping from 160 up to 220 ? ?Active on the farm, planting ? ?CT coronary calcium score of 0 November 2021 ? ?Tolerating Xarelto, concerned about the cost and donut hole ? ?Denies chest pain or shortness of breath concerning for angina ? ?EKG personally reviewed by myself on todays visit ?Shows normal sinus rhythm rate 67 bpm no significant ST or T wave changes ? ?Her past medical history reviewed ?Seen in the hospital end of July 2020 ?emergency room September 29, 2018 for chest pain, atrial fibrillation ?Reports that he was sitting in his truck getting ready to go to work when he developed tightness in his chest.  Felt his pulse, appreciated it was rapid and irregular ?Symptoms started approximately 6:30 AM and persisted ?Noted to be in atrial fibrillation with RVR rate 140 bpm ?He presented to the emergency room, converted to normal sinus rhythm around 11:30 AM ?tnt 200 ?Converted 2 normal sinus rhythm  after 5 hours in the emergency room ?  ?EKG from the  emergency room showing confirming atrial fibrillation ventricular rate 140 bpm ?Stress test September 30, 2018 ?No ischemia low risk study ? ?November 2017 he was admitted with chest pain and A. fib and converted to sinus rhythm on IV diltiazem.  ?Started on Xarelto in the setting of a CHA2DS2VASc of 2.  ? ?stress testing which did not show ischemia.  December 2017 ?Echo showed normal LV function.  ? ? ?PMH:   has a past medical history of Chest pain, Dysrhythmia, Headache, Hyperlipidemia, Hypertension, PAF (paroxysmal atrial fibrillation) ([place]), Palpitations, and Skin cancer. ? ?PSH:    ?Past Surgical History:  ?Procedure Laterality Date  ? CARDIAC CATHETERIZATION    ? [facility]   ? CHOLECYSTECTOMY    ? COLONOSCOPY WITH PROPOFOL N/A 04/17/2017  ? Procedure: COLONOSCOPY WITH PROPOFOL;  Surgeon: Lucilla Lame, MD;  Location: [place];  Service: Endoscopy;  Laterality: N/A;  ? EYE SURGERY    ? FOOT SURGERY    ? HERNIA REPAIR    ? X 2 ingunial  ? KNEE SURGERY Right   ? POLYPECTOMY  04/17/2017  ? Procedure: POLYPECTOMY INTESTINAL;  Surgeon: Lucilla Lame, MD;  Location: [place];  Service: Endoscopy;;  ? ? ?Current Outpatient Medications  ?Medication Sig Dispense Refill  ? flecainide (TAMBOCOR) 100 MG tablet Take 1 tablet (100 mg total) by mouth 2 (two) times daily as needed (daily PRN for atrial fibrillation). 90 tablet 3  ? lisinopril (ZESTRIL) 20 MG tablet  Take 1 tablet (20 mg total) by mouth daily. 90 tablet 1  ? metoprolol succinate (TOPROL-XL) 50 MG 24 hr tablet Take 0.5 tablets (25 mg total) by mouth daily. Take with or immediately following a meal. 45 tablet 3  ? rivaroxaban (XARELTO) 20 MG TABS tablet Take 1 tablet (20 mg total) by mouth daily. 90 tablet 1  ? atorvastatin (LIPITOR) 40 MG tablet Take 0.5 tablets (20 mg total) by mouth daily at 6 PM. (Patient not taking: Reported on 05/30/2021) 45 tablet 3  ? ?Current Facility-Administered Medications   ?Medication Dose Route Frequency Provider Last Rate Last Admin  ? triamcinolone acetonide (KENALOG-40) injection 20 mg  20 mg Other Once [place], Max T, DPM      ? ? ?Allergies:   Atorvastatin and Penicillins  ? ?Social History:  The patient  reports that he has never smoked. He has never used smokeless tobacco. He reports that he does not drink alcohol and does not use drugs.  ? ?Family History:   family history includes Cancer in his mother; Diabetes in his father; Heart disease in his brother and father; Hypertension in his father.  ? ? ?Review of Systems: ?Review of Systems  ?Constitutional: Negative.   ?HENT: Negative.    ?Respiratory: Negative.    ?Cardiovascular: Negative.   ?Gastrointestinal: Negative.   ?Musculoskeletal: Negative.   ?Neurological: Negative.   ?Psychiatric/Behavioral: Negative.    ?All other systems reviewed and are negative. ? ?PHYSICAL EXAM: ?VS:  BP 120/80 (BP Location: Left Arm, Patient Position: Sitting, Cuff Size: Normal)   Pulse 67   Ht '5\' 10"'$  (1.778 m)   Wt 180 lb 4 oz (81.8 kg)   SpO2 98%   BMI 25.86 kg/m?  , BMI Body mass index is 25.86 kg/m?Marland Kitchen ?Constitutional:  oriented to person, place, and time. No distress.  ?HENT:  ?Head: Grossly normal ?Eyes:  no discharge. No scleral icterus.  ?Neck: No JVD, no carotid bruits  ?Cardiovascular: Regular rate and rhythm, no murmurs appreciated ?Pulmonary/Chest: Clear to auscultation bilaterally, no wheezes or rails ?Abdominal: Soft.  no distension.  no tenderness.  ?Musculoskeletal: Normal range of motion ?Neurological:  normal muscle tone. Coordination normal. No atrophy ?Skin: Skin warm and dry ?Psychiatric: normal affect, pleasant ? ?Recent Labs: ?04/18/2021: ALT 16; BUN 23; Creatinine, Ser 1.35; Hemoglobin 17.0; Platelets 275; Potassium 4.5; Sodium 140; TSH 1.570  ? ? ?Lipid Panel ?Lab Results  ?Component Value Date  ? CHOL 219 (H) 04/18/2021  ? HDL 41 04/18/2021  ? LDLCALC 146 (H) 04/18/2021  ? TRIG 177 (H) 04/18/2021  ? ?  ? ?Wt  Readings from Last 3 Encounters:  ?07/05/21 180 lb 4 oz (81.8 kg)  ?04/18/21 182 lb 3.2 oz (82.6 kg)  ?10/11/20 185 lb (83.9 kg)  ?  ? ? ?ASSESSMENT AND PLAN: ? ?Paroxysmal atrial fibrillation (HCC) - ?Continue Xarelto 20 mg daily,  metoprolol succinate 25 mg daily ?For breakthrough atrial fibrillation recommended he take flecainide with extra dose of metoprolol ?Discussed options for anticoagulation including watching for when Pradaxa goes generic ? ?Essential hypertension ?Blood pressure is well controlled on today's visit. No changes made to the medications. ? ?Mixed hyperlipidemia ?Significant jump in total cholesterol off Lipitor ?Reports having statin myalgias ?Recommend he try Lipitor 20 every other day and start Zetia 10 daily ?Coronary calcium score 0 ? ?Chest pain, unspecified type ?No recent chest pain symptoms ?Prior work-up with calcium score of 0 ? ?DOT clearance ?Clearance can be given if needed ? ? Total encounter time  more than 25 minutes ? Greater than 50% was spent in counseling and coordination of care with the patient ? ? ? ?No orders of the defined types were placed in this encounter. ? ? ? ?Signed, ?Esmond Plants, M.D., Ph.D. ?07/05/2021  ?[place] ?[phone] ? ?

## 2021-07-05 ENCOUNTER — Encounter: Payer: Self-pay | Admitting: Cardiovascular Disease

## 2021-07-05 ENCOUNTER — Ambulatory Visit: Payer: PPO | Admitting: Cardiovascular Disease

## 2021-07-05 ENCOUNTER — Telehealth: Payer: Self-pay | Admitting: Emergency Medicine

## 2021-07-05 VITALS — BP 120/80 | HR 67 | Ht 70.0 in | Wt 180.2 lb

## 2021-07-05 DIAGNOSIS — E785 Hyperlipidemia, unspecified: Secondary | ICD-10-CM

## 2021-07-05 DIAGNOSIS — R079 Chest pain, unspecified: Secondary | ICD-10-CM | POA: Diagnosis not present

## 2021-07-05 DIAGNOSIS — I351 Nonrheumatic aortic (valve) insufficiency: Secondary | ICD-10-CM | POA: Diagnosis not present

## 2021-07-05 DIAGNOSIS — R0600 Dyspnea, unspecified: Secondary | ICD-10-CM

## 2021-07-05 DIAGNOSIS — I480 Paroxysmal atrial fibrillation: Secondary | ICD-10-CM

## 2021-07-05 DIAGNOSIS — I10 Essential (primary) hypertension: Secondary | ICD-10-CM | POA: Diagnosis not present

## 2021-07-05 MED ORDER — EZETIMIBE 10 MG PO TABS: 10.0000 mg | ORAL_TABLET | Freq: Every day | ORAL | 3 refills | Status: DC

## 2021-07-05 NOTE — Patient Instructions (Signed)
? ?  Medication Instructions:  ?zetia 10 mg ?Continue the lipitor 20 mg every other day ? ?Ask pharmacy about pradaxa  ? ?If you need a refill on your cardiac medications before your next appointment, please call your pharmacy.  ? ?Lab work: ?No new labs needed ? ?Testing/Procedures: ?No new testing needed ? ?Follow-Up: ?At [facility], you and your health needs are our priority.  As part of our continuing mission to provide you with exceptional heart care, we have created designated Provider Care Teams.  These Care Teams include your primary Cardiologist (physician) and Advanced Practice Providers (APPs -  Physician Assistants and Nurse Practitioners) who all work together to provide you with the care you need, when you need it. ? ?You will need a follow up appointment in 12 months ? ?Providers on your designated Care Team:   ?Murray Hodgkins, NP ?Christell Faith, PA-C ?Cadence Kathlen Mody, PA-C ? ?COVID-19 Vaccine Information can be found at: [email or web address] For questions related to vaccine distribution or appointments, please email [email or web address] or call [phone].  ? ?

## 2021-07-05 NOTE — Telephone Encounter (Signed)
Xarelto samples ? ?Medication Samples have been provided to the patient. ? ?Drug name: xarelto       Strength: 20        Qty: 28  LOT: '22MG'$ [id number]  Exp.Date: 3/25 ? ?Dosing instructions: Take 1 tablet by mouth daily  ? ?The patient has been instructed regarding the correct time, dose, and frequency of taking this medication, including desired effects and most common side effects.  ? ?Neil Bush ?12:07 PM ?07/05/2021  ?

## 2021-07-10 ENCOUNTER — Encounter: Payer: Self-pay | Admitting: *Deleted

## 2021-07-10 NOTE — Research (Unsigned)
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 He states he would like to speak with Dr Rockey Situ about this. He states ok to mail him information.  ?

## 2021-07-15 ENCOUNTER — Other Ambulatory Visit: Payer: Self-pay | Admitting: Family Medicine

## 2021-07-16 NOTE — Telephone Encounter (Signed)
last RF 04/18/21 #90 1 RF- should have enough to next appointment ?Requested Prescriptions  ?Refused Prescriptions Disp Refills  ?? lisinopril (ZESTRIL) 20 MG tablet [Pharmacy Med Name: LISINOPRIL 20 MG TABLET] 90 tablet 0  ?  Sig: Take 1 tablet (20 mg total) by mouth daily.  ?  ? Cardiovascular:  ACE Inhibitors Failed - 07/15/2021  7:55 AM  ?  ?  Failed - Cr in normal range and within 180 days  ?  Creatinine  ?Date Value Ref Range Status  ?10/30/2011 1.05 0.60 - 1.30 mg/dL Final  ? ?Creatinine, Ser  ?Date Value Ref Range Status  ?04/18/2021 1.35 (H) 0.76 - 1.27 mg/dL Final  ?   ?  ?  Passed - K in normal range and within 180 days  ?  Potassium  ?Date Value Ref Range Status  ?04/18/2021 4.5 3.5 - 5.2 mmol/L Final  ?10/30/2011 4.3 3.5 - 5.1 mmol/L Final  ?   ?  ?  Passed - Patient is not pregnant  ?  ?  Passed - Last BP in normal range  ?  BP Readings from Last 1 Encounters:  ?07/05/21 120/80  ?   ?  ?  Passed - Valid encounter within last 6 months  ?  Recent Outpatient Visits   ?      ? 2 months ago Routine general medical examination at a health care facility  ? [place] P, DO  ? 9 months ago Mixed hyperlipidemia  ? [place], DO  ? 1 year ago Routine general medical examination at a health care facility  ? [place], Megan P, DO  ? 1 year ago Migraine with aura and without status migrainosus, not intractable  ? [facility] Kathrine Haddock, NP  ? 1 year ago Paroxysmal atrial fibrillation [facility])  ? [place], [place] P, DO  ?  ?  ?Future Appointments   ?        ? In 3 months Wynetta Emery, Barb Merino, DO [facility]  ?  ? ?  ?  ?  ? ? ?

## 2021-07-22 ENCOUNTER — Telehealth: Payer: Self-pay | Admitting: Cardiovascular Disease

## 2021-07-22 MED ORDER — DABIGATRAN ETEXILATE MESYLATE 150 MG PO CAPS
150.0000 mg | ORAL_CAPSULE | Freq: Two times a day (BID) | ORAL | 3 refills | Status: DC
Start: 2021-07-22 — End: 2021-10-17

## 2021-07-22 NOTE — Telephone Encounter (Signed)
Pt c/o medication issue:  1. Name of Medication: rivaroxaban (XARELTO) 20 MG TABS tablet  2. How are you currently taking this medication (dosage and times per day)? As prescribed   3. Are you having a reaction (difficulty breathing--STAT)? No   4. What is your medication issue? Patient is calling stating he spoke with his pharmacy about the price of pradaxa before switching his Xarelto to it as previously discussed. The pharmacy advised they are unable to give him a price on it without a prescription. He is requesting a prescription be sent over to [place] due to this.

## 2021-07-22 NOTE — Telephone Encounter (Signed)
Received fax from pt's pharmacy that Pradaxa is non-formulary on his insurance plan and will not be covered.

## 2021-07-22 NOTE — Telephone Encounter (Signed)
Per OV from 07/05/21, pt was to ask pharmacy about price of pradaxa.   Spoke with Dr. Rockey Situ regarding dose. Per Dr. Rockey Situ, send in 150 mg BID. RX for pradaxa sent into pharmacy so patient can find out price of Pradaxa.   Pt notified. Pt verbalized understanding and will let us know if he chooses to remain on eliquis or switch to pradaxa.

## 2021-08-26 ENCOUNTER — Other Ambulatory Visit: Payer: Self-pay | Admitting: Cardiovascular Disease

## 2021-10-17 ENCOUNTER — Ambulatory Visit (INDEPENDENT_AMBULATORY_CARE_PROVIDER_SITE_OTHER): Payer: PPO | Admitting: Family Medicine

## 2021-10-17 ENCOUNTER — Encounter: Payer: Self-pay | Admitting: Family Medicine

## 2021-10-17 VITALS — BP 126/72 | HR 58 | Temp 97.9°F | Wt 182.1 lb

## 2021-10-17 DIAGNOSIS — H532 Diplopia: Secondary | ICD-10-CM

## 2021-10-17 DIAGNOSIS — R0789 Other chest pain: Secondary | ICD-10-CM

## 2021-10-17 DIAGNOSIS — E782 Mixed hyperlipidemia: Secondary | ICD-10-CM

## 2021-10-17 DIAGNOSIS — N400 Enlarged prostate without lower urinary tract symptoms: Secondary | ICD-10-CM | POA: Diagnosis not present

## 2021-10-17 DIAGNOSIS — I10 Essential (primary) hypertension: Secondary | ICD-10-CM | POA: Diagnosis not present

## 2021-10-17 DIAGNOSIS — R3911 Hesitancy of micturition: Secondary | ICD-10-CM

## 2021-10-17 DIAGNOSIS — R5382 Chronic fatigue, unspecified: Secondary | ICD-10-CM

## 2021-10-17 MED ORDER — LISINOPRIL 20 MG PO TABS: 20.0000 mg | ORAL_TABLET | Freq: Every day | ORAL | 1 refills | Status: DC

## 2021-10-17 NOTE — Progress Notes (Signed)
BP 126/72   Pulse (!) 58   Temp 97.9 F (36.6 C)   Wt 182 lb 1.6 oz (82.6 kg)   SpO2 97%   BMI 26.13 kg/m    Subjective:    Patient ID: Neil Bush, male    DOB: April 19, 1950, 71 y.o.   MRN: [id number]  HPI: Neil Bush is a 71 y.o. male  Chief Complaint  Patient presents with   Hyperlipidemia   Hypertension   Fatigue    Patient states he stays tired all of the time and does not have much energy. States he is able to sleep at night with no problem.   FATIGUE Duration:  5-6 weeks Severity: moderate  Onset: gradual Context when symptoms started:   heat exhaustion Symptoms improve with rest: yes  Depressive symptoms: no Stress/anxiety: no Insomnia: no  Snoring: no Observed apnea by bed partner: no Daytime hypersomnolence:yes Wakes feeling refreshed: no History of sleep study: no Dysnea on exertion:  no Orthopnea/PND: no Chest pain: yes Chronic cough: no Lower extremity edema: no Arthralgias:no Myalgias: no Weakness: no Rash: no  HYPERTENSION / HYPERLIPIDEMIA Satisfied with current treatment? yes Duration of hypertension: chronic BP monitoring frequency: not checking BP medication side effects: no Past BP meds: lisinopril, metoprolol Duration of hyperlipidemia: chronic Cholesterol medication side effects: no Cholesterol supplements: none Past cholesterol medications: zetia Medication compliance: excellent compliance Aspirin: no Recent stressors: no Recurrent headaches: no Visual changes: no Palpitations: no Dyspnea: no Chest pain: no Lower extremity edema: no Dizzy/lightheaded: no  Duration:weeks, off and on Onset: gradual Quality: tight Severity: mild Location: upper sternum Radiation: none  Frequency: intermittent Related to exertion: no Trauma: no Anxiety/recent stressors: no Status: fluctuating Treatments attempted: nothing  Current pain status: in pain Shortness of breath: no Cough: no Nausea: no Diaphoresis: no Heartburn:  no Palpitations: no   Relevant past medical, surgical, family and social history reviewed and updated as indicated. Interim medical history since our last visit reviewed. Allergies and medications reviewed and updated.  Review of Systems  Constitutional: Negative.   Respiratory: Negative.    Cardiovascular: Negative.   Gastrointestinal: Negative.   Musculoskeletal: Negative.   Neurological: Negative.   Psychiatric/Behavioral: Negative.      Per HPI unless specifically indicated above     Objective:    BP 126/72   Pulse (!) 58   Temp 97.9 F (36.6 C)   Wt 182 lb 1.6 oz (82.6 kg)   SpO2 97%   BMI 26.13 kg/m   Wt Readings from Last 3 Encounters:  10/17/21 182 lb 1.6 oz (82.6 kg)  07/05/21 180 lb 4 oz (81.8 kg)  04/18/21 182 lb 3.2 oz (82.6 kg)    Physical Exam Vitals and nursing note reviewed.  Constitutional:      General: He is not in acute distress.    Appearance: Normal appearance. He is normal weight. He is not ill-appearing, toxic-appearing or diaphoretic.  HENT:     Head: Normocephalic and atraumatic.     Right Ear: External ear normal.     Left Ear: External ear normal.     Nose: Nose normal.     Mouth/Throat:     Mouth: Mucous membranes are moist.     Pharynx: Oropharynx is clear.  Eyes:     General: No scleral icterus.       Right eye: No discharge.        Left eye: No discharge.     Extraocular Movements: Extraocular movements intact.  Conjunctiva/sclera: Conjunctivae normal.     Pupils: Pupils are equal, round, and reactive to light.  Cardiovascular:     Rate and Rhythm: Normal rate and regular rhythm.     Pulses: Normal pulses.     Heart sounds: Normal heart sounds. No murmur heard.    No friction rub. No gallop.  Pulmonary:     Effort: Pulmonary effort is normal. No respiratory distress.     Breath sounds: Normal breath sounds. No stridor. No wheezing, rhonchi or rales.  Chest:     Chest wall: No tenderness.  Musculoskeletal:         General: Normal range of motion.     Cervical back: Normal range of motion and neck supple.  Skin:    General: Skin is warm and dry.     Capillary Refill: Capillary refill takes less than 2 seconds.     Coloration: Skin is not jaundiced or pale.     Findings: No bruising, erythema, lesion or rash.  Neurological:     General: No focal deficit present.     Mental Status: He is alert and oriented to person, place, and time. Mental status is at baseline.  Psychiatric:        Mood and Affect: Mood normal.        Behavior: Behavior normal.        Thought Content: Thought content normal.        Judgment: Judgment normal.     Results for orders placed or performed in visit on 04/18/21  Microscopic Examination   Urine  Result Value Ref Range   WBC, UA None seen 0 - 5 /hpf   RBC, Urine 0-2 0 - 2 /hpf   Epithelial Cells (non renal) None seen 0 - 10 /hpf   Bacteria, UA None seen None seen/Few  Comprehensive metabolic panel  Result Value Ref Range   Glucose 100 (H) 70 - 99 mg/dL   BUN 23 8 - 27 mg/dL   Creatinine, Ser 1.35 (H) 0.76 - 1.27 mg/dL   eGFR 56 (L) >59 mL/min/1.73   BUN/Creatinine Ratio 17 10 - 24   Sodium 140 134 - 144 mmol/L   Potassium 4.5 3.5 - 5.2 mmol/L   Chloride 103 96 - 106 mmol/L   CO2 25 20 - 29 mmol/L   Calcium 9.2 8.6 - 10.2 mg/dL   Total Protein 7.0 6.0 - 8.5 g/dL   Albumin 4.5 3.7 - 4.7 g/dL   Globulin, Total 2.5 1.5 - 4.5 g/dL   Albumin/Globulin Ratio 1.8 1.2 - 2.2   Bilirubin Total 0.5 0.0 - 1.2 mg/dL   Alkaline Phosphatase 83 44 - 121 IU/L   AST 16 0 - 40 IU/L   ALT 16 0 - 44 IU/L  CBC with Differential/Platelet  Result Value Ref Range   WBC 6.9 3.4 - 10.8 x10E3/uL   RBC 5.20 4.14 - 5.80 x10E6/uL   Hemoglobin 17.0 13.0 - 17.7 g/dL   Hematocrit 49.0 37.5 - 51.0 %   MCV 94 79 - 97 fL   MCH 32.7 26.6 - 33.0 pg   MCHC 34.7 31.5 - 35.7 g/dL   RDW 12.9 11.6 - 15.4 %   Platelets 275 150 - 450 x10E3/uL   Neutrophils 65 Not Estab. %   Lymphs 25 Not  Estab. %   Monocytes 7 Not Estab. %   Eos 2 Not Estab. %   Basos 1 Not Estab. %   Neutrophils Absolute 4.4 1.4 - 7.0 x10E3/uL  Lymphocytes Absolute 1.7 0.7 - 3.1 x10E3/uL   Monocytes Absolute 0.5 0.1 - 0.9 x10E3/uL   EOS (ABSOLUTE) 0.2 0.0 - 0.4 x10E3/uL   Basophils Absolute 0.1 0.0 - 0.2 x10E3/uL   Immature Granulocytes 0 Not Estab. %   Immature Grans (Abs) 0.0 0.0 - 0.1 x10E3/uL  Lipid Panel w/o Chol/HDL Ratio  Result Value Ref Range   Cholesterol, Total 219 (H) 100 - 199 mg/dL   Triglycerides 177 (H) 0 - 149 mg/dL   HDL 41 >39 mg/dL   VLDL Cholesterol Cal 32 5 - 40 mg/dL   LDL Chol Calc (NIH) 146 (H) 0 - 99 mg/dL  TSH  Result Value Ref Range   TSH 1.570 0.450 - 4.500 uIU/mL  Urinalysis, Routine w reflex microscopic  Result Value Ref Range   Specific Gravity, UA 1.020 1.005 - 1.030   pH, UA 5.5 5.0 - 7.5   Color, UA Yellow Yellow   Appearance Ur Clear Clear   Leukocytes,UA Negative Negative   Protein,UA Negative Negative/Trace   Glucose, UA Negative Negative   Ketones, UA Negative Negative   RBC, UA Trace (A) Negative   Bilirubin, UA Negative Negative   Urobilinogen, Ur 0.2 0.2 - 1.0 mg/dL   Nitrite, UA Negative Negative   Microscopic Examination See below:   Microalbumin, Urine Waived  Result Value Ref Range   Microalb, Ur Waived 10 0 - 19 mg/L   Creatinine, Urine Waived 200 10 - 300 mg/dL   Microalb/Creat Ratio <30 <30 mg/g      Assessment & Plan:   Problem List Items Addressed This Visit       Cardiovascular and Mediastinum   Essential hypertension    Under good control on current regimen. Continue current regimen. Continue to monitor. Call with any concerns. Refills given. Labs drawn today.       Relevant Medications   lisinopril (ZESTRIL) 20 MG tablet   Other Relevant Orders   CBC with Differential/Platelet   Comprehensive metabolic panel     Genitourinary   BPH (benign prostatic hyperplasia)    Rechecking labs today. Await results. Treat as  needed.         Other   Hyperlipidemia - Primary    Under good control on current regimen. Continue current regimen. Continue to monitor. Call with any concerns. Refills given. Labs drawn today.       Relevant Medications   lisinopril (ZESTRIL) 20 MG tablet   Other Relevant Orders   CBC with Differential/Platelet   Comprehensive metabolic panel   Lipid Panel w/o Chol/HDL Ratio   Other Visit Diagnoses     Chronic fatigue       Checking labs today. Await results. Treat as needed.    Relevant Orders   TSH   Lyme Disease Serology w/Reflex   Rocky mtn spotted fvr abs pnl(IgG+IgM)   Babesia microti Antibody Panel   Ehrlichia Antibody Panel   Testosterone, free, total([facility])   Chest tightness       EKG normal. Will check labs. Await results. Warning signs to go to ER discussed.    Relevant Orders   EKG 12-Lead (Completed)   Double vision       Seeing opthlmology tomorrow. Checking labs today. Await results. Treat as needed.    Relevant Orders   Prolactin   Hesitancy       Checking labs today. Await results. Treat as needed.    Relevant Orders   PSA        Follow up plan:  Return in about 4 weeks (around 11/14/2021).

## 2021-10-17 NOTE — Assessment & Plan Note (Signed)
Under good control on current regimen. Continue current regimen. Continue to monitor. Call with any concerns. Refills given. Labs drawn today.   

## 2021-10-17 NOTE — Assessment & Plan Note (Signed)
Rechecking labs today. Await results. Treat as needed.  °

## 2021-10-18 ENCOUNTER — Other Ambulatory Visit: Payer: Self-pay | Admitting: Ophthalmology

## 2021-10-18 DIAGNOSIS — H4912 Fourth [trochlear] nerve palsy, left eye: Secondary | ICD-10-CM

## 2021-10-20 NOTE — Progress Notes (Signed)
Interpreted by me on 10/17/21 Bradycardic at 53bpm. No ST segment changes

## 2021-10-25 ENCOUNTER — Encounter: Payer: Self-pay | Admitting: Family Medicine

## 2021-10-25 ENCOUNTER — Ambulatory Visit
Admission: RE | Admit: 2021-10-25 | Discharge: 2021-10-25 | Disposition: A | Discharge status: Home / Self Care | Payer: PPO | Source: Ambulatory Visit | Attending: Ophthalmology | Admitting: Ophthalmology

## 2021-10-25 DIAGNOSIS — H4912 Fourth [trochlear] nerve palsy, left eye: Secondary | ICD-10-CM | POA: Diagnosis not present

## 2021-10-25 DIAGNOSIS — H538 Other visual disturbances: Secondary | ICD-10-CM | POA: Diagnosis not present

## 2021-10-25 LAB — EHRLICHIA ANTIBODY PANEL
E. Chaffeensis (HME) IgM Titer: NEGATIVE
E.Chaffeensis (HME) IgG: NEGATIVE
HGE IgG Titer: NEGATIVE
HGE IgM Titer: NEGATIVE

## 2021-10-25 LAB — COMPREHENSIVE METABOLIC PANEL
ALT: 18 IU/L (ref 0–44)
AST: 15 IU/L (ref 0–40)
Albumin/Globulin Ratio: 1.8 (ref 1.2–2.2)
Albumin: 4.1 g/dL (ref 3.8–4.8)
Alkaline Phosphatase: 86 IU/L (ref 44–121)
BUN/Creatinine Ratio: 13 (ref 10–24)
BUN: 17 mg/dL (ref 8–27)
Bilirubin Total: 0.7 mg/dL (ref 0.0–1.2)
CO2: 24 mmol/L (ref 20–29)
Calcium: 8.8 mg/dL (ref 8.6–10.2)
Chloride: 103 mmol/L (ref 96–106)
Creatinine, Ser: 1.32 mg/dL — ABNORMAL HIGH (ref 0.76–1.27)
Globulin, Total: 2.3 g/dL (ref 1.5–4.5)
Glucose: 95 mg/dL (ref 70–99)
Potassium: 4.5 mmol/L (ref 3.5–5.2)
Sodium: 141 mmol/L (ref 134–144)
Total Protein: 6.4 g/dL (ref 6.0–8.5)
eGFR: 57 mL/min/{1.73_m2} — ABNORMAL LOW (ref >59)

## 2021-10-25 LAB — CBC WITH DIFFERENTIAL/PLATELET
Basophils Absolute: 0.1 10*3/uL (ref 0.0–0.2)
Basos: 1 %
EOS (ABSOLUTE): 0.1 10*3/uL (ref 0.0–0.4)
Eos: 2 %
Hematocrit: 46.6 % (ref 37.5–51.0)
Hemoglobin: 16 g/dL (ref 13.0–17.7)
Immature Grans (Abs): 0 10*3/uL (ref 0.0–0.1)
Immature Granulocytes: 0 %
Lymphocytes Absolute: 1.6 10*3/uL (ref 0.7–3.1)
Lymphs: 27 %
MCH: 32.7 pg (ref 26.6–33.0)
MCHC: 34.3 g/dL (ref 31.5–35.7)
MCV: 95 fL (ref 79–97)
Monocytes Absolute: 0.4 10*3/uL (ref 0.1–0.9)
Monocytes: 8 %
Neutrophils Absolute: 3.5 10*3/uL (ref 1.4–7.0)
Neutrophils: 62 %
Platelets: 229 10*3/uL (ref 150–450)
RBC: 4.89 x10E6/uL (ref 4.14–5.80)
RDW: 13 % (ref 11.6–15.4)
WBC: 5.7 10*3/uL (ref 3.4–10.8)

## 2021-10-25 LAB — LIPID PANEL W/O CHOL/HDL RATIO
Cholesterol, Total: 168 mg/dL (ref 100–199)
HDL: 41 mg/dL (ref >39)
LDL Chol Calc (NIH): 100 mg/dL — ABNORMAL HIGH (ref 0–99)
Triglycerides: 156 mg/dL — ABNORMAL HIGH (ref 0–149)
VLDL Cholesterol Cal: 27 mg/dL (ref 5–40)

## 2021-10-25 LAB — TSH: TSH: 1.11 u[IU]/mL (ref 0.450–4.500)

## 2021-10-25 LAB — PROLACTIN: Prolactin: 12 ng/mL (ref 4.0–15.2)

## 2021-10-25 LAB — TESTOSTERONE, FREE, TOTAL, SHBG
Sex Hormone Binding: 22.4 nmol/L (ref 19.3–76.4)
Testosterone, Free: 4.5 pg/mL — ABNORMAL LOW (ref 6.6–18.1)
Testosterone: 286 ng/dL (ref 264–916)

## 2021-10-25 LAB — LYME DISEASE SEROLOGY W/REFLEX: Lyme Total Antibody EIA: NEGATIVE

## 2021-10-25 LAB — BABESIA MICROTI ANTIBODY PANEL
Babesia microti IgG: <1:10 {titer}
Babesia microti IgM: <1:10 {titer}

## 2021-10-25 LAB — PSA: Prostate Specific Ag, Serum: 1.5 ng/mL (ref 0.0–4.0)

## 2021-10-25 LAB — ROCKY MTN SPOTTED FVR ABS PNL(IGG+IGM)
RMSF IgG: NEGATIVE
RMSF IgM: 0.24 index (ref 0.00–0.89)

## 2021-10-25 MED ORDER — GADOBUTROL 1 MMOL/ML IV SOLN
8.0000 mL | Freq: Once | INTRAVENOUS | Status: AC | PRN
  Administered 2021-10-25: 8 mL via INTRAVENOUS

## 2021-11-21 ENCOUNTER — Ambulatory Visit (INDEPENDENT_AMBULATORY_CARE_PROVIDER_SITE_OTHER): Payer: PPO | Admitting: Family Medicine

## 2021-11-21 ENCOUNTER — Encounter: Payer: Self-pay | Admitting: Family Medicine

## 2021-11-21 VITALS — BP 154/78 | HR 56 | Temp 97.9°F | Wt 182.2 lb

## 2021-11-21 DIAGNOSIS — R5382 Chronic fatigue, unspecified: Secondary | ICD-10-CM | POA: Diagnosis not present

## 2021-11-21 DIAGNOSIS — R930 Abnormal findings on diagnostic imaging of skull and head, not elsewhere classified: Secondary | ICD-10-CM

## 2021-11-21 DIAGNOSIS — E782 Mixed hyperlipidemia: Secondary | ICD-10-CM | POA: Diagnosis not present

## 2021-11-21 MED ORDER — ATORVASTATIN CALCIUM 10 MG PO TABS: 10.0000 mg | ORAL_TABLET | Freq: Every day | ORAL | 1 refills | Status: DC

## 2021-11-21 NOTE — Progress Notes (Signed)
BP (!) 154/78   Pulse (!) 56   Temp 97.9 F (36.6 C)   Wt 182 lb 3.2 oz (82.6 kg)   SpO2 98%   BMI 26.14 kg/m    Subjective:    Patient ID: Neil Bush, male    DOB: 07-07-1950, 71 y.o.   MRN: [id number]  HPI: Neil Bush is a 71 y.o. male  Chief Complaint  Patient presents with   Fatigue    Patient states he is feeling better now, feels like he's got his energy back. Patient states he is no longer having chest tightness.  Patient states he has seen his eye doctor and had MRI but has not had results yet, has appointment tomorrow with eye doctor again    HYPERLIPIDEMIA Hyperlipidemia status: excellent compliance Satisfied with current treatment?  yes Side effects:  no Medication compliance: good compliance Past cholesterol meds: atorvastatin, zetia Supplements: none Aspirin:  no The 10-year ASCVD risk score (Arnett DK, et al., 2019) is: 33.5%   Values used to calculate the score:     Age: 69 years     Sex: Male     Is Non-Hispanic African American: No     Diabetic: No     Tobacco smoker: No     Systolic Blood Pressure: 409 mmHg     Is BP treated: Yes     HDL Cholesterol: 41 mg/dL     Total Cholesterol: 168 mg/dL Chest pain:  no  Had MRI with ophthalmology- seeing them tomorrow. Does not know results.   Fatigue has resolved.    Relevant past medical, surgical, family and social history reviewed and updated as indicated. Interim medical history since our last visit reviewed. Allergies and medications reviewed and updated.  Review of Systems  Constitutional: Negative.   Respiratory: Negative.    Cardiovascular: Negative.   Gastrointestinal: Negative.   Musculoskeletal: Negative.   Neurological: Negative.   Psychiatric/Behavioral: Negative.      Per HPI unless specifically indicated above     Objective:    BP (!) 154/78   Pulse (!) 56   Temp 97.9 F (36.6 C)   Wt 182 lb 3.2 oz (82.6 kg)   SpO2 98%   BMI 26.14 kg/m   Wt Readings from Last 3  Encounters:  11/21/21 182 lb 3.2 oz (82.6 kg)  10/17/21 182 lb 1.6 oz (82.6 kg)  07/05/21 180 lb 4 oz (81.8 kg)    Physical Exam Vitals and nursing note reviewed.  Constitutional:      General: He is not in acute distress.    Appearance: Normal appearance. He is not ill-appearing, toxic-appearing or diaphoretic.  HENT:     Head: Normocephalic and atraumatic.     Right Ear: External ear normal.     Left Ear: External ear normal.     Nose: Nose normal.     Mouth/Throat:     Mouth: Mucous membranes are moist.     Pharynx: Oropharynx is clear.  Eyes:     General: No scleral icterus.       Right eye: No discharge.        Left eye: No discharge.     Extraocular Movements: Extraocular movements intact.     Conjunctiva/sclera: Conjunctivae normal.     Pupils: Pupils are equal, round, and reactive to light.  Cardiovascular:     Rate and Rhythm: Normal rate and regular rhythm.     Pulses: Normal pulses.     Heart sounds: Normal heart  sounds. No murmur heard.    No friction rub. No gallop.  Pulmonary:     Effort: Pulmonary effort is normal. No respiratory distress.     Breath sounds: Normal breath sounds. No stridor. No wheezing, rhonchi or rales.  Chest:     Chest wall: No tenderness.  Musculoskeletal:        General: Normal range of motion.     Cervical back: Normal range of motion and neck supple.  Skin:    General: Skin is warm and dry.     Capillary Refill: Capillary refill takes less than 2 seconds.     Coloration: Skin is not jaundiced or pale.     Findings: No bruising, erythema, lesion or rash.  Neurological:     General: No focal deficit present.     Mental Status: He is alert and oriented to person, place, and time. Mental status is at baseline.  Psychiatric:        Mood and Affect: Mood normal.        Behavior: Behavior normal.        Thought Content: Thought content normal.        Judgment: Judgment normal.    CLINICAL DATA:  Blurry vision episodes of few weeks  ago. Concern for CVA.   EXAM: MRI HEAD WITHOUT AND WITH CONTRAST   TECHNIQUE: Multiplanar, multiecho pulse sequences of the brain and surrounding structures were obtained without and with intravenous contrast.   CONTRAST:  55m GADAVIST GADOBUTROL 1 MMOL/ML IV SOLN   COMPARISON:  CTA head 03/22/2021   FINDINGS: Brain: There is no evidence of acute intracranial hemorrhage or extra-axial fluid collection. There is no evidence of acute or recent infarct.   Parenchymal volume is normal for age. The ventricles are normal in size. Parenchymal signal is normal with no burden of underlying white matter microangiopathic change. Is a single focus of chronic microhemorrhage in the left external capsule, nonspecific.   There is no mass lesion. There is no abnormal enhancement. There is no mass effect or midline shift.   Vascular: Normal flow voids.   Skull and upper cervical spine: Normal marrow signal.   Sinuses/Orbits: There is near-complete opacification of the right maxillary sinus. Bilateral lens implants are in place. The globes and orbits are otherwise unremarkable.   Other: None.   IMPRESSION: 1. Essentially normal appearance of the brain with no evidence of acute or recent infarct. 2. Near-complete opacification of the right maxillary sinus.  Results for orders placed or performed in visit on 10/17/21  CBC with Differential/Platelet  Result Value Ref Range   WBC 5.7 3.4 - 10.8 x10E3/uL   RBC 4.89 4.14 - 5.80 x10E6/uL   Hemoglobin 16.0 13.0 - 17.7 g/dL   Hematocrit 46.6 37.5 - 51.0 %   MCV 95 79 - 97 fL   MCH 32.7 26.6 - 33.0 pg   MCHC 34.3 31.5 - 35.7 g/dL   RDW 13.0 11.6 - 15.4 %   Platelets 229 150 - 450 x10E3/uL   Neutrophils 62 Not Estab. %   Lymphs 27 Not Estab. %   Monocytes 8 Not Estab. %   Eos 2 Not Estab. %   Basos 1 Not Estab. %   Neutrophils Absolute 3.5 1.4 - 7.0 x10E3/uL   Lymphocytes Absolute 1.6 0.7 - 3.1 x10E3/uL   Monocytes Absolute 0.4 0.1 -  0.9 x10E3/uL   EOS (ABSOLUTE) 0.1 0.0 - 0.4 x10E3/uL   Basophils Absolute 0.1 0.0 - 0.2 x10E3/uL   Immature Granulocytes  0 Not Estab. %   Immature Grans (Abs) 0.0 0.0 - 0.1 x10E3/uL  Comprehensive metabolic panel  Result Value Ref Range   Glucose 95 70 - 99 mg/dL   BUN 17 8 - 27 mg/dL   Creatinine, Ser 1.32 (H) 0.76 - 1.27 mg/dL   eGFR 57 (L) >59 mL/min/1.73   BUN/Creatinine Ratio 13 10 - 24   Sodium 141 134 - 144 mmol/L   Potassium 4.5 3.5 - 5.2 mmol/L   Chloride 103 96 - 106 mmol/L   CO2 24 20 - 29 mmol/L   Calcium 8.8 8.6 - 10.2 mg/dL   Total Protein 6.4 6.0 - 8.5 g/dL   Albumin 4.1 3.8 - 4.8 g/dL   Globulin, Total 2.3 1.5 - 4.5 g/dL   Albumin/Globulin Ratio 1.8 1.2 - 2.2   Bilirubin Total 0.7 0.0 - 1.2 mg/dL   Alkaline Phosphatase 86 44 - 121 IU/L   AST 15 0 - 40 IU/L   ALT 18 0 - 44 IU/L  Lipid Panel w/o Chol/HDL Ratio  Result Value Ref Range   Cholesterol, Total 168 100 - 199 mg/dL   Triglycerides 156 (H) 0 - 149 mg/dL   HDL 41 >39 mg/dL   VLDL Cholesterol Cal 27 5 - 40 mg/dL   LDL Chol Calc (NIH) 100 (H) 0 - 99 mg/dL  TSH  Result Value Ref Range   TSH 1.110 0.450 - 4.500 uIU/mL  Lyme Disease Serology w/Reflex  Result Value Ref Range   Lyme Total Antibody EIA Negative Negative  Rocky mtn spotted fvr abs pnl(IgG+IgM)  Result Value Ref Range   RMSF IgG Negative Negative   RMSF IgM 0.24 0.00 - 0.89 index  Babesia microti Antibody Panel  Result Value Ref Range   Babesia microti IgG <1:10 Neg:<1:10   Babesia microti IgM <1:10 Neg:<1:10   Result Comment: Comment   Ehrlichia Antibody Panel  Result Value Ref Range   E.Chaffeensis (HME) IgG Negative Neg:<1:64   E. Chaffeensis (HME) IgM Titer Negative Neg:<1:20   HGE IgG Titer Negative Neg:<1:64   HGE IgM Titer Negative Neg:<1:20   Result Comment: Comment   Testosterone, free, total([facility])  Result Value Ref Range   Testosterone 286 264 - 916 ng/dL   Testosterone, Free 4.5 (L) 6.6 - 18.1 pg/mL   Sex  Hormone Binding 22.4 19.3 - 76.4 nmol/L  Prolactin  Result Value Ref Range   Prolactin 12.0 4.0 - 15.2 ng/mL  PSA  Result Value Ref Range   Prostate Specific Ag, Serum 1.5 0.0 - 4.0 ng/mL      Assessment & Plan:   Problem List Items Addressed This Visit       Other   Hyperlipidemia - Primary    Back on atorvastatin. Feeling well. No concerns. Rechecking labs today.      Relevant Medications   atorvastatin (LIPITOR) 10 MG tablet   Other Relevant Orders   Comprehensive metabolic panel   Lipid Panel w/o Chol/HDL Ratio   Other Visit Diagnoses     Abnormal head MRI       Completely opacified R maxillary sinus on MRI. Will get him into ENT for further evaluation. Call with any concerns.    Relevant Orders   Ambulatory referral to ENT   Chronic fatigue       Resolved. Feeling more like himself.         Follow up plan: Return in about 5 months (around 04/23/2022) for physical.

## 2021-11-21 NOTE — Assessment & Plan Note (Signed)
Back on atorvastatin. Feeling well. No concerns. Rechecking labs today.

## 2021-11-22 ENCOUNTER — Encounter: Payer: Self-pay | Admitting: Family Medicine

## 2021-11-22 DIAGNOSIS — H4912 Fourth [trochlear] nerve palsy, left eye: Secondary | ICD-10-CM | POA: Diagnosis not present

## 2021-11-22 LAB — COMPREHENSIVE METABOLIC PANEL
ALT: 17 IU/L (ref 0–44)
AST: 16 IU/L (ref 0–40)
Albumin/Globulin Ratio: 1.9 (ref 1.2–2.2)
Albumin: 4.1 g/dL (ref 3.8–4.8)
Alkaline Phosphatase: 82 IU/L (ref 44–121)
BUN/Creatinine Ratio: 14 (ref 10–24)
BUN: 19 mg/dL (ref 8–27)
Bilirubin Total: 0.5 mg/dL (ref 0.0–1.2)
CO2: 25 mmol/L (ref 20–29)
Calcium: 8.9 mg/dL (ref 8.6–10.2)
Chloride: 104 mmol/L (ref 96–106)
Creatinine, Ser: 1.37 mg/dL — ABNORMAL HIGH (ref 0.76–1.27)
Globulin, Total: 2.2 g/dL (ref 1.5–4.5)
Glucose: 92 mg/dL (ref 70–99)
Potassium: 4.5 mmol/L (ref 3.5–5.2)
Sodium: 141 mmol/L (ref 134–144)
Total Protein: 6.3 g/dL (ref 6.0–8.5)
eGFR: 54 mL/min/{1.73_m2} — ABNORMAL LOW (ref >59)

## 2021-11-22 LAB — LIPID PANEL W/O CHOL/HDL RATIO
Cholesterol, Total: 120 mg/dL (ref 100–199)
HDL: 38 mg/dL — ABNORMAL LOW (ref >39)
LDL Chol Calc (NIH): 56 mg/dL (ref 0–99)
Triglycerides: 151 mg/dL — ABNORMAL HIGH (ref 0–149)
VLDL Cholesterol Cal: 26 mg/dL (ref 5–40)

## 2021-12-12 DIAGNOSIS — J320 Chronic maxillary sinusitis: Secondary | ICD-10-CM | POA: Diagnosis not present

## 2021-12-12 DIAGNOSIS — J0140 Acute pansinusitis, unspecified: Secondary | ICD-10-CM | POA: Diagnosis not present

## 2021-12-12 DIAGNOSIS — J342 Deviated nasal septum: Secondary | ICD-10-CM | POA: Diagnosis not present

## 2021-12-27 DIAGNOSIS — I4891 Unspecified atrial fibrillation: Secondary | ICD-10-CM | POA: Diagnosis not present

## 2021-12-27 DIAGNOSIS — Z9849 Cataract extraction status, unspecified eye: Secondary | ICD-10-CM | POA: Diagnosis not present

## 2021-12-27 DIAGNOSIS — I700 Atherosclerosis of aorta: Secondary | ICD-10-CM | POA: Diagnosis not present

## 2021-12-27 DIAGNOSIS — E663 Overweight: Secondary | ICD-10-CM | POA: Diagnosis not present

## 2021-12-27 DIAGNOSIS — Z87891 Personal history of nicotine dependence: Secondary | ICD-10-CM | POA: Diagnosis not present

## 2021-12-27 DIAGNOSIS — E785 Hyperlipidemia, unspecified: Secondary | ICD-10-CM | POA: Diagnosis not present

## 2021-12-27 DIAGNOSIS — D6869 Other thrombophilia: Secondary | ICD-10-CM | POA: Diagnosis not present

## 2021-12-27 DIAGNOSIS — I10 Essential (primary) hypertension: Secondary | ICD-10-CM | POA: Diagnosis not present

## 2021-12-27 DIAGNOSIS — Z7901 Long term (current) use of anticoagulants: Secondary | ICD-10-CM | POA: Diagnosis not present

## 2021-12-27 DIAGNOSIS — G8929 Other chronic pain: Secondary | ICD-10-CM | POA: Diagnosis not present

## 2022-01-16 DIAGNOSIS — J320 Chronic maxillary sinusitis: Secondary | ICD-10-CM | POA: Diagnosis not present

## 2022-02-06 ENCOUNTER — Telehealth: Payer: Self-pay | Admitting: Cardiovascular Disease

## 2022-02-06 ENCOUNTER — Telehealth: Payer: Self-pay

## 2022-02-06 ENCOUNTER — Other Ambulatory Visit: Payer: Self-pay

## 2022-02-06 DIAGNOSIS — Z86010 Personal history of colonic polyps: Secondary | ICD-10-CM

## 2022-02-06 MED ORDER — NA SULFATE-K SULFATE-MG SULF 17.5-3.13-1.6 GM/177ML PO SOLN: 1.0000 | Freq: Once | ORAL | 0 refills | Status: AC

## 2022-02-06 NOTE — Telephone Encounter (Signed)
Gastroenterology Pre-Procedure Review  Request Date: 04/21/22 Requesting Physician: Dr. Allen Norris  PATIENT REVIEW QUESTIONS: The patient responded to the following health history questions as indicated:    1. Are you having any GI issues? no 2. Do you have a personal history of Polyps? yes (last colonoscopy performed by Dr. Allen Norris 04/17/17) 3. Do you have a family history of Colon Cancer or Polyps? yes (Cousins colon cancer) 4. Diabetes Mellitus? no 5. Joint replacements in the past 12 months?no 6. Major health problems in the past 3 months?no 7. Any artificial heart valves, MVP, or defibrillator?no    MEDICATIONS & ALLERGIES:    Patient reports the following regarding taking any anticoagulation/antiplatelet therapy:   Plavix, Coumadin, Eliquis, Xarelto, Lovenox, Pradaxa, Brilinta, or Effient? yes (Xarelto prescribed by Dr. Rockey Situ blood thinner request sending by fax) Aspirin? no  Patient confirms/reports the following medications:  Current Outpatient Medications  Medication Sig Dispense Refill   atorvastatin (LIPITOR) 10 MG tablet Take 1 tablet (10 mg total) by mouth daily. 90 tablet 1   flecainide (TAMBOCOR) 100 MG tablet Take 1 tablet (100 mg total) by mouth 2 (two) times daily as needed (daily PRN for atrial fibrillation). 90 tablet 3   lisinopril (ZESTRIL) 20 MG tablet Take 1 tablet (20 mg total) by mouth daily. 90 tablet 1   metoprolol succinate (TOPROL-XL) 50 MG 24 hr tablet Take 0.5 tablets (25 mg total) by mouth daily. Take with or immediately following a meal. 45 tablet 1   rivaroxaban (XARELTO) 20 MG TABS tablet Take 1 tablet (20 mg total) by mouth daily. 90 tablet 1   Current Facility-Administered Medications  Medication Dose Route Frequency Provider Last Rate Last Admin   triamcinolone acetonide (KENALOG-40) injection 20 mg  20 mg Other Once [place], Max T, DPM        Patient confirms/reports the following allergies:  Allergies  Allergen Reactions   Atorvastatin Other (See  Comments)    myalgias   Penicillins Rash    Has patient had a PCN reaction causing immediate rash, facial/tongue/throat swelling, SOB or lightheadedness with hypotension: no Has patient had a PCN reaction causing severe rash involving mucus membranes or skin necrosis: no Has patient had a PCN reaction that required hospitalization no Has patient had a PCN reaction occurring within the last 10 years: no If all of the above answers are "NO", then may proceed with Cephalosporin use.    Childhood allergy    No orders of the defined types were placed in this encounter.   AUTHORIZATION INFORMATION Primary Insurance: 1D#: Group #:  Secondary Insurance: 1D#: Group #:  SCHEDULE INFORMATION: Date: 04/21/22 Time: Location: [place]

## 2022-02-06 NOTE — Telephone Encounter (Signed)
   Pre-operative Risk Assessment    Patient Name: Neil Bush  DOB: 10-16-1950 MRN: [id number]      Request for Surgical Clearance    Procedure:  Colonoscopy  Date of Surgery:  Clearance 04/21/22                                 Surgeon:  Not listed Surgeon's Group or Practice Name:  [facility] Phone number:  [phone] Fax number:  [phone]   Type of Clearance Requested:  Pharmacy, Xarelto    Type of Anesthesia:  Not Indicated   Additional requests/questions:    Signed, Maxwell Caul   02/06/2022, 3:06 PM

## 2022-02-06 NOTE — Telephone Encounter (Signed)
Patient with diagnosis of afib on Xarelto for anticoagulation.    Procedure: colonoscopy Date of procedure: 04/21/22  CHA2DS2-VASc Score = 2  This indicates a 2.2% annual risk of stroke. The patient's score is based upon: CHF History: 0 HTN History: 1 Diabetes History: 0 Stroke History: 0 Vascular Disease History: 0 Age Score: 1 Gender Score: 0   CrCl 45m/min Platelet count 229K  Per office protocol, patient can hold Xarelto for 1-2 days prior to procedure.    **This guidance is not considered finalized until pre-operative APP has relayed final recommendations.**

## 2022-02-06 NOTE — Telephone Encounter (Signed)
   Patient Name: Neil Bush  DOB: [birth year] MRN: [id number]  Primary Cardiologist: Ida Rogue, MD  Clinical pharmacists have reviewed the patient's past medical history, labs, and current medications as part of preoperative protocol coverage. The following recommendations have been made:  Patient with diagnosis of afib on Xarelto for anticoagulation.     Procedure: colonoscopy Date of procedure: 04/21/22   CHA2DS2-VASc Score = 2  This indicates a 2.2% annual risk of stroke. The patient's score is based upon: CHF History: 0 HTN History: 1 Diabetes History: 0 Stroke History: 0 Vascular Disease History: 0 Age Score: 1 Gender Score: 0   CrCl 5m/min Platelet count 229K   Per office protocol, patient can hold Xarelto for 1-2 days prior to procedure. Please resume Xarelto as soon as possible postprocedure, at the discretion of the surgeon.   I will route this recommendation to the requesting party via Epic fax function and remove from pre-op pool.  Please call with questions.  ELenna Sciara NP 02/06/2022, 3:44 PM

## 2022-02-12 ENCOUNTER — Telehealth: Payer: Self-pay

## 2022-02-12 NOTE — Telephone Encounter (Signed)
Blood thinner advice received from Dr. Donivan Scull office.  Patient's wife Neil Bush (listed on DPR) has been asked to let her husband know that he will need to stop Xarelto 2 days prior to colonoscopy.  She said she would let him know.  Thanks,  [place], [place]

## 2022-03-05 ENCOUNTER — Other Ambulatory Visit: Payer: Self-pay | Admitting: Family Medicine

## 2022-03-05 NOTE — Telephone Encounter (Signed)
Requested Prescriptions  Pending Prescriptions Disp Refills   XARELTO 20 MG TABS tablet [Pharmacy Med Name: XARELTO 20 MG TABLET] 90 tablet 0    Sig: Take 1 tablet (20 mg total) by mouth daily.     Hematology: Anticoagulants - rivaroxaban Failed - 03/05/2022  9:13 AM      Failed - Cr in normal range and within 360 days    Creatinine  Date Value Ref Range Status  10/30/2011 1.05 0.60 - 1.30 mg/dL Final   Creatinine, Ser  Date Value Ref Range Status  11/21/2021 1.37 (H) 0.76 - 1.27 mg/dL Final         Passed - ALT in normal range and within 360 days    ALT  Date Value Ref Range Status  11/21/2021 17 0 - 44 IU/L Final   ALT (SGPT) Piccolo, Waived  Date Value Ref Range Status  10/15/2018 24 10 - 47 U/L Final         Passed - AST in normal range and within 360 days    AST  Date Value Ref Range Status  11/21/2021 16 0 - 40 IU/L Final   AST (SGOT) Piccolo, Waived  Date Value Ref Range Status  10/15/2018 22 11 - 38 U/L Final         Passed - HCT in normal range and within 360 days    Hematocrit  Date Value Ref Range Status  10/17/2021 46.6 37.5 - 51.0 % Final         Passed - HGB in normal range and within 360 days    Hemoglobin  Date Value Ref Range Status  10/17/2021 16.0 13.0 - 17.7 g/dL Final         Passed - PLT in normal range and within 360 days    Platelets  Date Value Ref Range Status  10/17/2021 229 150 - 450 x10E3/uL Final         Passed - eGFR is 15 or above and within 360 days    EGFR (African American)  Date Value Ref Range Status  10/30/2011 >60  Final   GFR calc Af Amer  Date Value Ref Range Status  04/13/2020 69 >59 mL/min/1.73 Final    Comment:    **In accordance with recommendations from the NKF-ASN Task force,**   [facility] is in the process of updating its eGFR calculation to the   2021 CKD-EPI creatinine equation that estimates kidney function   without a race variable.    EGFR (Non-African Amer.)  Date Value Ref Range Status   10/30/2011 >60  Final    Comment:    eGFR values <48m/min/1.73 m2 may be an indication of chronic kidney disease (CKD). Calculated eGFR is useful in patients with stable renal function. The eGFR calculation will not be reliable in acutely ill patients when serum creatinine is changing rapidly. It is not useful in  patients on dialysis. The eGFR calculation may not be applicable to patients at the low and high extremes of body sizes, pregnant women, and vegetarians.    GFR, Estimated  Date Value Ref Range Status  04/08/2021 >60 >60 mL/min Final    Comment:    (NOTE) Calculated using the CKD-EPI Creatinine Equation (2021)    eGFR  Date Value Ref Range Status  11/21/2021 54 (L) >59 mL/min/1.73 Final         Passed - Patient is not pregnant      Passed - Valid encounter within last 12 months    Recent Outpatient  Visits           3 months ago Mixed hyperlipidemia   [place], [place], DO   4 months ago Mixed hyperlipidemia   [facility] [place], Megan P, DO   10 months ago Routine general medical examination at a health care facility   [place], [place], DO   1 year ago Mixed hyperlipidemia   [facility] [name], DO   1 year ago Routine general medical examination at a health care facility   [place], Barb Merino, DO       Future Appointments             In 1 month Wynetta Emery, Barb Merino, DO [facility]

## 2022-04-14 ENCOUNTER — Encounter: Payer: Self-pay | Admitting: Gastroenterology

## 2022-04-18 NOTE — Anesthesia Preprocedure Evaluation (Signed)
Anesthesia Evaluation  Patient identified by MRN, date of birth, ID band Patient awake    Reviewed: Allergy & Precautions, H&P , NPO status , Patient's Chart, lab work & pertinent test results  Airway Mallampati: II  TM Distance: >3 FB Neck ROM: full    Dental no notable dental hx.    Pulmonary neg pulmonary ROS   Pulmonary exam normal        Cardiovascular hypertension, Normal cardiovascular exam+ dysrhythmias (pAF) + Valvular Problems/Murmurs (Nonrheumatic aortic valve insufficiency)      Neuro/Psych  Headaches  negative psych ROS   GI/Hepatic negative GI ROS, Neg liver ROS,,,  Endo/Other  negative endocrine ROS    Renal/GU negative Renal ROS  negative genitourinary   Musculoskeletal  (+) Arthritis ,    Abdominal Normal abdominal exam  (+)   Peds  Hematology negative hematology ROS (+)   Anesthesia Other Findings Past Medical History: No date: Chest pain     Comment:  a. 01/2016 in setting of Afib;  b. 02/2016 Ex MV: EF               55-65%, no ischemia. No date: Dysrhythmia No date: Headache No date: Hyperlipidemia No date: Hypertension No date: PAF (paroxysmal atrial fibrillation) ([place])     Comment:  a. 01/2016 - admission for rapid AF and c/p-->converted               on IV dilt;  b. CHA2DS2VASc = 2-->Xarelto;  c. 01/2016               Echo: EF 55-60%, triv AI. No date: Palpitations     Comment:  a. 08/2016 Zio Patch: symptomatic PAC's and brief (max 11              beats) of SVT/PAT (3 episodes). No date: Skin cancer  Past Surgical History: No date: CARDIAC CATHETERIZATION     Comment:  [facility]  No date: CHOLECYSTECTOMY 04/17/2017: COLONOSCOPY WITH PROPOFOL; N/A     Comment:  Procedure: COLONOSCOPY WITH PROPOFOL;  Surgeon: Lucilla Lame, MD;  Location: [place];  Service:               Endoscopy;  Laterality: N/A; No date: EYE SURGERY No date: FOOT SURGERY No date: HERNIA  REPAIR     Comment:  X 2 ingunial No date: KNEE SURGERY; Right 04/17/2017: POLYPECTOMY     Comment:  Procedure: POLYPECTOMY INTESTINAL;  Surgeon: Lucilla Lame, MD;  Location: [place];  Service:               Endoscopy;;  BMI    Body Mass Index: 25.83 kg/m      Reproductive/Obstetrics negative OB ROS                             Anesthesia Physical Anesthesia Plan  ASA: 3  Anesthesia Plan: General   Post-op Pain Management: Minimal or no pain anticipated   Induction: Intravenous  PONV Risk Score and Plan: Propofol infusion and TIVA  Airway Management Planned: Natural Airway  Additional Equipment:   Intra-op Plan:   Post-operative Plan:   Informed Consent: I have reviewed the patients History and Physical, chart, labs and discussed the procedure including the risks, benefits and alternatives for the proposed anesthesia with  the patient or authorized representative who has indicated his/her understanding and acceptance.     Dental Advisory Given  Plan Discussed with: CRNA and Surgeon  Anesthesia Plan Comments:         Anesthesia Quick Evaluation

## 2022-04-21 ENCOUNTER — Ambulatory Visit
Admission: RE | Admit: 2022-04-21 | Discharge: 2022-04-21 | Disposition: A | Discharge status: Home / Self Care | Payer: PPO | Attending: Gastroenterology | Admitting: Gastroenterology

## 2022-04-21 ENCOUNTER — Other Ambulatory Visit: Payer: Self-pay

## 2022-04-21 ENCOUNTER — Encounter
Admission: RE | Disposition: A | Discharge status: Home / Self Care | Payer: Self-pay | Source: Home / Self Care | Attending: Gastroenterology

## 2022-04-21 ENCOUNTER — Ambulatory Visit: Payer: PPO | Admitting: Anesthesiology

## 2022-04-21 ENCOUNTER — Encounter: Payer: Self-pay | Admitting: Gastroenterology

## 2022-04-21 DIAGNOSIS — Z09 Encounter for follow-up examination after completed treatment for conditions other than malignant neoplasm: Secondary | ICD-10-CM | POA: Insufficient documentation

## 2022-04-21 DIAGNOSIS — D125 Benign neoplasm of sigmoid colon: Secondary | ICD-10-CM | POA: Insufficient documentation

## 2022-04-21 DIAGNOSIS — I480 Paroxysmal atrial fibrillation: Secondary | ICD-10-CM | POA: Insufficient documentation

## 2022-04-21 DIAGNOSIS — D124 Benign neoplasm of descending colon: Secondary | ICD-10-CM | POA: Diagnosis not present

## 2022-04-21 DIAGNOSIS — Z1211 Encounter for screening for malignant neoplasm of colon: Secondary | ICD-10-CM | POA: Insufficient documentation

## 2022-04-21 DIAGNOSIS — K641 Second degree hemorrhoids: Secondary | ICD-10-CM | POA: Diagnosis not present

## 2022-04-21 DIAGNOSIS — K635 Polyp of colon: Secondary | ICD-10-CM | POA: Diagnosis not present

## 2022-04-21 DIAGNOSIS — D126 Benign neoplasm of colon, unspecified: Secondary | ICD-10-CM | POA: Diagnosis not present

## 2022-04-21 DIAGNOSIS — D123 Benign neoplasm of transverse colon: Secondary | ICD-10-CM | POA: Diagnosis not present

## 2022-04-21 DIAGNOSIS — Z86010 Personal history of colonic polyps: Secondary | ICD-10-CM | POA: Diagnosis not present

## 2022-04-21 DIAGNOSIS — I10 Essential (primary) hypertension: Secondary | ICD-10-CM | POA: Diagnosis not present

## 2022-04-21 DIAGNOSIS — D122 Benign neoplasm of ascending colon: Secondary | ICD-10-CM | POA: Diagnosis not present

## 2022-04-21 DIAGNOSIS — E785 Hyperlipidemia, unspecified: Secondary | ICD-10-CM | POA: Diagnosis not present

## 2022-04-21 HISTORY — PX: POLYPECTOMY: SHX5525

## 2022-04-21 HISTORY — PX: COLONOSCOPY WITH PROPOFOL: SHX5780

## 2022-04-21 SURGERY — COLONOSCOPY WITH PROPOFOL
Anesthesia: General | Site: Rectum

## 2022-04-21 MED ORDER — PROPOFOL 10 MG/ML IV BOLUS
INTRAVENOUS | Status: DC | PRN
  Administered 2022-04-21: 100 mg via INTRAVENOUS

## 2022-04-21 MED ORDER — LACTATED RINGERS IV SOLN: INTRAVENOUS | Status: DC

## 2022-04-21 MED ORDER — STERILE WATER FOR IRRIGATION IR SOLN
Status: DC | PRN
  Administered 2022-04-21 (×2): 60 mL

## 2022-04-21 MED ORDER — STERILE WATER FOR IRRIGATION IR SOLN
Status: DC | PRN
  Administered 2022-04-21: 100 mL

## 2022-04-21 MED ORDER — SODIUM CHLORIDE 0.9 % IV SOLN: INTRAVENOUS | Status: DC

## 2022-04-21 MED ORDER — LIDOCAINE HCL (CARDIAC) PF 100 MG/5ML IV SOSY
PREFILLED_SYRINGE | INTRAVENOUS | Status: DC | PRN
  Administered 2022-04-21: 20 mg via INTRAVENOUS

## 2022-04-21 MED ORDER — PROPOFOL 500 MG/50ML IV EMUL
INTRAVENOUS | Status: DC | PRN
  Administered 2022-04-21: 120 ug/kg/min via INTRAVENOUS

## 2022-04-21 SURGICAL SUPPLY — 7 items
GOWN CVR UNV OPN BCK APRN NK (MISCELLANEOUS) ×4 IMPLANT
GOWN ISOL THUMB LOOP REG UNIV (MISCELLANEOUS) ×4
KIT PRC NS LF DISP ENDO (KITS) ×2 IMPLANT
KIT PROCEDURE OLYMPUS (KITS) ×2
MANIFOLD NEPTUNE II (INSTRUMENTS) ×2 IMPLANT
SNARE COLD EXACTO (MISCELLANEOUS) IMPLANT
TRAP ETRAP POLY (MISCELLANEOUS) IMPLANT

## 2022-04-21 NOTE — Transfer of Care (Signed)
Immediate Anesthesia Transfer of Care Note  Patient: Neil Bush  Procedure(s) Performed: COLONOSCOPY WITH BIOPSY (Rectum) POLYPECTOMY (Rectum)  Patient Location: PACU  Anesthesia Type: General  Level of Consciousness: awake, alert  and patient cooperative  Airway and Oxygen Therapy: Patient Spontanous Breathing and Patient connected to supplemental oxygen  Post-op Assessment: Post-op Vital signs reviewed, Patient's Cardiovascular Status Stable, Respiratory Function Stable, Patent Airway and No signs of Nausea or vomiting  Post-op Vital Signs: Reviewed and stable  Complications: No notable events documented.

## 2022-04-21 NOTE — Anesthesia Postprocedure Evaluation (Signed)
Anesthesia Post Note  Patient: Neil Bush  Procedure(s) Performed: COLONOSCOPY WITH BIOPSY (Rectum) POLYPECTOMY (Rectum)  Patient location during evaluation: PACU Anesthesia Type: General Level of consciousness: awake and alert Pain management: pain level controlled Vital Signs Assessment: post-procedure vital signs reviewed and stable Respiratory status: spontaneous breathing, nonlabored ventilation and respiratory function stable Cardiovascular status: blood pressure returned to baseline and stable Postop Assessment: no apparent nausea or vomiting Anesthetic complications: no   No notable events documented.   Last Vitals:  Vitals:   04/21/22 0824 04/21/22 0830  BP: 108/71 123/72  Pulse: 85 76  Resp: 16 19  Temp: (!) 36.4 C (!) 36.4 C  SpO2: 95% 94%    Last Pain:  Vitals:   04/21/22 0830  TempSrc:   PainSc: 0-No pain                 [place] Ouch

## 2022-04-21 NOTE — Op Note (Signed)
[facility] Gastroenterology Patient Name: Neil Bush Procedure Date: 04/21/2022 7:18 AM MRN: [id number] Account #: [id number] Date of Birth: [birth year] Admit Type: Outpatient Age: 72 Room: [facility] OR ROOM 01 Gender: Male Note Status: Finalized Instrument Name: [id number] Procedure:             Colonoscopy Indications:           High risk colon cancer surveillance: Personal history                         of colonic polyps Providers:             Lucilla Lame MD, MD Referring MD:          [name] (Referring MD) Medicines:             Propofol per Anesthesia Complications:         No immediate complications. Procedure:             Pre-Anesthesia Assessment:                        - Prior to the procedure, a History and Physical was                         performed, and patient medications and allergies were                         reviewed. The patient's tolerance of previous                         anesthesia was also reviewed. The risks and benefits                         of the procedure and the sedation options and risks                         were discussed with the patient. All questions were                         answered, and informed consent was obtained. Prior                         Anticoagulants: The patient has taken no anticoagulant                         or antiplatelet agents. ASA Grade Assessment: II - A                         patient with mild systemic disease. After reviewing                         the risks and benefits, the patient was deemed in                         satisfactory condition to undergo the procedure.                        After obtaining informed consent, the colonoscope was  passed under direct vision. Throughout the procedure,                         the patient's blood pressure, pulse, and oxygen                         saturations were monitored continuously. The                          Colonoscope was introduced through the anus and                         advanced to the the cecum, identified by appendiceal                         orifice and ileocecal valve. The colonoscopy was                         performed without difficulty. The patient tolerated                         the procedure well. The quality of the bowel                         preparation was excellent. Findings:      The perianal and digital rectal examinations were normal.      Three sessile polyps were found in the ascending colon. The polyps were       3 to 5 mm in size. These polyps were removed with a cold snare.       Resection and retrieval were complete.      Two sessile polyps were found in the transverse colon. The polyps were 3       to 6 mm in size. These polyps were removed with a cold snare. Resection       and retrieval were complete.      A 5 mm polyp was found in the descending colon. The polyp was sessile.       The polyp was removed with a cold snare. Resection and retrieval were       complete.      A 4 mm polyp was found in the sigmoid colon. The polyp was sessile. The       polyp was removed with a cold snare. Resection and retrieval were       complete.      Non-bleeding internal hemorrhoids were found during retroflexion. The       hemorrhoids were Grade II (internal hemorrhoids that prolapse but reduce       spontaneously). Impression:            - Three 3 to 5 mm polyps in the ascending colon,                         removed with a cold snare. Resected and retrieved.                        - Two 3 to 6 mm polyps in the transverse colon,                         removed with  a cold snare. Resected and retrieved.                        - One 5 mm polyp in the descending colon, removed with                         a cold snare. Resected and retrieved.                        - One 4 mm polyp in the sigmoid colon, removed with a                         cold snare. Resected and  retrieved.                        - Non-bleeding internal hemorrhoids. Recommendation:        - Discharge patient to home.                        - Resume previous diet.                        - Continue present medications.                        - Await pathology results.                        - Repeat colonoscopy in 3 years for surveillance. Procedure Code(s):     --- Professional ---                        [phone], Colonoscopy, flexible; with removal of                         tumor(s), polyp(s), or other lesion(s) by snare                         technique Diagnosis Code(s):     --- Professional ---                        Z86.010, Personal history of colonic polyps                        D12.2, Benign neoplasm of ascending colon CPT copyright 2022 American Medical Association. All rights reserved. The codes documented in this report are preliminary and upon coder review may  be revised to meet current compliance requirements. Lucilla Lame MD, MD 04/21/2022 8:21:58 AM This report has been signed electronically. Number of Addenda: 0 Note Initiated On: 04/21/2022 7:18 AM Scope Withdrawal Time: 0 hours 11 minutes 32 seconds  Total Procedure Duration: 0 hours 14 minutes 50 seconds  Estimated Blood Loss:  Estimated blood loss: none.      [facility]

## 2022-04-21 NOTE — H&P (Signed)
Lucilla Lame, MD [address] [place], [place] [address] Phone:[phone] Fax : [phone]  Primary Care Physician:  [name], DO Primary Gastroenterologist:  Dr. Allen Norris  Pre-Procedure History & Physical: HPI:  Neil Bush is a 72 y.o. male is here for an colonoscopy.   Past Medical History:  Diagnosis Date   Chest pain    a. 01/2016 in setting of Afib;  b. 02/2016 Ex MV: EF 55-65%, no ischemia.   Dysrhythmia    Headache    Hyperlipidemia    Hypertension    PAF (paroxysmal atrial fibrillation) ([place])    a. 01/2016 - admission for rapid AF and c/p-->converted on IV dilt;  b. CHA2DS2VASc = 2-->Xarelto;  c. 01/2016 Echo: EF 55-60%, triv AI.   Palpitations    a. 08/2016 Zio Patch: symptomatic PAC's and brief (max 11 beats) of SVT/PAT (3 episodes).   Skin cancer     Past Surgical History:  Procedure Laterality Date   CARDIAC CATHETERIZATION     [facility]    CHOLECYSTECTOMY     COLONOSCOPY WITH PROPOFOL N/A 04/17/2017   Procedure: COLONOSCOPY WITH PROPOFOL;  Surgeon: Lucilla Lame, MD;  Location: [place];  Service: Endoscopy;  Laterality: N/A;   EYE SURGERY     FOOT SURGERY     HERNIA REPAIR     X 2 ingunial   KNEE SURGERY Right    POLYPECTOMY  04/17/2017   Procedure: POLYPECTOMY INTESTINAL;  Surgeon: Lucilla Lame, MD;  Location: [place];  Service: Endoscopy;;    Prior to Admission medications   Medication Sig Start Date End Date Taking? Authorizing Provider  atorvastatin (LIPITOR) 10 MG tablet Take 1 tablet (10 mg total) by mouth daily. 11/21/21  Yes Johnson, Megan P, DO  flecainide (TAMBOCOR) 100 MG tablet Take 1 tablet (100 mg total) by mouth 2 (two) times daily as needed (daily PRN for atrial fibrillation). 06/15/20  Yes Minna Merritts, MD  lisinopril (ZESTRIL) 20 MG tablet Take 1 tablet (20 mg total) by mouth daily. 10/17/21  Yes Johnson, Megan P, DO  metoprolol succinate (TOPROL-XL) 50 MG 24 hr tablet Take 0.5 tablets (25 mg  total) by mouth daily. Take with or immediately following a meal. 08/26/21  Yes Gollan, Kathlene November, MD  XARELTO 20 MG TABS tablet Take 1 tablet (20 mg total) by mouth daily. 03/05/22  Yes Johnson, Megan P, DO    Allergies as of 02/06/2022 - Review Complete 11/21/2021  Allergen Reaction Noted   Atorvastatin Other (See Comments) 04/18/2021   Penicillins Rash 10/09/2014    Family History  Problem Relation Age of Onset   Cancer Mother    Heart disease Father    Diabetes Father    Hypertension Father    Heart disease Brother    COPD Neg Hx    Stroke Neg Hx     Social History   Socioeconomic History   Marital status: Married    Spouse name: Not on file   Number of children: Not on file   Years of education: Not on file   Highest education level: Not on file  Occupational History   Occupation: partime truck driver  Tobacco Use   Smoking status: Never   Smokeless tobacco: Never  Vaping Use   Vaping Use: Never used  Substance and Sexual Activity   Alcohol use: No    Alcohol/week: 0.0 standard drinks of alcohol   Drug use: No   Sexual activity: Yes  Other Topics Concern  Not on file  Social History Narrative   Not on file   Social Determinants of Health   Financial Resource Strain: Low Risk  (05/30/2021)   Overall Financial Resource Strain (CARDIA)    Difficulty of Paying Living Expenses: Not hard at all  Food Insecurity: No Food Insecurity (05/30/2021)   Hunger Vital Sign    Worried About Running Out of Food in the Last Year: Never true    Ran Out of Food in the Last Year: Never true  Transportation Needs: No Transportation Needs (05/30/2021)   PRAPARE - Hydrologist (Medical): No    Lack of Transportation (Non-Medical): No  Physical Activity: Insufficiently Active (05/30/2021)   Exercise Vital Sign    Days of Exercise per Week: 3 days    Minutes of Exercise per Session: 40 min  Stress: No Stress Concern Present (05/30/2021)   [place]    Feeling of Stress : Not at all  Social Connections: Moderately Integrated (05/30/2021)   Social Connection and Isolation Panel [NHANES]    Frequency of Communication with Friends and Family: Three times a week    Frequency of Social Gatherings with Friends and Family: Once a week    Attends Religious Services: More than 4 times per year    Active Member of [employer] or Organizations: No    Attends Archivist Meetings: Never    Marital Status: Married  Human resources officer Violence: Not At Risk (05/30/2021)   Humiliation, Afraid, Rape, and Kick questionnaire    Fear of Current or Ex-Partner: No    Emotionally Abused: No    Physically Abused: No    Sexually Abused: No    Review of Systems: See HPI, otherwise negative ROS  Physical Exam: BP (!) 136/96   Pulse 90   Temp 98.5 F (36.9 C) (Temporal)   Resp (!) 21   Ht 5' 10"$  (1.778 m)   Wt 79.4 kg   SpO2 96%   BMI 25.11 kg/m  General:   Alert,  pleasant and cooperative in NAD Head:  Normocephalic and atraumatic. Neck:  Supple; no masses or thyromegaly. Lungs:  Clear throughout to auscultation.    Heart:  Regular rate and rhythm. Abdomen:  Soft, nontender and nondistended. Normal bowel sounds, without guarding, and without rebound.   Neurologic:  Alert and  oriented x4;  grossly normal neurologically.  Impression/Plan: Neil Bush is here for an colonoscopy to be performed for a history of adenomatous polyps on 2019   Risks, benefits, limitations, and alternatives regarding  colonoscopy have been reviewed with the patient.  Questions have been answered.  All parties agreeable.   Lucilla Lame, MD  04/21/2022, 7:50 AM

## 2022-04-22 ENCOUNTER — Encounter: Payer: Self-pay | Admitting: Gastroenterology

## 2022-04-23 ENCOUNTER — Encounter: Payer: Self-pay | Admitting: Gastroenterology

## 2022-04-23 LAB — SURGICAL PATHOLOGY

## 2022-04-24 ENCOUNTER — Encounter: Payer: Self-pay | Admitting: Family Medicine

## 2022-04-24 ENCOUNTER — Ambulatory Visit (INDEPENDENT_AMBULATORY_CARE_PROVIDER_SITE_OTHER): Payer: PPO | Admitting: Family Medicine

## 2022-04-24 VITALS — BP 135/80 | HR 59 | Temp 98.2°F | Ht 70.0 in | Wt 183.3 lb

## 2022-04-24 DIAGNOSIS — I480 Paroxysmal atrial fibrillation: Secondary | ICD-10-CM | POA: Diagnosis not present

## 2022-04-24 DIAGNOSIS — E782 Mixed hyperlipidemia: Secondary | ICD-10-CM

## 2022-04-24 DIAGNOSIS — R3911 Hesitancy of micturition: Secondary | ICD-10-CM | POA: Diagnosis not present

## 2022-04-24 DIAGNOSIS — N400 Enlarged prostate without lower urinary tract symptoms: Secondary | ICD-10-CM

## 2022-04-24 DIAGNOSIS — Z0000 Encounter for general adult medical examination without abnormal findings: Secondary | ICD-10-CM

## 2022-04-24 DIAGNOSIS — I10 Essential (primary) hypertension: Secondary | ICD-10-CM

## 2022-04-24 LAB — URINALYSIS, ROUTINE W REFLEX MICROSCOPIC
Bilirubin, UA: NEGATIVE
Glucose, UA: NEGATIVE
Ketones, UA: NEGATIVE
Leukocytes,UA: NEGATIVE
Nitrite, UA: NEGATIVE
Protein,UA: NEGATIVE
Specific Gravity, UA: 1.02 (ref 1.005–1.030)
Urobilinogen, Ur: 0.2 mg/dL (ref 0.2–1.0)
pH, UA: 5 (ref 5.0–7.5)

## 2022-04-24 LAB — MICROSCOPIC EXAMINATION
Bacteria, UA: NONE SEEN
Epithelial Cells (non renal): NONE SEEN /hpf (ref 0–10)
WBC, UA: NONE SEEN /hpf (ref 0–5)

## 2022-04-24 LAB — MICROALBUMIN, URINE WAIVED
Creatinine, Urine Waived: 200 mg/dL (ref 10–300)
Microalb, Ur Waived: 30 mg/L — ABNORMAL HIGH (ref 0–19)
Microalb/Creat Ratio: <30 mg/g (ref <30)

## 2022-04-24 MED ORDER — LISINOPRIL 20 MG PO TABS: 20.0000 mg | ORAL_TABLET | Freq: Every day | ORAL | 1 refills | Status: DC

## 2022-04-24 MED ORDER — ATORVASTATIN CALCIUM 10 MG PO TABS: 10.0000 mg | ORAL_TABLET | Freq: Every day | ORAL | 1 refills | Status: DC

## 2022-04-24 MED ORDER — RIVAROXABAN 20 MG PO TABS: 20.0000 mg | ORAL_TABLET | Freq: Every day | ORAL | 1 refills | Status: DC

## 2022-04-24 MED ORDER — METOPROLOL SUCCINATE ER 50 MG PO TB24: 25.0000 mg | ORAL_TABLET | Freq: Every day | ORAL | 1 refills | Status: DC

## 2022-04-24 NOTE — Assessment & Plan Note (Signed)
Under good control on current regimen. Continue current regimen. Continue to monitor. Call with any concerns. Refills given. Labs drawn today.   

## 2022-04-24 NOTE — Progress Notes (Signed)
BP 135/80   Pulse (!) 59   Temp 98.2 F (36.8 C)   Ht 5' 10"$  (1.778 m)   Wt 183 lb 4.8 oz (83.1 kg)   SpO2 95%   BMI 26.30 kg/m    Subjective:    Patient ID: Neil Bush, male    DOB: November 27, 1950, 72 y.o.   MRN: [id number]  HPI: Neil Bush is a 72 y.o. male presenting on 04/24/2022 for comprehensive medical examination. Current medical complaints include:  HYPERTENSION / HYPERLIPIDEMIA Satisfied with current treatment? yes Duration of hypertension: chronic BP monitoring frequency: not checking BP medication side effects: no Past BP meds: lisinopril, metoprolol Duration of hyperlipidemia: chronic Cholesterol medication side effects: no Cholesterol supplements: none Past cholesterol medications: atorvastatin Medication compliance: excellent compliance Aspirin: no Recent stressors: no Recurrent headaches: no Visual changes: no Palpitations: no Dyspnea: no Chest pain: no Lower extremity edema: no Dizzy/lightheaded: no  BPH BPH status: controlled Satisfied with current treatment?: yes Medication side effects: no Medication compliance: excellent compliance Duration: chronic Nocturia: 1-2x per night Urinary frequency:yes Incomplete voiding: no Urgency: no Weak urinary stream: no Straining to start stream: no Dysuria: no Onset: gradual Severity: mild  Interim Problems from his last visit: no  Depression Screen done today and results listed below:     04/24/2022    8:29 AM 11/21/2021    8:44 AM 10/17/2021    8:29 AM 05/30/2021    9:01 AM 05/04/2020    9:03 AM  Depression screen PHQ 2/9  Decreased Interest 0 0 0 0 0  Down, Depressed, Hopeless 0 0 0 0 0  PHQ - 2 Score 0 0 0 0 0  Altered sleeping 0 0 0    Tired, decreased energy 0 0 1    Change in appetite 0 0 0    Feeling bad or failure about yourself  0 0 0    Trouble concentrating 0 0 0    Moving slowly or fidgety/restless 0 0 0    Suicidal thoughts 0 0 0    PHQ-9 Score 0 0 1    Difficult doing  work/chores Not difficult at all        Past Medical History:  Past Medical History:  Diagnosis Date   Chest pain    a. 01/2016 in setting of Afib;  b. 02/2016 Ex MV: EF 55-65%, no ischemia.   Dysrhythmia    Headache    Hyperlipidemia    Hypertension    PAF (paroxysmal atrial fibrillation) ([facility])    a. 01/2016 - admission for rapid AF and c/p-->converted on IV dilt;  b. CHA2DS2VASc = 2-->Xarelto;  c. 01/2016 Echo: EF 55-60%, triv AI.   Palpitations    a. 08/2016 Zio Patch: symptomatic PAC's and brief (max 11 beats) of SVT/PAT (3 episodes).   Skin cancer     Surgical History:  Past Surgical History:  Procedure Laterality Date   CARDIAC CATHETERIZATION     [facility]    CHOLECYSTECTOMY     COLONOSCOPY WITH PROPOFOL N/A 04/17/2017   Procedure: COLONOSCOPY WITH PROPOFOL;  Surgeon: Lucilla Lame, MD;  Location: [facility];  Service: Endoscopy;  Laterality: N/A;   COLONOSCOPY WITH PROPOFOL N/A 04/21/2022   Procedure: COLONOSCOPY WITH BIOPSY;  Surgeon: Lucilla Lame, MD;  Location: [facility];  Service: Endoscopy;  Laterality: N/A;   EYE SURGERY     FOOT SURGERY     HERNIA REPAIR     X 2 ingunial   KNEE SURGERY Right  POLYPECTOMY  04/17/2017   Procedure: POLYPECTOMY INTESTINAL;  Surgeon: Lucilla Lame, MD;  Location: [facility];  Service: Endoscopy;;   POLYPECTOMY  04/21/2022   Procedure: POLYPECTOMY;  Surgeon: Lucilla Lame, MD;  Location: [facility];  Service: Endoscopy;;    Medications:  Current Outpatient Medications on File Prior to Visit  Medication Sig   flecainide (TAMBOCOR) 100 MG tablet Take 1 tablet (100 mg total) by mouth 2 (two) times daily as needed (daily PRN for atrial fibrillation).   Current Facility-Administered Medications on File Prior to Visit  Medication   triamcinolone acetonide (KENALOG-40) injection 20 mg    Allergies:  Allergies  Allergen Reactions   Penicillins Rash    Has patient had a PCN reaction causing immediate  rash, facial/tongue/throat swelling, SOB or lightheadedness with hypotension: no Has patient had a PCN reaction causing severe rash involving mucus membranes or skin necrosis: no Has patient had a PCN reaction that required hospitalization no Has patient had a PCN reaction occurring within the last 10 years: no If all of the above answers are "NO", then may proceed with Cephalosporin use.    Childhood allergy    Social History:  Social History   Socioeconomic History   Marital status: Married    Spouse name: Not on file   Number of children: Not on file   Years of education: Not on file   Highest education level: Not on file  Occupational History   Occupation: partime truck driver  Tobacco Use   Smoking status: Never   Smokeless tobacco: Never  Vaping Use   Vaping Use: Never used  Substance and Sexual Activity   Alcohol use: No    Alcohol/week: 0.0 standard drinks of alcohol   Drug use: No   Sexual activity: Yes  Other Topics Concern   Not on file  Social History Narrative   Not on file   Social Determinants of Health   Financial Resource Strain: Low Risk  (05/30/2021)   Overall Financial Resource Strain (CARDIA)    Difficulty of Paying Living Expenses: Not hard at all  Food Insecurity: No Food Insecurity (05/30/2021)   Hunger Vital Sign    Worried About Running Out of Food in the Last Year: Never true    [place] in the Last Year: Never true  Transportation Needs: No Transportation Needs (05/30/2021)   PRAPARE - Hydrologist (Medical): No    Lack of Transportation (Non-Medical): No  Physical Activity: Insufficiently Active (05/30/2021)   Exercise Vital Sign    Days of Exercise per Week: 3 days    Minutes of Exercise per Session: 40 min  Stress: No Stress Concern Present (05/30/2021)   [place]    Feeling of Stress : Not at all  Social Connections: Moderately  Integrated (05/30/2021)   Social Connection and Isolation Panel [NHANES]    Frequency of Communication with Friends and Family: Three times a week    Frequency of Social Gatherings with Friends and Family: Once a week    Attends Religious Services: More than 4 times per year    Active Member of [employer] or Organizations: No    Attends Archivist Meetings: Never    Marital Status: Married  Human resources officer Violence: Not At Risk (05/30/2021)   Humiliation, Afraid, Rape, and Kick questionnaire    Fear of Current or Ex-Partner: No    Emotionally Abused: No    Physically  Abused: No    Sexually Abused: No   Social History   Tobacco Use  Smoking Status Never  Smokeless Tobacco Never   Social History   Substance and Sexual Activity  Alcohol Use No   Alcohol/week: 0.0 standard drinks of alcohol    Family History:  Family History  Problem Relation Age of Onset   Cancer Mother    Heart disease Father    Diabetes Father    Hypertension Father    Heart disease Brother    COPD Neg Hx    Stroke Neg Hx     Past medical history, surgical history, medications, allergies, family history and social history reviewed with patient today and changes made to appropriate areas of the chart.   Review of Systems  Constitutional: Negative.   HENT: Negative.    Eyes: Negative.   Respiratory: Negative.    Cardiovascular:  Positive for palpitations. Negative for chest pain, orthopnea, claudication, leg swelling and PND.  Gastrointestinal: Negative.   Genitourinary: Negative.   Musculoskeletal: Negative.   Skin: Negative.   Neurological: Negative.   Endo/Heme/Allergies:  Negative for environmental allergies and polydipsia. Bruises/bleeds easily.  Psychiatric/Behavioral: Negative.     All other ROS negative except what is listed above and in the HPI.      Objective:    BP 135/80   Pulse (!) 59   Temp 98.2 F (36.8 C)   Ht 5' 10"$  (1.778 m)   Wt 183 lb 4.8 oz (83.1 kg)   SpO2 95%    BMI 26.30 kg/m   Wt Readings from Last 3 Encounters:  04/24/22 183 lb 4.8 oz (83.1 kg)  04/21/22 175 lb (79.4 kg)  11/21/21 182 lb 3.2 oz (82.6 kg)    Physical Exam Vitals and nursing note reviewed.  Constitutional:      General: He is not in acute distress.    Appearance: Normal appearance. He is not ill-appearing, toxic-appearing or diaphoretic.  HENT:     Head: Normocephalic and atraumatic.     Right Ear: Tympanic membrane, ear canal and external ear normal. There is no impacted cerumen.     Left Ear: Tympanic membrane, ear canal and external ear normal. There is no impacted cerumen.     Nose: Nose normal. No congestion or rhinorrhea.     Mouth/Throat:     Mouth: Mucous membranes are moist.     Pharynx: Oropharynx is clear. No oropharyngeal exudate or posterior oropharyngeal erythema.  Eyes:     General: No scleral icterus.       Right eye: No discharge.        Left eye: No discharge.     Extraocular Movements: Extraocular movements intact.     Conjunctiva/sclera: Conjunctivae normal.     Pupils: Pupils are equal, round, and reactive to light.  Neck:     Vascular: No carotid bruit.  Cardiovascular:     Rate and Rhythm: Normal rate and regular rhythm.     Pulses: Normal pulses.     Heart sounds: No murmur heard.    No friction rub. No gallop.  Pulmonary:     Effort: Pulmonary effort is normal. No respiratory distress.     Breath sounds: Normal breath sounds. No stridor. No wheezing, rhonchi or rales.  Chest:     Chest wall: No tenderness.  Abdominal:     General: Abdomen is flat. Bowel sounds are normal. There is no distension.     Palpations: Abdomen is soft. There is no mass.  Tenderness: There is no abdominal tenderness. There is no right CVA tenderness, left CVA tenderness, guarding or rebound.     Hernia: No hernia is present.  Genitourinary:    Comments: Genital exam deferred with shared decision making Musculoskeletal:        General: No swelling,  tenderness, deformity or signs of injury.     Cervical back: Normal range of motion and neck supple. No rigidity. No muscular tenderness.     Right lower leg: No edema.     Left lower leg: No edema.  Lymphadenopathy:     Cervical: No cervical adenopathy.  Skin:    General: Skin is warm and dry.     Capillary Refill: Capillary refill takes less than 2 seconds.     Coloration: Skin is not jaundiced or pale.     Findings: No bruising, erythema, lesion or rash.  Neurological:     General: No focal deficit present.     Mental Status: He is alert and oriented to person, place, and time.     Cranial Nerves: No cranial nerve deficit.     Sensory: No sensory deficit.     Motor: No weakness.     Coordination: Coordination normal.     Gait: Gait normal.     Deep Tendon Reflexes: Reflexes normal.  Psychiatric:        Mood and Affect: Mood normal.        Behavior: Behavior normal.        Thought Content: Thought content normal.        Judgment: Judgment normal.     Results for orders placed or performed during the hospital encounter of 04/21/22  Surgical pathology  Result Value Ref Range   SURGICAL PATHOLOGY      SURGICAL PATHOLOGY CASE: [id number] PATIENT: [facility] Report     Specimen Submitted: A. Colon polyp x3, ascending; cold snare B. Colon polyp x2, transverse; cold snare C. Colon polyp, descending; cold snare D. Colon polyp, sigmoid; cold snare  Clinical History: History of colon polyps    DIAGNOSIS: A. COLON POLYPS X 3, ASCENDING; COLD SNARE: - FRAGMENTS (X 3) OF TUBULAR ADENOMAS. - NEGATIVE FOR HIGH-GRADE DYSPLASIA AND MALIGNANCY.  B. COLON POLYPS X 2, TRANSVERSE; COLD SNARE: - SINGLE FRAGMENT OF TUBULAR ADENOMA. - FECAL MATERIAL. - NEGATIVE FOR HIGH-GRADE DYSPLASIA AND MALIGNANCY.  C. COLON POLYP, DESCENDING; COLD SNARE: - TUBULAR ADENOMA. - NEGATIVE FOR HIGH-GRADE DYSPLASIA AND MALIGNANCY.  D. COLON POLYP, SIGMOID; COLD SNARE: -  TUBULAR ADENOMA. - NEGATIVE FOR HIGH-GRADE DYSPLASIA AND MALIGNANCY.  GROSS DESCRIPTION: A. Labeled: Ascending colon polyp x 3-cold snare Received: Formalin Collection time: 8:10 AM on 04/21/2022  Placed into formalin time: 8:10 AM on 04/21/2022 Tissue fragment(s): 3 Size: Aggregate, 0.9 x 0.3 x 0.1 cm Description: Tan soft tissue fragments Entirely submitted in 1 cassette.  B. Labeled: Transverse colon polyp x 2-cold snare Received: Formalin Collection time: 8:14 AM on 04/21/2022 Placed into formalin time: 8:14 AM on 04/21/2022 Tissue fragment(s): Multiple Size: Aggregate, 2.5 x 0.7 x 0.1 cm Description: Received is at least 1 tan soft tissue fragment, admixed with intestinal debris.  A second tissue fragment is not definitively identified Entirely submitted in 1 cassette.  C. Labeled: Descending colon polyp x 1-cold snare Received: Formalin Collection time: 8:16 AM on 04/21/2022 Placed into formalin time: 8:16 AM on 04/21/2022 Tissue fragment(s): Multiple Size: Aggregate, 0.5 x 0.5 x 0.1 cm Description: Received is a tan soft tissue fragment, admixed with intestinal debris.  The ratio of soft tissue to intestinal debris is 90: 10. Entirely submitted in 1  cassette.  D. Labeled: Sigmoid colon polyp x 1-cold snare Received: Formalin Collection time: 8:18 AM on 04/21/2022 Placed into formalin time: 8:18 AM on 04/21/2022 Tissue fragment(s): Multiple Size: Aggregate, 0.5 x 0.3 x 0.1 cm Description: Received is a tan soft tissue fragment, admixed with intestinal debris.  The ratio of soft tissue to intestinal debris is 90: 10. Entirely submitted in 1 cassette.  CM 04/22/2022  Final Diagnosis performed by Allena Napoleon, MD.   Electronically signed 04/23/2022 12:45:28PM The electronic signature indicates that the named Attending Pathologist has evaluated the specimen Technical component performed at [facility], [address] [place], [place] [address] Lab: [phone] Dir: Rush Farmer, MD, MMM  Professional component performed at [facility], [facility], [place], [place], [place] [address] Lab: [phone] Dir: Kathi Simpers, MD       Assessment & Plan:   Problem List Items Addressed This Visit       Cardiovascular and Mediastinum   Essential hypertension    Under good control on current regimen. Continue current regimen. Continue to monitor. Call with any concerns. Refills given. Labs drawn today.        Relevant Medications   atorvastatin (LIPITOR) 10 MG tablet   lisinopril (ZESTRIL) 20 MG tablet   metoprolol succinate (TOPROL-XL) 50 MG 24 hr tablet   rivaroxaban (XARELTO) 20 MG TABS tablet   Other Relevant Orders   Comprehensive metabolic panel   CBC with Differential/Platelet   Lipid Panel w/o Chol/HDL Ratio   TSH   Urinalysis, Routine w reflex microscopic   Microalbumin, Urine Waived   Paroxysmal atrial fibrillation (HCC)    Stable. Continue to follow with cardiology. Call with any concerns. Refills given.       Relevant Medications   atorvastatin (LIPITOR) 10 MG tablet   lisinopril (ZESTRIL) 20 MG tablet   metoprolol succinate (TOPROL-XL) 50 MG 24 hr tablet   rivaroxaban (XARELTO) 20 MG TABS tablet     Genitourinary   BPH (benign prostatic hyperplasia)    Under good control on current regimen. Continue current regimen. Continue to monitor. Call with any concerns. Refills given. Labs drawn today.       Relevant Orders   PSA     Other   Hyperlipidemia    Under good control on current regimen. Continue current regimen. Continue to monitor. Call with any concerns. Refills given. Labs drawn today.       Relevant Medications   atorvastatin (LIPITOR) 10 MG tablet   lisinopril (ZESTRIL) 20 MG tablet   metoprolol succinate (TOPROL-XL) 50 MG 24 hr tablet   rivaroxaban (XARELTO) 20 MG TABS tablet   Other Relevant Orders   Lipid Panel w/o Chol/HDL Ratio   Other Visit Diagnoses     Routine general medical  examination at a health care facility    -  Primary   Vaccines up to date. Screening labs checked today. Colonoscopy up to date. Continue diet and exercise. Call with any concerns.        Discussed aspirin prophylaxis for myocardial infarction prevention and decision was it was not indicated  LABORATORY TESTING:  Health maintenance labs ordered today as discussed above.   The natural history of prostate cancer and ongoing controversy regarding screening and potential treatment outcomes of prostate cancer has been discussed with the patient. The meaning of a false positive PSA and a false negative PSA has been discussed. He  indicates understanding of the limitations of this screening test and wishes to proceed with screening PSA testing.   IMMUNIZATIONS:   - Tdap: Tetanus vaccination status reviewed: last tetanus booster within 10 years. - Influenza: Up to date - Pneumovax: Up to date - Prevnar: Up to date - COVID: Up to date - HPV: Not applicable - Shingrix vaccine: Given elsewhere  SCREENING: - Colonoscopy: Up to date  Discussed with patient purpose of the colonoscopy is to detect colon cancer at curable precancerous or early stages    PATIENT COUNSELING:    Sexuality: Discussed sexually transmitted diseases, partner selection, use of condoms, avoidance of unintended pregnancy  and contraceptive alternatives.   Advised to avoid cigarette smoking.  I discussed with the patient that most people either abstain from alcohol or drink within safe limits (<=14/week and <=4 drinks/occasion for males, <=7/weeks and <= 3 drinks/occasion for females) and that the risk for alcohol disorders and other health effects rises proportionally with the number of drinks per week and how often a drinker exceeds daily limits.  Discussed cessation/primary prevention of drug use and availability of treatment for abuse.   Diet: Encouraged to adjust caloric intake to maintain  or achieve ideal body  weight, to reduce intake of dietary saturated fat and total fat, to limit sodium intake by avoiding high sodium foods and not adding table salt, and to maintain adequate dietary potassium and calcium preferably from fresh fruits, vegetables, and low-fat dairy products.    stressed the importance of regular exercise  Injury prevention: Discussed safety belts, safety helmets, smoke detector, smoking near bedding or upholstery.   Dental health: Discussed importance of regular tooth brushing, flossing, and dental visits.   Follow up plan: NEXT PREVENTATIVE PHYSICAL DUE IN 1 YEAR. Return in about 6 months (around 10/23/2022).

## 2022-04-24 NOTE — Assessment & Plan Note (Signed)
Stable. Continue to follow with cardiology. Call with any concerns. Refills given.

## 2022-04-26 LAB — LIPID PANEL W/O CHOL/HDL RATIO
Cholesterol, Total: 176 mg/dL (ref 100–199)
HDL: 39 mg/dL — ABNORMAL LOW (ref >39)
LDL Chol Calc (NIH): 109 mg/dL — ABNORMAL HIGH (ref 0–99)
Triglycerides: 159 mg/dL — ABNORMAL HIGH (ref 0–149)
VLDL Cholesterol Cal: 28 mg/dL (ref 5–40)

## 2022-04-26 LAB — COMPREHENSIVE METABOLIC PANEL
ALT: 18 IU/L (ref 0–44)
AST: 18 IU/L (ref 0–40)
Albumin/Globulin Ratio: 1.8 (ref 1.2–2.2)
Albumin: 4.2 g/dL (ref 3.8–4.8)
Alkaline Phosphatase: 81 IU/L (ref 44–121)
BUN/Creatinine Ratio: 19 (ref 10–24)
BUN: 24 mg/dL (ref 8–27)
Bilirubin Total: 0.5 mg/dL (ref 0.0–1.2)
CO2: 22 mmol/L (ref 20–29)
Calcium: 9.2 mg/dL (ref 8.6–10.2)
Chloride: 104 mmol/L (ref 96–106)
Creatinine, Ser: 1.27 mg/dL (ref 0.76–1.27)
Globulin, Total: 2.3 g/dL (ref 1.5–4.5)
Glucose: 98 mg/dL (ref 70–99)
Potassium: 5.3 mmol/L — ABNORMAL HIGH (ref 3.5–5.2)
Sodium: 140 mmol/L (ref 134–144)
Total Protein: 6.5 g/dL (ref 6.0–8.5)
eGFR: 60 mL/min/{1.73_m2} (ref >59)

## 2022-04-26 LAB — CBC WITH DIFFERENTIAL/PLATELET
Basophils Absolute: 0.1 10*3/uL (ref 0.0–0.2)
Basos: 1 %
EOS (ABSOLUTE): 0.1 10*3/uL (ref 0.0–0.4)
Eos: 2 %
Hematocrit: 46.7 % (ref 37.5–51.0)
Hemoglobin: 15.9 g/dL (ref 13.0–17.7)
Immature Grans (Abs): 0 10*3/uL (ref 0.0–0.1)
Immature Granulocytes: 0 %
Lymphocytes Absolute: 1.6 10*3/uL (ref 0.7–3.1)
Lymphs: 30 %
MCH: 32.6 pg (ref 26.6–33.0)
MCHC: 34 g/dL (ref 31.5–35.7)
MCV: 96 fL (ref 79–97)
Monocytes Absolute: 0.4 10*3/uL (ref 0.1–0.9)
Monocytes: 8 %
Neutrophils Absolute: 3.2 10*3/uL (ref 1.4–7.0)
Neutrophils: 59 %
Platelets: 250 10*3/uL (ref 150–450)
RBC: 4.88 x10E6/uL (ref 4.14–5.80)
RDW: 12.6 % (ref 11.6–15.4)
WBC: 5.4 10*3/uL (ref 3.4–10.8)

## 2022-04-26 LAB — PSA: Prostate Specific Ag, Serum: 0.8 ng/mL (ref 0.0–4.0)

## 2022-04-26 LAB — TSH: TSH: 1.81 u[IU]/mL (ref 0.450–4.500)

## 2022-04-28 ENCOUNTER — Encounter: Payer: Self-pay | Admitting: Family Medicine

## 2022-05-08 ENCOUNTER — Telehealth: Payer: Self-pay | Admitting: Family Medicine

## 2022-05-08 NOTE — Telephone Encounter (Signed)
Copied from [place] [id number]. Topic: Medicare AWV >> May 08, 2022  2:10 PM Devoria Glassing wrote: Reason for CRM: Called patient to schedule Medicare Annual Wellness Visit (AWV). Left message for patient to call back and schedule Medicare Annual Wellness Visit (AWV).  Last date of AWV: 05/30/2021  Please schedule an appointment at any time with Kirke Shaggy, NHA  .  If any questions, please contact me.  Thank you ,  Sherol Dade; [place] Direct Dial: [phone]

## 2022-06-05 ENCOUNTER — Other Ambulatory Visit: Payer: Self-pay | Admitting: Family Medicine

## 2022-06-05 NOTE — Telephone Encounter (Signed)
Unable to refill per protocol, Rx request is too soon. Last refill 04/24/22 for 90 and 1 refill.  Requested Prescriptions  Pending Prescriptions Disp Refills   XARELTO 20 MG TABS tablet [Pharmacy Med Name: XARELTO 20MG  TABLETS] 90 tablet 1    Sig: TAKE 1 TABLET BY MOUTH DAILY     Hematology: Anticoagulants - rivaroxaban Passed - 06/05/2022  3:10 AM      Passed - ALT in normal range and within 360 days    ALT  Date Value Ref Range Status  04/24/2022 18 0 - 44 IU/L Final   ALT (SGPT) Piccolo, Waived  Date Value Ref Range Status  10/15/2018 24 10 - 47 U/L Final         Passed - AST in normal range and within 360 days    AST  Date Value Ref Range Status  04/24/2022 18 0 - 40 IU/L Final   AST (SGOT) Piccolo, Waived  Date Value Ref Range Status  10/15/2018 22 11 - 38 U/L Final         Passed - Cr in normal range and within 360 days    Creatinine  Date Value Ref Range Status  10/30/2011 1.05 0.60 - 1.30 mg/dL Final   Creatinine, Ser  Date Value Ref Range Status  04/24/2022 1.27 0.76 - 1.27 mg/dL Final         Passed - HCT in normal range and within 360 days    Hematocrit  Date Value Ref Range Status  04/24/2022 46.7 37.5 - 51.0 % Final         Passed - HGB in normal range and within 360 days    Hemoglobin  Date Value Ref Range Status  04/24/2022 15.9 13.0 - 17.7 g/dL Final         Passed - PLT in normal range and within 360 days    Platelets  Date Value Ref Range Status  04/24/2022 250 150 - 450 x10E3/uL Final         Passed - eGFR is 15 or above and within 360 days    EGFR (African American)  Date Value Ref Range Status  10/30/2011 >60  Final   GFR calc Af Amer  Date Value Ref Range Status  04/13/2020 69 >59 mL/min/1.73 Final    Comment:    **In accordance with recommendations from the NKF-ASN Task force,**   [facility] is in the process of updating its eGFR calculation to the   2021 CKD-EPI creatinine equation that estimates kidney function   without a race  variable.    EGFR (Non-African Amer.)  Date Value Ref Range Status  10/30/2011 >60  Final    Comment:    eGFR values <80mL/min/1.73 m2 may be an indication of chronic kidney disease (CKD). Calculated eGFR is useful in patients with stable renal function. The eGFR calculation will not be reliable in acutely ill patients when serum creatinine is changing rapidly. It is not useful in  patients on dialysis. The eGFR calculation may not be applicable to patients at the low and high extremes of body sizes, pregnant women, and vegetarians.    GFR, Estimated  Date Value Ref Range Status  04/08/2021 >60 >60 mL/min Final    Comment:    (NOTE) Calculated using the CKD-EPI Creatinine Equation (2021)    eGFR  Date Value Ref Range Status  04/24/2022 60 >59 mL/min/1.73 Final         Passed - Patient is not pregnant  Passed - Valid encounter within last 12 months    Recent Outpatient Visits           1 month ago Routine general medical examination at a health care facility   [place], [place] P, DO   6 months ago Mixed hyperlipidemia   [place], [place], DO   7 months ago Mixed hyperlipidemia   [place], [place], DO   1 year ago Routine general medical examination at a health care facility   [place], [place], DO   1 year ago Mixed hyperlipidemia   [place], [place], DO       Future Appointments             In 3 months Goll[place], Kathlene November, MD [place] at [place]   In 4 months [name], [place], [place]

## 2022-06-17 ENCOUNTER — Telehealth: Payer: Self-pay | Admitting: Family Medicine

## 2022-06-17 NOTE — Telephone Encounter (Signed)
Contacted Neil Bush to schedule their annual wellness visit. Call back at later date: 06/18/22 . Spouse stated to call back 06/18/22  Verlee Rossetti; Care Guide Ambulatory Clinical Support [place] [facility] Direct Dial: [phone]

## 2022-06-20 ENCOUNTER — Telehealth: Payer: Self-pay | Admitting: Family Medicine

## 2022-06-20 NOTE — Telephone Encounter (Signed)
Contacted Neil Bush to schedule their annual wellness visit. Appointment made for 07/07/2022.  Neil Bush; Care Guide Ambulatory Clinical Support [place] [facility] Direct Dial: [phone]

## 2022-07-07 ENCOUNTER — Ambulatory Visit (INDEPENDENT_AMBULATORY_CARE_PROVIDER_SITE_OTHER): Payer: PPO

## 2022-07-07 VITALS — Ht 70.0 in | Wt 183.0 lb

## 2022-07-07 DIAGNOSIS — Z0000 Encounter for general adult medical examination without abnormal findings: Secondary | ICD-10-CM

## 2022-07-07 NOTE — Patient Instructions (Signed)
Mr. Neil Bush , Thank you for taking time to come for your Medicare Wellness Visit. I appreciate your ongoing commitment to your health goals. Please review the following plan we discussed and let me know if I can assist you in the future.   These are the goals we discussed:  Goals      DIET - EAT MORE FRUITS AND VEGETABLES     Patient Stated     05/04/2020, no goals     Patient Stated     Stay healthy        This is a list of the screening recommended for you and due dates:  Health Maintenance  Topic Date Due   Zoster (Shingles) Vaccine (1 of 2) Never done   COVID-19 Vaccine (4 - 2023-24 season) 11/01/2021   Flu Shot  10/02/2022   Medicare Annual Wellness Visit  07/07/2023   Colon Cancer Screening  04/21/2025   DTaP/Tdap/Td vaccine (3 - Td or Tdap) 03/30/2029   Pneumonia Vaccine  Completed   Hepatitis C Screening: USPSTF Recommendation to screen - Ages 85-79 yo.  Completed   HPV Vaccine  Aged Out    Advanced directives: no  Conditions/risks identified: none  Next appointment: Follow up in one year for your annual wellness visit. 07/13/23 @ 8:15 am by phone  Preventive Care 65 Years and Older, Male  Preventive care refers to lifestyle choices and visits with your health care provider that can promote health and wellness. What does preventive care include? A yearly physical exam. This is also called an annual well check. Dental exams once or twice a year. Routine eye exams. Ask your health care provider how often you should have your eyes checked. Personal lifestyle choices, including: Daily care of your teeth and gums. Regular physical activity. Eating a healthy diet. Avoiding tobacco and drug use. Limiting alcohol use. Practicing safe sex. Taking low doses of aspirin every day. Taking vitamin and mineral supplements as recommended by your health care provider. What happens during an annual well check? The services and screenings done by your health care provider during  your annual well check will depend on your age, overall health, lifestyle risk factors, and family history of disease. Counseling  Your health care provider may ask you questions about your: Alcohol use. Tobacco use. Drug use. Emotional well-being. Home and relationship well-being. Sexual activity. Eating habits. History of falls. Memory and ability to understand (cognition). Work and work Astronomer. Screening  You may have the following tests or measurements: Height, weight, and BMI. Blood pressure. Lipid and cholesterol levels. These may be checked every 5 years, or more frequently if you are over 2 years old. Skin check. Lung cancer screening. You may have this screening every year starting at age 58 if you have a 30-pack-year history of smoking and currently smoke or have quit within the past 15 years. Fecal occult blood test (FOBT) of the stool. You may have this test every year starting at age 58. Flexible sigmoidoscopy or colonoscopy. You may have a sigmoidoscopy every 5 years or a colonoscopy every 10 years starting at age 19. Prostate cancer screening. Recommendations will vary depending on your family history and other risks. Hepatitis C blood test. Hepatitis B blood test. Sexually transmitted disease (STD) testing. Diabetes screening. This is done by checking your blood sugar (glucose) after you have not eaten for a while (fasting). You may have this done every 1-3 years. Abdominal aortic aneurysm (AAA) screening. You may need this if you are a  current or former smoker. Osteoporosis. You may be screened starting at age 51 if you are at high risk. Talk with your health care provider about your test results, treatment options, and if necessary, the need for more tests. Vaccines  Your health care provider may recommend certain vaccines, such as: Influenza vaccine. This is recommended every year. Tetanus, diphtheria, and acellular pertussis (Tdap, Td) vaccine. You may need  a Td booster every 10 years. Zoster vaccine. You may need this after age 45. Pneumococcal 13-valent conjugate (PCV13) vaccine. One dose is recommended after age 38. Pneumococcal polysaccharide (PPSV23) vaccine. One dose is recommended after age 63. Talk to your health care provider about which screenings and vaccines you need and how often you need them. This information is not intended to replace advice given to you by your health care provider. Make sure you discuss any questions you have with your health care provider. Document Released: 03/16/2015 Document Revised: 11/07/2015 Document Reviewed: 12/19/2014 Elsevier Interactive Patient Education  2017 [place] Prevention in the Home Falls can cause injuries. They can happen to people of all ages. There are many things you can do to make your home safe and to help prevent falls. What can I do on the outside of my home? Regularly fix the edges of walkways and driveways and fix any cracks. Remove anything that might make you trip as you walk through a door, such as a raised step or threshold. Trim any bushes or trees on the path to your home. Use bright outdoor lighting. Clear any walking paths of anything that might make someone trip, such as rocks or tools. Regularly check to see if handrails are loose or broken. Make sure that both sides of any steps have handrails. Any raised decks and porches should have guardrails on the edges. Have any leaves, snow, or ice cleared regularly. Use sand or salt on walking paths during winter. Clean up any spills in your garage right away. This includes oil or grease spills. What can I do in the bathroom? Use night lights. Install grab bars by the toilet and in the tub and shower. Do not use towel bars as grab bars. Use non-skid mats or decals in the tub or shower. If you need to sit down in the shower, use a plastic, non-slip stool. Keep the floor dry. Clean up any water that spills on the  floor as soon as it happens. Remove soap buildup in the tub or shower regularly. Attach bath mats securely with double-sided non-slip rug tape. Do not have throw rugs and other things on the floor that can make you trip. What can I do in the bedroom? Use night lights. Make sure that you have a light by your bed that is easy to reach. Do not use any sheets or blankets that are too big for your bed. They should not hang down onto the floor. Have a firm chair that has side arms. You can use this for support while you get dressed. Do not have throw rugs and other things on the floor that can make you trip. What can I do in the kitchen? Clean up any spills right away. Avoid walking on wet floors. Keep items that you use a lot in easy-to-reach places. If you need to reach something above you, use a strong step stool that has a grab bar. Keep electrical cords out of the way. Do not use floor polish or wax that makes floors slippery. If you must use  wax, use non-skid floor wax. Do not have throw rugs and other things on the floor that can make you trip. What can I do with my stairs? Do not leave any items on the stairs. Make sure that there are handrails on both sides of the stairs and use them. Fix handrails that are broken or loose. Make sure that handrails are as long as the stairways. Check any carpeting to make sure that it is firmly attached to the stairs. Fix any carpet that is loose or worn. Avoid having throw rugs at the top or bottom of the stairs. If you do have throw rugs, attach them to the floor with carpet tape. Make sure that you have a light switch at the top of the stairs and the bottom of the stairs. If you do not have them, ask someone to add them for you. What else can I do to help prevent falls? Wear shoes that: Do not have high heels. Have rubber bottoms. Are comfortable and fit you well. Are closed at the toe. Do not wear sandals. If you use a stepladder: Make sure that  it is fully opened. Do not climb a closed stepladder. Make sure that both sides of the stepladder are locked into place. Ask someone to hold it for you, if possible. Clearly mark and make sure that you can see: Any grab bars or handrails. First and last steps. Where the edge of each step is. Use tools that help you move around (mobility aids) if they are needed. These include: Canes. Walkers. Scooters. Crutches. Turn on the lights when you go into a dark area. Replace any light bulbs as soon as they burn out. Set up your furniture so you have a clear path. Avoid moving your furniture around. If any of your floors are uneven, fix them. If there are any pets around you, be aware of where they are. Review your medicines with your doctor. Some medicines can make you feel dizzy. This can increase your chance of falling. Ask your doctor what other things that you can do to help prevent falls. This information is not intended to replace advice given to you by your health care provider. Make sure you discuss any questions you have with your health care provider. Document Released: 12/14/2008 Document Revised: 07/26/2015 Document Reviewed: 03/24/2014 Elsevier Interactive Patient Education  2017 [employer].

## 2022-07-07 NOTE — Progress Notes (Signed)
I connected with  Neil Bush on 07/07/22 by a audio enabled telemedicine application and verified that I am speaking with the correct person using two identifiers.  Patient Location: Home  Provider Location: Office/Clinic  I discussed the limitations of evaluation and management by telemedicine. The patient expressed understanding and agreed to proceed.  Subjective:   Neil Bush is a 72 y.o. male who presents for Medicare Annual/Subsequent preventive examination.  Review of Systems     Cardiac Risk Factors include: advanced age (>20men, >90 women);male gender;hypertension;family history of premature cardiovascular disease     Objective:    There were no vitals filed for this visit. There is no height or weight on file to calculate BMI.     07/07/2022    8:32 AM 04/21/2022    7:03 AM 05/30/2021    9:04 AM 05/04/2020    9:01 AM 12/31/2018   10:48 AM 09/29/2018   11:49 AM 04/17/2017    7:09 AM  Advanced Directives  Does Patient Have a Medical Advance Directive? No Yes Yes Yes Yes Yes No  Type of Special educational needs teacher of [place];Living will Healthcare Power of [employer] of [place];Living will Healthcare Power of [place];Living will Healthcare Power of [place];Living will   Does patient want to make changes to medical advance directive?  No - Patient declined   No - Patient declined No - Patient declined   Copy of Healthcare Power of Attorney in Chart?  No - copy requested No - copy requested No - copy requested No - copy requested No - copy requested   Would patient like information on creating a medical advance directive? No - Patient declined  No - Patient declined  No - Patient declined No - Patient declined No - Patient declined    Current Medications (verified) Outpatient Encounter Medications as of 07/07/2022  Medication Sig   atorvastatin (LIPITOR) 10 MG tablet Take 1 tablet (10 mg total) by mouth daily.   flecainide (TAMBOCOR) 100 MG  tablet Take 1 tablet (100 mg total) by mouth 2 (two) times daily as needed (daily PRN for atrial fibrillation).   lisinopril (ZESTRIL) 20 MG tablet Take 1 tablet (20 mg total) by mouth daily.   metoprolol succinate (TOPROL-XL) 50 MG 24 hr tablet Take 0.5 tablets (25 mg total) by mouth daily. Take with or immediately following a meal.   rivaroxaban (XARELTO) 20 MG TABS tablet Take 1 tablet (20 mg total) by mouth daily.   Facility-Administered Encounter Medications as of 07/07/2022  Medication   triamcinolone acetonide (KENALOG-40) injection 20 mg    Allergies (verified) Penicillins   History: Past Medical History:  Diagnosis Date   Chest pain    a. 01/2016 in setting of Afib;  b. 02/2016 Ex MV: EF 55-65%, no ischemia.   Dysrhythmia    Headache    Hyperlipidemia    Hypertension    PAF (paroxysmal atrial fibrillation) (HCC)    a. 01/2016 - admission for rapid AF and c/p-->converted on IV dilt;  b. CHA2DS2VASc = 2-->Xarelto;  c. 01/2016 Echo: EF 55-60%, triv AI.   Palpitations    a. 08/2016 Zio Patch: symptomatic PAC's and brief (max 11 beats) of SVT/PAT (3 episodes).   Skin cancer    Past Surgical History:  Procedure Laterality Date   CARDIAC CATHETERIZATION     [facility]    CHOLECYSTECTOMY     COLONOSCOPY WITH PROPOFOL N/A 04/17/2017   Procedure: COLONOSCOPY WITH PROPOFOL;  Surgeon: Midge Minium, MD;  Location: [facility];  Service: Endoscopy;  Laterality: N/A;   COLONOSCOPY WITH PROPOFOL N/A 04/21/2022   Procedure: COLONOSCOPY WITH BIOPSY;  Surgeon: Midge Minium, MD;  Location: [facility];  Service: Endoscopy;  Laterality: N/A;   EYE SURGERY     FOOT SURGERY     HERNIA REPAIR     X 2 ingunial   KNEE SURGERY Right    POLYPECTOMY  04/17/2017   Procedure: POLYPECTOMY INTESTINAL;  Surgeon: Midge Minium, MD;  Location: [facility];  Service: Endoscopy;;   POLYPECTOMY  04/21/2022   Procedure: POLYPECTOMY;  Surgeon: Midge Minium, MD;  Location: [facility];   Service: Endoscopy;;   Family History  Problem Relation Age of Onset   Cancer Mother    Heart disease Father    Diabetes Father    Hypertension Father    Heart disease Brother    COPD Neg Hx    Stroke Neg Hx    Social History   Socioeconomic History   Marital status: Married    Spouse name: Not on file   Number of children: Not on file   Years of education: Not on file   Highest education level: Not on file  Occupational History   Occupation: partime truck driver  Tobacco Use   Smoking status: Never   Smokeless tobacco: Never  Vaping Use   Vaping Use: Never used  Substance and Sexual Activity   Alcohol use: No    Alcohol/week: 0.0 standard drinks of alcohol   Drug use: No   Sexual activity: Yes  Other Topics Concern   Not on file  Social History Narrative   Not on file   Social Determinants of Health   Financial Resource Strain: Low Risk  (07/07/2022)   Overall Financial Resource Strain (CARDIA)    Difficulty of Paying Living Expenses: Not hard at all  Food Insecurity: No Food Insecurity (07/07/2022)   Hunger Vital Sign    Worried About Running Out of Food in the Last Year: Never true    Ran Out of Food in the Last Year: Never true  Transportation Needs: No Transportation Needs (07/07/2022)   PRAPARE - Administrator, Civil Service (Medical): No    Lack of Transportation (Non-Medical): No  Physical Activity: Insufficiently Active (07/07/2022)   Exercise Vital Sign    Days of Exercise per Week: 3 days    Minutes of Exercise per Session: 30 min  Stress: No Stress Concern Present (07/07/2022)   [employer] of Occupational Health - Occupational Stress Questionnaire    Feeling of Stress : Not at all  Social Connections: Moderately Isolated (07/07/2022)   Social Connection and Isolation Panel [NHANES]    Frequency of Communication with Friends and Family: Twice a week    Frequency of Social Gatherings with Friends and Family: Never    Attends Religious  Services: More than 4 times per year    Active Member of [employer] or Organizations: No    Attends Engineer, structural: Never    Marital Status: Married    Tobacco Counseling Counseling given: Not Answered   Clinical Intake:  Pre-visit preparation completed: Yes  Pain : No/denies pain     Nutritional Risks: None Diabetes: No  How often do you need to have someone help you when you read instructions, pamphlets, or other written materials from your doctor or pharmacy?: 1 - Never  Diabetic?no     Information entered by :: Kennedy Bucker, LPN   Activities  of Daily Living    07/07/2022    8:32 AM 04/21/2022    7:07 AM  In your present state of health, do you have any difficulty performing the following activities:  Hearing? 0 0  Vision? 0 0  Difficulty concentrating or making decisions? 0 0  Walking or climbing stairs? 0 0  Dressing or bathing? 0 0  Doing errands, shopping? 0   Preparing Food and eating ? N   Using the Toilet? N   In the past six months, have you accidently leaked urine? N   Do you have problems with loss of bowel control? N   Managing your Medications? N   Managing your Finances? N   Housekeeping or managing your Housekeeping? N     Patient Care Team: [name], DO as PCP - General (Family Medicine) Mariah Milling Tollie Pizza, MD as PCP - Cardiology (Cardiology) Bud Face, MD as Referring Physician (Otolaryngology) Elinor Parkinson, DPM as Consulting Physician (Podiatry) Galen Manila, MD as Referring Physician (Ophthalmology) Lemar Livings Merrily Pew, MD (General Surgery) Christena Deem, MD (Inactive) as Consulting Physician (Gastroenterology)  Indicate any recent Medical Services you may have received from other than [facility] providers in the past year (date may be approximate).     Assessment:   This is a routine wellness examination for Neil Bush.  Hearing/Vision screen Hearing Screening - Comments:: No aids Vision Screening -  Comments:: Readers- Dr.Porfilio  Dietary issues and exercise activities discussed: Current Exercise Habits: Home exercise routine, Type of exercise: walking, Time (Minutes): 30, Frequency (Times/Week): 3, Weekly Exercise (Minutes/Week): 90, Intensity: Mild   Goals Addressed             This Visit's Progress    DIET - EAT MORE FRUITS AND VEGETABLES         Depression Screen    07/07/2022    8:30 AM 04/24/2022    8:29 AM 11/21/2021    8:44 AM 10/17/2021    8:29 AM 05/30/2021    9:01 AM 05/04/2020    9:03 AM 04/13/2020    8:05 AM  PHQ 2/9 Scores  PHQ - 2 Score 0 0 0 0 0 0 0  PHQ- 9 Score 0 0 0 1       Fall Risk    07/07/2022    8:32 AM 04/24/2022    8:29 AM 11/21/2021    8:44 AM 04/18/2021    8:36 AM 05/04/2020    9:03 AM  Fall Risk   Falls in the past year? 0 0 0 0 0  Number falls in past yr: 0 0 0 0   Injury with Fall? 0 0 0 0   Risk for fall due to : No Fall Risks No Fall Risks History of fall(s) No Fall Risks Medication side effect  Follow up Falls prevention discussed;Falls evaluation completed Falls evaluation completed Falls evaluation completed Falls evaluation completed Falls evaluation completed;Education provided;Falls prevention discussed    FALL RISK PREVENTION PERTAINING TO THE HOME:  Any stairs in or around the home? Yes  If so, are there any without handrails? No  Home free of loose throw rugs in walkways, pet beds, electrical cords, etc? Yes  Adequate lighting in your home to reduce risk of falls? Yes   ASSISTIVE DEVICES UTILIZED TO PREVENT FALLS:  Life alert? Yes  Use of a cane, walker or w/c? No  Grab bars in the bathroom? No  Shower chair or bench in shower? No  Elevated toilet seat or a handicapped  toilet? Yes    Cognitive Function:        07/07/2022    8:35 AM 05/04/2020    9:04 AM 03/31/2019    3:30 PM  6CIT Screen  What Year? 0 points 0 points 0 points  What month? 0 points 0 points 0 points  What time? 0 points 0 points 0 points  Count back  from 20 0 points 0 points 0 points  Months in reverse 0 points 0 points 0 points  Repeat phrase 0 points 0 points 2 points  Total Score 0 points 0 points 2 points    Immunizations Immunization History  Administered Date(s) Administered   Fluad Quad(high Dose 65+) 04/18/2021   Influenza, High Dose Seasonal PF 01/21/2016   Influenza,inj,Quad PF,6+ Mos 12/26/2014, 01/01/2017   Influenza,inj,quad, With Preservative 01/20/2018, 12/14/2018   Influenza-Unspecified 01/20/2018, 12/18/2021   Moderna Sars-Covid-2 Vaccination 05/02/2019, 05/23/2019, 01/05/2020   Pneumococcal Conjugate-13 12/26/2014   Pneumococcal Polysaccharide-23 01/21/2016   Td 03/31/2019   Tdap 11/02/2008   Zoster, Live 11/12/2010    TDAP status: Up to date  Flu Vaccine status: Up to date  Pneumococcal vaccine status: Up to date  Covid-19 vaccine status: Completed vaccines  Qualifies for Shingles Vaccine? Yes   Zostavax completed Yes   Shingrix Completed?: No.    Education has been provided regarding the importance of this vaccine. Patient has been advised to call insurance company to determine out of pocket expense if they have not yet received this vaccine. Advised may also receive vaccine at local pharmacy or Health Dept. Verbalized acceptance and understanding.  Screening Tests Health Maintenance  Topic Date Due   Zoster Vaccines- Shingrix (1 of 2) Never done   COVID-19 Vaccine (4 - 2023-24 season) 11/01/2021   INFLUENZA VACCINE  10/02/2022   Medicare Annual Wellness (AWV)  07/07/2023   COLONOSCOPY (Pts 45-44yrs Insurance coverage will need to be confirmed)  04/21/2025   DTaP/Tdap/Td (3 - Td or Tdap) 03/30/2029   Pneumonia Vaccine 71+ Years old  Completed   Hepatitis C Screening  Completed   HPV VACCINES  Aged Out    Health Maintenance  Health Maintenance Due  Topic Date Due   Zoster Vaccines- Shingrix (1 of 2) Never done   COVID-19 Vaccine (4 - 2023-24 season) 11/01/2021    Colorectal cancer  screening: Type of screening: Colonoscopy. Completed 04/21/22. Repeat every 3 years  Lung Cancer Screening: (Low Dose CT Chest recommended if Age 51-80 years, 30 pack-year currently smoking OR have quit w/in 15years.) does not qualify.     Additional Screening:  Hepatitis C Screening: does qualify; Completed 04/09/17  Vision Screening: Recommended annual ophthalmology exams for early detection of glaucoma and other disorders of the eye. Is the patient up to date with their annual eye exam?  Yes  Who is the provider or what is the name of the office in which the patient attends annual eye exams? Dr.Porfilio If pt is not established with a provider, would they like to be referred to a provider to establish care? No .   Dental Screening: Recommended annual dental exams for proper oral hygiene  Community Resource Referral / Chronic Care Management: CRR required this visit?  No   CCM required this visit?  No      Plan:     I have personally reviewed and noted the following in the patient's chart:   Medical and social history Use of alcohol, tobacco or illicit drugs  Current medications and supplements including opioid prescriptions. Patient  is not currently taking opioid prescriptions. Functional ability and status Nutritional status Physical activity Advanced directives List of other physicians Hospitalizations, surgeries, and ER visits in previous 12 months Vitals Screenings to include cognitive, depression, and falls Referrals and appointments  In addition, I have reviewed and discussed with patient certain preventive protocols, quality metrics, and best practice recommendations. A written personalized care plan for preventive services as well as general preventive health recommendations were provided to patient.     Hal Hope, LPN   4/0/9811   Nurse Notes: none

## 2022-09-07 NOTE — Progress Notes (Unsigned)
Cardiology Office Note  Date:  09/08/2022   ID:  Neil Bush, DOB 05-01-1950, MRN [id number]  PCP:  [name], DO   Chief Complaint  Patient presents with   12 month follow up     Patient c/o shortness of breath with the least amount of activity. Medications reviewed by the patient verbally.     HPI:  72 year old male with a prior history of  paroxysmal atrial fibrillation, 01/26/2016, 2020 CHA2DS2V 6 ASc score of 2 (HTN, age x1).  hypertension,  hyperlipidemia,  chest pain , Stress test September 30, 2018 No ischemia low risk study CT calcium score 0 who presents for follow-up of his paroxysmal atrial fibrillation  Last seen in the office by myself on May 2023 One month ago, afib, took flecainide and converted back to normal sinus rhythm  Last weekend had afib,, appreciated arrhythmia overnight Took flecainide in the Am, in 30 -45 min back to normal sinus rhythm  Some SOB on light walking, breathing "feels funny" Sx for the past 6-8 months Denies significant chest pain No lower extremity edema, no PND orthopnea, no weight change  Takes metoprolol succinate 25 daily, lisinopril 10 daily  Lab work reviewed Total cholesterol 176 LDL 109 In February 2024  [place]  CT coronary calcium score of 0 November 2021  Tolerating Xarelto, concerned about donut hole  EKG personally reviewed by myself on todays visit EKG Interpretation Date/Time:  Monday September 08 2022 08:23:13 EDT Ventricular Rate:  67 PR Interval:  222 QRS Duration:  90 QT Interval:  380 QTC Calculation: 401 R Axis:   20  Text Interpretation: Sinus rhythm with 1st degree A-V block When compared with ECG of 08-Apr-2021 01:47, PR interval has increased T wave inversion no longer evident in Inferior leads Confirmed by Julien Nordmann [phone]) on 09/08/2022 8:44:18 AM   Her past medical history reviewed Seen in the hospital end of July 2020 emergency room September 29, 2018 for chest pain, atrial  fibrillation Reports that he was sitting in his truck getting ready to go to work when he developed tightness in his chest.  Felt his pulse, appreciated it was rapid and irregular Symptoms started approximately 6:30 AM and persisted Noted to be in atrial fibrillation with RVR rate 140 bpm He presented to the emergency room, converted to normal sinus rhythm around 11:30 AM tnt 200 Converted 2 normal sinus rhythm after 5 hours in the emergency room   EKG from the  emergency room showing confirming atrial fibrillation ventricular rate 140 bpm Stress test September 30, 2018 No ischemia low risk study  November 2017 he was admitted with chest pain and A. fib and converted to sinus rhythm on IV diltiazem.  Started on Xarelto in the setting of a CHA2DS2VASc of 2.   stress testing which did not show ischemia.  December 2017 Echo showed normal LV function.    PMH:   has a past medical history of Chest pain, Dysrhythmia, Headache, Hyperlipidemia, Hypertension, PAF (paroxysmal atrial fibrillation) (HCC), Palpitations, and Skin cancer.  PSH:    Past Surgical History:  Procedure Laterality Date   CARDIAC CATHETERIZATION     [facility]    CHOLECYSTECTOMY     COLONOSCOPY WITH PROPOFOL N/A 04/17/2017   Procedure: COLONOSCOPY WITH PROPOFOL;  Surgeon: Midge Minium, MD;  Location: [facility];  Service: Endoscopy;  Laterality: N/A;   COLONOSCOPY WITH PROPOFOL N/A 04/21/2022   Procedure: COLONOSCOPY WITH BIOPSY;  Surgeon: Midge Minium, MD;  Location: [facility]  SURGERY [facility];  Service: Endoscopy;  Laterality: N/A;   EYE SURGERY     FOOT SURGERY     HERNIA REPAIR     X 2 ingunial   KNEE SURGERY Right    POLYPECTOMY  04/17/2017   Procedure: POLYPECTOMY INTESTINAL;  Surgeon: Midge Minium, MD;  Location: [facility];  Service: Endoscopy;;   POLYPECTOMY  04/21/2022   Procedure: POLYPECTOMY;  Surgeon: Midge Minium, MD;  Location: [facility];  Service: Endoscopy;;    Current Outpatient  Medications  Medication Sig Dispense Refill   atorvastatin (LIPITOR) 10 MG tablet Take 1 tablet (10 mg total) by mouth daily. 90 tablet 1   flecainide (TAMBOCOR) 100 MG tablet Take 1 tablet (100 mg total) by mouth 2 (two) times daily as needed (daily PRN for atrial fibrillation). 90 tablet 3   lisinopril (ZESTRIL) 20 MG tablet Take 1 tablet (20 mg total) by mouth daily. (Patient taking differently: Take 20 mg by mouth daily. Patient taking 0.5 tablet daily.) 90 tablet 1   rivaroxaban (XARELTO) 20 MG TABS tablet Take 1 tablet (20 mg total) by mouth daily. 90 tablet 1   metoprolol succinate (TOPROL-XL) 50 MG 24 hr tablet Take 1 tablet (50 mg total) by mouth daily. Take with or immediately following a meal. 90 tablet 3   Current Facility-Administered Medications  Medication Dose Route Frequency Provider Last Rate Last Admin   triamcinolone acetonide (KENALOG-40) injection 20 mg  20 mg Other Once Hyatt, Max T, DPM        Allergies:   Penicillins   Social History:  The patient  reports that he has never smoked. He has never used smokeless tobacco. He reports that he does not drink alcohol and does not use drugs.   Family History:   family history includes Cancer in his mother; Diabetes in his father; Heart disease in his brother and father; Hypertension in his father.    Review of Systems: Review of Systems  Constitutional: Negative.   HENT: Negative.    Respiratory: Negative.    Cardiovascular: Negative.   Gastrointestinal: Negative.   Musculoskeletal: Negative.   Neurological: Negative.   Psychiatric/Behavioral: Negative.    All other systems reviewed and are negative.   PHYSICAL EXAM: VS:  BP 118/76 (BP Location: Left Arm, Patient Position: Sitting, Cuff Size: Normal)   Pulse 67   Ht 5\' 10"  (1.778 m)   Wt 180 lb 8 oz (81.9 kg)   SpO2 96%   BMI 25.90 kg/m  , BMI Body mass index is 25.9 kg/m. Constitutional:  oriented to person, place, and time. No distress.  HENT:  Head:  Grossly normal Eyes:  no discharge. No scleral icterus.  Neck: No JVD, no carotid bruits  Cardiovascular: Regular rate and rhythm, no murmurs appreciated Pulmonary/Chest: Clear to auscultation bilaterally, no wheezes or rails Abdominal: Soft.  no distension.  no tenderness.  Musculoskeletal: Normal range of motion Neurological:  normal muscle tone. Coordination normal. No atrophy Skin: Skin warm and dry Psychiatric: normal affect, pleasant   Recent Labs: 04/24/2022: ALT 18; BUN 24; Creatinine, Ser 1.27; Hemoglobin 15.9; Platelets 250; Potassium 5.3; Sodium 140; TSH 1.810    Lipid Panel Lab Results  Component Value Date   CHOL 176 04/24/2022   HDL 39 (L) 04/24/2022   LDLCALC 109 (H) 04/24/2022   TRIG 159 (H) 04/24/2022      Wt Readings from Last 3 Encounters:  09/08/22 180 lb 8 oz (81.9 kg)  07/07/22 183 lb (83 kg)  04/24/22  183 lb 4.8 oz (83.1 kg)      ASSESSMENT AND PLAN:  Paroxysmal atrial fibrillation (HCC) - Continue Xarelto 20 mg daily,   We recommended to increase metoprolol succinate up to 50 daily Flecainide 100 twice daily as needed For breakthrough atrial fibrillation recommended he take flecainide with extra 1/2 dose of metoprolol  Essential hypertension Metoprolol succinate up to 50 daily, reports he also takes lisinopril 10 daily  Mixed hyperlipidemia Continue Lipitor 20  Chest pain, unspecified type No recent chest pain symptoms Prior work-up with calcium score of 0  Shortness of breath Etiology unclear, denies fluid retention, PND orthopnea Less likely ischemia Echocardiogram ordered If symptoms persist or get worse, cardiac CTA could be ordered  DOT clearance Clearance can be given if needed   Total encounter time more than 30 minutes  Greater than 50% was spent in counseling and coordination of care with the patient    Orders Placed This Encounter  Procedures   EKG 12-Lead   ECHOCARDIOGRAM COMPLETE     Signed, Dossie Arbour, M.D.,  Ph.D. 09/08/2022  [facility] [place], [place] [phone]

## 2022-09-08 ENCOUNTER — Ambulatory Visit: Payer: PPO | Attending: Cardiovascular Disease | Admitting: Cardiovascular Disease

## 2022-09-08 ENCOUNTER — Encounter: Payer: Self-pay | Admitting: Cardiovascular Disease

## 2022-09-08 DIAGNOSIS — I480 Paroxysmal atrial fibrillation: Secondary | ICD-10-CM | POA: Diagnosis not present

## 2022-09-08 DIAGNOSIS — R079 Chest pain, unspecified: Secondary | ICD-10-CM

## 2022-09-08 DIAGNOSIS — I351 Nonrheumatic aortic (valve) insufficiency: Secondary | ICD-10-CM | POA: Diagnosis not present

## 2022-09-08 DIAGNOSIS — E785 Hyperlipidemia, unspecified: Secondary | ICD-10-CM | POA: Diagnosis not present

## 2022-09-08 DIAGNOSIS — E782 Mixed hyperlipidemia: Secondary | ICD-10-CM

## 2022-09-08 DIAGNOSIS — R0600 Dyspnea, unspecified: Secondary | ICD-10-CM

## 2022-09-08 DIAGNOSIS — I10 Essential (primary) hypertension: Secondary | ICD-10-CM

## 2022-09-08 MED ORDER — METOPROLOL SUCCINATE ER 50 MG PO TB24: 50.0000 mg | ORAL_TABLET | Freq: Every day | ORAL | 3 refills | Status: DC

## 2022-09-08 MED ORDER — RIVAROXABAN 20 MG PO TABS: 20.0000 mg | ORAL_TABLET | Freq: Every day | ORAL | 0 refills | Status: DC

## 2022-09-08 NOTE — Patient Instructions (Addendum)
Medication Instructions:  Please increase the metoprolol succinate up to 50 mg daily  If you need a refill on your cardiac medications before your next appointment, please call your pharmacy.   Lab work: No new labs needed  Testing/Procedures:  Your physician has requested that you have an echocardiogram. Echocardiography is a painless test that uses sound waves to create images of your heart. It provides your doctor with information about the size and shape of your heart and how well your heart's chambers and valves are working.   You may receive an ultrasound enhancing agent through an IV if needed to better visualize your heart during the echo. This procedure takes approximately one hour.  There are no restrictions for this procedure.  This will take place at [address] [address], [place] [address]   Follow-Up: At [facility], you and your health needs are our priority.  As part of our continuing mission to provide you with exceptional heart care, we have created designated Provider Care Teams.  These Care Teams include your primary Cardiologist (physician) and Advanced Practice Providers (APPs -  Physician Assistants and Nurse Practitioners) who all work together to provide you with the care you need, when you need it.  You will need a follow up appointment in 12 months  Providers on your designated Care Team:   Nicolasa Ducking, NP Eula Listen, PA-C Cadence Fransico Michael, [place]  COVID-19 Vaccine Information can be found at: [email or web address] For questions related to vaccine distribution or appointments, please email [email or web address] or call [phone].

## 2022-10-01 ENCOUNTER — Ambulatory Visit: Payer: PPO

## 2022-10-01 DIAGNOSIS — R0600 Dyspnea, unspecified: Secondary | ICD-10-CM

## 2022-10-01 LAB — ECHOCARDIOGRAM COMPLETE
Area-P 1/2: 3.27 cm2
S' Lateral: 3.5 cm

## 2022-10-23 ENCOUNTER — Ambulatory Visit (INDEPENDENT_AMBULATORY_CARE_PROVIDER_SITE_OTHER): Payer: PPO | Admitting: Family Medicine

## 2022-10-23 ENCOUNTER — Encounter: Payer: Self-pay | Admitting: Family Medicine

## 2022-10-23 VITALS — BP 132/82 | HR 53 | Temp 97.7°F | Wt 183.0 lb

## 2022-10-23 DIAGNOSIS — I480 Paroxysmal atrial fibrillation: Secondary | ICD-10-CM | POA: Diagnosis not present

## 2022-10-23 DIAGNOSIS — E782 Mixed hyperlipidemia: Secondary | ICD-10-CM | POA: Diagnosis not present

## 2022-10-23 DIAGNOSIS — I10 Essential (primary) hypertension: Secondary | ICD-10-CM

## 2022-10-23 MED ORDER — RIVAROXABAN 20 MG PO TABS: 20.0000 mg | ORAL_TABLET | Freq: Every day | ORAL | 1 refills | Status: DC

## 2022-10-23 MED ORDER — LISINOPRIL 20 MG PO TABS: 10.0000 mg | ORAL_TABLET | Freq: Every day | ORAL | 1 refills | Status: DC

## 2022-10-23 MED ORDER — ATORVASTATIN CALCIUM 10 MG PO TABS: 10.0000 mg | ORAL_TABLET | Freq: Every day | ORAL | 1 refills | Status: DC

## 2022-10-23 NOTE — Assessment & Plan Note (Signed)
Under good control on current regimen. Continue current regimen. Continue to monitor. Call with any concerns. Refills given. Labs drawn today.   

## 2022-10-23 NOTE — Progress Notes (Signed)
BP 132/82   Pulse (!) 53   Temp 97.7 F (36.5 C) (Oral)   Wt 183 lb (83 kg)   SpO2 97%   BMI 26.26 kg/m    Subjective:    Patient ID: Neil Bush, male    DOB: [birth year], 72 y.o.   MRN: [id number]  HPI: Neil Bush is a 72 y.o. male  Chief Complaint  Patient presents with   Hyperlipidemia   Hypertension   HYPERTENSION / HYPERLIPIDEMIA Satisfied with current treatment? yes Duration of hypertension: chronic BP monitoring frequency: not checking BP medication side effects: no Past BP meds: lisinopril, metoprolol Duration of hyperlipidemia: chronic Cholesterol medication side effects: no Cholesterol supplements: none Past cholesterol medications: atorvastatin Medication compliance: excellent compliance Aspirin: no Recent stressors: no Recurrent headaches: no Visual changes: no Palpitations: no Dyspnea: no Chest pain: no Lower extremity edema: no Dizzy/lightheaded: no  Relevant past medical, surgical, family and social history reviewed and updated as indicated. Interim medical history since our last visit reviewed. Allergies and medications reviewed and updated.  Review of Systems  Constitutional: Negative.   Respiratory: Negative.    Cardiovascular: Negative.   Gastrointestinal: Negative.   Musculoskeletal: Negative.   Neurological: Negative.   Psychiatric/Behavioral: Negative.      Per HPI unless specifically indicated above     Objective:    BP 132/82   Pulse (!) 53   Temp 97.7 F (36.5 C) (Oral)   Wt 183 lb (83 kg)   SpO2 97%   BMI 26.26 kg/m   Wt Readings from Last 3 Encounters:  10/23/22 183 lb (83 kg)  09/08/22 180 lb 8 oz (81.9 kg)  07/07/22 183 lb (83 kg)    Physical Exam Vitals and nursing note reviewed.  Constitutional:      General: He is not in acute distress.    Appearance: Normal appearance. He is not ill-appearing, toxic-appearing or diaphoretic.  HENT:     Head: Normocephalic and atraumatic.     Right Ear: External  ear normal.     Left Ear: External ear normal.     Nose: Nose normal.     Mouth/Throat:     Mouth: Mucous membranes are moist.     Pharynx: Oropharynx is clear.  Eyes:     General: No scleral icterus.       Right eye: No discharge.        Left eye: No discharge.     Extraocular Movements: Extraocular movements intact.     Conjunctiva/sclera: Conjunctivae normal.     Pupils: Pupils are equal, round, and reactive to light.  Cardiovascular:     Rate and Rhythm: Normal rate and regular rhythm.     Pulses: Normal pulses.     Heart sounds: Normal heart sounds. No murmur heard.    No friction rub. No gallop.  Pulmonary:     Effort: Pulmonary effort is normal. No respiratory distress.     Breath sounds: Normal breath sounds. No stridor. No wheezing, rhonchi or rales.  Chest:     Chest wall: No tenderness.  Musculoskeletal:        General: Normal range of motion.     Cervical back: Normal range of motion and neck supple.  Skin:    General: Skin is warm and dry.     Capillary Refill: Capillary refill takes less than 2 seconds.     Coloration: Skin is not jaundiced or pale.     Findings: No bruising, erythema, lesion or rash.  Neurological:     General: No focal deficit present.     Mental Status: He is alert and oriented to person, place, and time. Mental status is at baseline.  Psychiatric:        Mood and Affect: Mood normal.        Behavior: Behavior normal.        Thought Content: Thought content normal.        Judgment: Judgment normal.     Results for orders placed or performed in visit on 10/01/22  ECHOCARDIOGRAM COMPLETE  Result Value Ref Range   S' Lateral 3.50 cm   Area-P 1/2 3.27 cm2   Est EF 55 - 60%       Assessment & Plan:   Problem List Items Addressed This Visit       Cardiovascular and Mediastinum   Essential hypertension    Under good control on current regimen. Continue current regimen. Continue to monitor. Call with any concerns. Refills given. Labs  drawn today.        Relevant Medications   atorvastatin (LIPITOR) 10 MG tablet   lisinopril (ZESTRIL) 20 MG tablet   rivaroxaban (XARELTO) 20 MG TABS tablet   Other Relevant Orders   Comprehensive metabolic panel   Paroxysmal atrial fibrillation (HCC) - Primary    Stable. Continue to follow with cardiology. Will refer to pharmacy for help with xarelto cost.      Relevant Medications   atorvastatin (LIPITOR) 10 MG tablet   lisinopril (ZESTRIL) 20 MG tablet   rivaroxaban (XARELTO) 20 MG TABS tablet   Other Relevant Orders   AMB Referral to Pharmacy Medication Management   CBC with Differential/Platelet   Comprehensive metabolic panel     Other   Hyperlipidemia    Under good control on current regimen. Continue current regimen. Continue to monitor. Call with any concerns. Refills given. Labs drawn today.       Relevant Medications   atorvastatin (LIPITOR) 10 MG tablet   lisinopril (ZESTRIL) 20 MG tablet   rivaroxaban (XARELTO) 20 MG TABS tablet   Other Relevant Orders   Lipid Panel w/o Chol/HDL Ratio   Comprehensive metabolic panel     Follow up plan: Return in about 6 months (around 04/25/2023) for physical.

## 2022-10-23 NOTE — Assessment & Plan Note (Signed)
Stable. Continue to follow with cardiology. Will refer to pharmacy for help with xarelto cost.

## 2022-10-24 LAB — COMPREHENSIVE METABOLIC PANEL
ALT: 17 IU/L (ref 0–44)
AST: 17 IU/L (ref 0–40)
Albumin: 4.3 g/dL (ref 3.8–4.8)
Alkaline Phosphatase: 90 IU/L (ref 44–121)
BUN/Creatinine Ratio: 15 (ref 10–24)
BUN: 21 mg/dL (ref 8–27)
Bilirubin Total: 0.7 mg/dL (ref 0.0–1.2)
CO2: 24 mmol/L (ref 20–29)
Calcium: 8.7 mg/dL (ref 8.6–10.2)
Chloride: 104 mmol/L (ref 96–106)
Creatinine, Ser: 1.37 mg/dL — ABNORMAL HIGH (ref 0.76–1.27)
Globulin, Total: 2.2 g/dL (ref 1.5–4.5)
Glucose: 86 mg/dL (ref 70–99)
Potassium: 4.6 mmol/L (ref 3.5–5.2)
Sodium: 142 mmol/L (ref 134–144)
Total Protein: 6.5 g/dL (ref 6.0–8.5)
eGFR: 54 mL/min/{1.73_m2} — ABNORMAL LOW (ref >59)

## 2022-10-24 LAB — CBC WITH DIFFERENTIAL/PLATELET
Basophils Absolute: 0.1 10*3/uL (ref 0.0–0.2)
Basos: 1 %
EOS (ABSOLUTE): 0.2 10*3/uL (ref 0.0–0.4)
Eos: 2 %
Hematocrit: 47.6 % (ref 37.5–51.0)
Hemoglobin: 16.2 g/dL (ref 13.0–17.7)
Immature Grans (Abs): 0 10*3/uL (ref 0.0–0.1)
Immature Granulocytes: 0 %
Lymphocytes Absolute: 1.8 10*3/uL (ref 0.7–3.1)
Lymphs: 25 %
MCH: 32.4 pg (ref 26.6–33.0)
MCHC: 34 g/dL (ref 31.5–35.7)
MCV: 95 fL (ref 79–97)
Monocytes Absolute: 0.7 10*3/uL (ref 0.1–0.9)
Monocytes: 10 %
Neutrophils Absolute: 4.5 10*3/uL (ref 1.4–7.0)
Neutrophils: 62 %
Platelets: 231 10*3/uL (ref 150–450)
RBC: 5 x10E6/uL (ref 4.14–5.80)
RDW: 13 % (ref 11.6–15.4)
WBC: 7.2 10*3/uL (ref 3.4–10.8)

## 2022-10-24 LAB — LIPID PANEL W/O CHOL/HDL RATIO
Cholesterol, Total: 141 mg/dL (ref 100–199)
HDL: 37 mg/dL — ABNORMAL LOW (ref >39)
LDL Chol Calc (NIH): 70 mg/dL (ref 0–99)
Triglycerides: 207 mg/dL — ABNORMAL HIGH (ref 0–149)
VLDL Cholesterol Cal: 34 mg/dL (ref 5–40)

## 2022-10-26 ENCOUNTER — Encounter: Payer: Self-pay | Admitting: Family Medicine

## 2022-10-29 NOTE — Progress Notes (Signed)
Confirmed with Iris that she did mail these out.

## 2022-10-30 ENCOUNTER — Telehealth: Payer: Self-pay

## 2022-10-30 NOTE — Progress Notes (Signed)
   Care Guide Note  10/30/2022 Name: Neil Bush MRN: [id number] DOB: November 18, 1950  Referred by: [name], DO Reason for referral : Care Coordination (Outreach to schedule with Pharm d )   Neil Bush is a 72 y.o. year old male who is a primary care patient of [name], DO. [name] was referred to the pharmacist for assistance related to Atrial Fibrillation.    Successful contact was made with the patient to discuss pharmacy services including being ready for the pharmacist to call at least 5 minutes before the scheduled appointment time, to have medication bottles and any blood sugar or blood pressure readings ready for review. The patient agreed to meet with the pharmacist via with the pharmacist via telephone visit on (date/time).  11/12/2022  Neil Bush, RMA Care Guide [facility]  [place], [place] [address] Direct Dial: [phone] [email or web address]

## 2022-11-12 ENCOUNTER — Other Ambulatory Visit: Payer: PPO

## 2022-11-12 NOTE — Progress Notes (Signed)
   11/12/2022 Name: Neil Bush MRN: [id number] DOB: September 11, 1950  Chief Complaint  Patient presents with   Medication Assistance   [name] is a 72 y.o. year old male who presented for a telephone visit.   They were referred to the pharmacist by their PCP for assistance in managing medication access.    Subjective:  Care Team: Primary Care Provider: [name], DO ; Next Scheduled Visit: 04/28/23  Medication Access/Adherence  Current Pharmacy:  [facility] - [place], [place] - [address] [place] [address] Phone: [phone] Fax: [phone]  [id number] [place], [place] - [address] AT [facility] [address] [facility] [address] [place] [address] Phone: [phone] Fax: [phone]  -Patient reports affordability concerns with their medications: Yes -Xarelto.  Medication costs $90 for 3 month supply. -Patient reports access/transportation concerns to their pharmacy: No  -Patient reports adherence concerns with their medications:  No    Objective: Medications Reviewed Today     Reviewed by Lenna Gilford, [facility] (Pharmacist) on 11/12/22 at [phone]  Med List Status: <None>   Medication Order Taking? Sig Documenting Provider Last Dose Status Informant  atorvastatin (LIPITOR) 10 MG tablet [id number] Yes Take 1 tablet (10 mg total) by mouth daily. Neil Perches P, DO Taking Active   flecainide (TAMBOCOR) 100 MG tablet [id number] Yes Take 1 tablet (100 mg total) by mouth 2 (two) times daily as needed (daily PRN for atrial fibrillation). Neil Iba, MD Taking Active   lisinopril (ZESTRIL) 20 MG tablet [id number] Yes Take 0.5 tablets (10 mg total) by mouth daily. Johnson, Megan P, DO Taking Active   metoprolol succinate (TOPROL-XL) 50 MG 24 hr tablet [id number] Yes Take 1 tablet (50 mg total) by mouth daily. Take with or immediately following a meal. Gollan, Tollie Pizza, MD Taking Active   rivaroxaban (XARELTO) 20 MG TABS  tablet [id number] Yes Take 1 tablet (20 mg total) by mouth daily. Neil Perches P, DO Taking Active   triamcinolone acetonide (KENALOG-40) injection 20 mg [id number]   Neil Bush, [place]  Active            Assessment/Plan:   Medication Access/Adherence -Neil Bush no longer offers PAP for patients to get Xarelto at no cost; but they do have a Xarelto with Me program that makes a 30 day supply $89, or a 90 day supply $250 -Eliquis has PAP, but the patient would not meet OOP spending requirement for this program -Discussed warfarin, but patient prefers to stay on Xarelto 20mg  daily at this time to prevent frequent INR monitoring -He is able to afford currently copay, but will contact me if he gets in the coverage gap; so we can apply for the Xarelto with Me program  Follow Up Plan: None scheduled but patient has my direct phone number to call if he gets into the coverage gap with Medicare D plan  Lenna Gilford, PharmD, DPLA

## 2023-01-04 ENCOUNTER — Other Ambulatory Visit: Payer: Self-pay | Admitting: Family Medicine

## 2023-01-06 NOTE — Telephone Encounter (Signed)
Requested Prescriptions  Refused Prescriptions Disp Refills   XARELTO 20 MG TABS tablet [Pharmacy Med Name: XARELTO 20MG  TABLETS] 90 tablet 1    Sig: TAKE ONE TABLET BY MOUTH DAILY     Hematology: Anticoagulants - rivaroxaban Failed - 01/04/2023 11:02 AM      Failed - Cr in normal range and within 360 days    Creatinine  Date Value Ref Range Status  10/30/2011 1.05 0.60 - 1.30 mg/dL Final   Creatinine, Ser  Date Value Ref Range Status  10/23/2022 1.37 (H) 0.76 - 1.27 mg/dL Final         Passed - ALT in normal range and within 360 days    ALT  Date Value Ref Range Status  10/23/2022 17 0 - 44 IU/L Final   ALT (SGPT) Piccolo, Waived  Date Value Ref Range Status  10/15/2018 24 10 - 47 U/L Final         Passed - AST in normal range and within 360 days    AST  Date Value Ref Range Status  10/23/2022 17 0 - 40 IU/L Final   AST (SGOT) Piccolo, Waived  Date Value Ref Range Status  10/15/2018 22 11 - 38 U/L Final         Passed - HCT in normal range and within 360 days    Hematocrit  Date Value Ref Range Status  10/23/2022 47.6 37.5 - 51.0 % Final         Passed - HGB in normal range and within 360 days    Hemoglobin  Date Value Ref Range Status  10/23/2022 16.2 13.0 - 17.7 g/dL Final         Passed - PLT in normal range and within 360 days    Platelets  Date Value Ref Range Status  10/23/2022 231 150 - 450 x10E3/uL Final         Passed - eGFR is 15 or above and within 360 days    EGFR (African American)  Date Value Ref Range Status  10/30/2011 >60  Final   GFR calc Af Amer  Date Value Ref Range Status  04/13/2020 69 >59 mL/min/1.73 Final    Comment:    **In accordance with recommendations from the NKF-ASN Task force,**   [facility] is in the process of updating its eGFR calculation to the   2021 CKD-EPI creatinine equation that estimates kidney function   without a race variable.    EGFR (Non-African Amer.)  Date Value Ref Range Status  10/30/2011 >60   Final    Comment:    eGFR values <62mL/min/1.73 m2 may be an indication of chronic kidney disease (CKD). Calculated eGFR is useful in patients with stable renal function. The eGFR calculation will not be reliable in acutely ill patients when serum creatinine is changing rapidly. It is not useful in  patients on dialysis. The eGFR calculation may not be applicable to patients at the low and high extremes of body sizes, pregnant women, and vegetarians.    GFR, Estimated  Date Value Ref Range Status  04/08/2021 >60 >60 mL/min Final    Comment:    (NOTE) Calculated using the CKD-EPI Creatinine Equation (2021)    eGFR  Date Value Ref Range Status  10/23/2022 54 (L) >59 mL/min/1.73 Final         Passed - Patient is not pregnant      Passed - Valid encounter within last 12 months    Recent Outpatient Visits  2 months ago Paroxysmal atrial fibrillation [facility])   [place] [place], Megan P, DO   8 months ago Routine general medical examination at a health care facility   [facility] [place], [place] P, DO   1 year ago Mixed hyperlipidemia   [facility] [place], Megan P, DO   1 year ago Mixed hyperlipidemia   [facility] [place], Megan P, DO   1 year ago Routine general medical examination at a health care facility   [facility] [name], DO       Future Appointments             In 3 weeks Bethanie Dicker, NP [facility] [place], [facility]   In 3 months Laural Benes, Oralia Rud, DO [facility] [employer] [facility], [facility]

## 2023-01-26 ENCOUNTER — Telehealth: Payer: Self-pay

## 2023-01-26 NOTE — Telephone Encounter (Signed)
Called pt and he confirmed that he is okay with coming at the same time as his wife at 2 pm and being seen together.

## 2023-01-27 ENCOUNTER — Ambulatory Visit: Payer: PPO | Admitting: Nurse Practitioner

## 2023-01-27 ENCOUNTER — Encounter: Payer: Self-pay | Admitting: Nurse Practitioner

## 2023-01-27 VITALS — BP 136/78 | HR 65 | Temp 98.1°F | Ht 68.5 in | Wt 182.0 lb

## 2023-01-27 DIAGNOSIS — M25561 Pain in right knee: Secondary | ICD-10-CM

## 2023-01-27 DIAGNOSIS — E782 Mixed hyperlipidemia: Secondary | ICD-10-CM

## 2023-01-27 DIAGNOSIS — G8929 Other chronic pain: Secondary | ICD-10-CM

## 2023-01-27 DIAGNOSIS — I480 Paroxysmal atrial fibrillation: Secondary | ICD-10-CM | POA: Diagnosis not present

## 2023-01-27 DIAGNOSIS — Z23 Encounter for immunization: Secondary | ICD-10-CM | POA: Diagnosis not present

## 2023-01-27 DIAGNOSIS — I10 Essential (primary) hypertension: Secondary | ICD-10-CM

## 2023-01-27 DIAGNOSIS — N400 Enlarged prostate without lower urinary tract symptoms: Secondary | ICD-10-CM

## 2023-01-27 MED ORDER — CELECOXIB 100 MG PO CAPS: 100.0000 mg | ORAL_CAPSULE | Freq: Two times a day (BID) | ORAL | 3 refills | Status: DC

## 2023-01-27 NOTE — Progress Notes (Signed)
Bethanie Dicker, NP-C Phone: [phone]  Neil Bush is a 72 y.o. male who presents today to establish care.   Discussed the use of AI scribe software for clinical note transcription with the patient, who gave verbal consent to proceed.  History of Present Illness   The patient, with a history of atrial fibrillation (AFib), hypertension, and benign prostatic hyperplasia (BPH), presents with concerns about his blood pressure management and knee pain. He has been self-adjusting his metoprolol and lisinopril doses, taking half of each due to feelings of sluggishness and fatigue when taking the full doses. The patient suspects the lisinopril as the cause of his lethargy.  The patient's blood pressure readings at home range from 130 to 140 systolic and around 78 diastolic. He has been managing his AFib with flecainide as needed, which he reports is effective in controlling his heart rate. He also takes Xarelto as a blood thinner.  Regarding his BPH, the patient reports a weak urinary stream and nocturia, waking up twice a night to urinate. However, he denies any difficulty initiating urination.  The patient has been taking meloxicam for knee pain following an incident where he fell and cracked his kneecap. He reports that the meloxicam has provided some relief, but he is interested in switching to Celebrex, which he took years ago and found to be more effective. He is also on Lipitor for cholesterol management.      Active Ambulatory Problems    Diagnosis Date Noted   Hyperlipidemia    Essential hypertension    Ichthyosis 12/26/2014   BPH (benign prostatic hyperplasia) 12/26/2014   Near syncope 11/20/2015   Paroxysmal atrial fibrillation (HCC) 01/26/2016   Nonrheumatic aortic valve insufficiency    History of colonic polyps    Polyp of sigmoid colon    Benign neoplasm of ascending colon    Benign neoplasm of transverse colon    Advanced care planning/counseling discussion 03/29/2018    Osteoarthritis of left knee 01/14/2019   Migraine with aura 12/01/2019   Polyp of ascending colon 04/21/2022   Chronic pain of right knee 01/27/2023   Resolved Ambulatory Problems    Diagnosis Date Noted   Chest pain 01/26/2016   Past Medical History:  Diagnosis Date   Dysrhythmia    Headache    Hypertension    PAF (paroxysmal atrial fibrillation) (HCC)    Palpitations    Skin cancer     Family History  Problem Relation Age of Onset   Cancer Mother    Heart disease Father    Diabetes Father    Hypertension Father    Heart disease Brother    COPD Neg Hx    Stroke Neg Hx     Social History   Socioeconomic History   Marital status: Married    Spouse name: Not on file   Number of children: Not on file   Years of education: Not on file   Highest education level: Not on file  Occupational History   Occupation: partime truck driver  Tobacco Use   Smoking status: Never   Smokeless tobacco: Never  Vaping Use   Vaping status: Never Used  Substance and Sexual Activity   Alcohol use: No    Alcohol/week: 0.0 standard drinks of alcohol   Drug use: No   Sexual activity: Yes  Other Topics Concern   Not on file  Social History Narrative   Not on file   Social Determinants of Health   Financial Resource Strain: Low Risk  (  07/07/2022)   Overall Financial Resource Strain (CARDIA)    Difficulty of Paying Living Expenses: Not hard at all  Food Insecurity: No Food Insecurity (07/07/2022)   Hunger Vital Sign    Worried About Running Out of Food in the Last Year: Never true    Ran Out of Food in the Last Year: Never true  Transportation Needs: No Transportation Needs (07/07/2022)   PRAPARE - Administrator, Civil Service (Medical): No    Lack of Transportation (Non-Medical): No  Physical Activity: Insufficiently Active (07/07/2022)   Exercise Vital Sign    Days of Exercise per Week: 3 days    Minutes of Exercise per Session: 30 min  Stress: No Stress Concern Present  (07/07/2022)   [employer] of Occupational Health - Occupational Stress Questionnaire    Feeling of Stress : Not at all  Social Connections: Moderately Isolated (07/07/2022)   Social Connection and Isolation Panel [NHANES]    Frequency of Communication with Friends and Family: Twice a week    Frequency of Social Gatherings with Friends and Family: Never    Attends Religious Services: More than 4 times per year    Active Member of [employer] or Organizations: No    Attends Banker Meetings: Never    Marital Status: Married  Catering manager Violence: Not At Risk (07/07/2022)   Humiliation, Afraid, Rape, and Kick questionnaire    Fear of Current or Ex-Partner: No    Emotionally Abused: No    Physically Abused: No    Sexually Abused: No    ROS  General:  Negative for unexplained weight loss, fever Skin: Negative for new or changing mole, sore that won't heal HEENT: Negative for trouble hearing, trouble seeing, ringing in ears, mouth sores, hoarseness, change in voice, dysphagia. CV:  Negative for chest pain, dyspnea, edema, palpitations Resp: Negative for cough, dyspnea, hemoptysis GI: Negative for nausea, vomiting, diarrhea, constipation, abdominal pain, melena, hematochezia. GU: Negative for dysuria, incontinence, urinary hesitance, hematuria, vaginal or penile discharge, polyuria, sexual difficulty, lumps in testicle or breasts MSK: Negative for muscle cramps or aches, swelling Neuro: Negative for headaches, weakness, numbness, dizziness, passing out/fainting Psych: Negative for depression, anxiety, memory problems  Objective  Physical Exam Vitals:   01/27/23 1425  BP: 136/78  Pulse: 65  Temp: 98.1 F (36.7 C)  SpO2: 95%    BP Readings from Last 3 Encounters:  01/27/23 136/78  10/23/22 132/82  09/08/22 118/76   Wt Readings from Last 3 Encounters:  01/27/23 182 lb (82.6 kg)  10/23/22 183 lb (83 kg)  09/08/22 180 lb 8 oz (81.9 kg)    Physical  Exam Constitutional:      General: He is not in acute distress.    Appearance: Normal appearance.  HENT:     Head: Normocephalic.  Cardiovascular:     Rate and Rhythm: Normal rate and regular rhythm.     Heart sounds: Normal heart sounds.  Pulmonary:     Effort: Pulmonary effort is normal.     Breath sounds: Normal breath sounds.  Skin:    General: Skin is warm and dry.  Neurological:     General: No focal deficit present.     Mental Status: He is alert.  Psychiatric:        Mood and Affect: Mood normal.        Behavior: Behavior normal.    Assessment/Plan:   Chronic pain of right knee Assessment & Plan: He has been taking Meloxicam for  approximately three weeks with some relief and expressed interest in switching to Celebrex due to a previous positive experience. We will discontinue Meloxicam, start Celebrex, and plan to follow up in two weeks to assess efficacy and tolerability.  Orders: -     Celecoxib; Take 1 capsule (100 mg total) by mouth 2 (two) times daily.  Dispense: 180 capsule; Refill: 3  Essential hypertension Assessment & Plan: Blood pressure readings at home have been ranging from 130-140/78. He is currently on Toprol XL 50 mg and Lisinopril 20 mg, halving both doses due to feelings of sluggishness. We discussed the potential of Metoprolol causing fatigue due to its heart rate lowering effect. We will continue Toprol XL at the halved dose (25 mg daily) and increase Lisinopril to the full dose (20 mg daily), monitoring for any feelings of sluggishness. Encouraged to continue checking blood pressure at home and contact if remaining elevated.    Paroxysmal atrial fibrillation [facility]) Assessment & Plan: Managed by Cardiology. He experiences episodes every couple of months and self-medicates with Flecainide as needed. He is taking Xarelto daily. We will continue the current management plan and encourage follow up with Cardiology as scheduled.    Benign prostatic  hyperplasia, unspecified whether lower urinary tract symptoms present Assessment & Plan: He reports a weak urinary stream but no difficulty initiating urination or significant nocturia. Last PSA on 04/24/2022- WNL. We will continue to monitor.    Mixed hyperlipidemia Assessment & Plan: Stable at this time on Lipitor 10 mg daily. Continue. LDL- 70   Need for influenza vaccination -     Flu Vaccine Trivalent High Dose (Fluad)    Return in about 6 months (around 07/27/2023) for Follow up.   Bethanie Dicker, NP-C [facility] - [employer]

## 2023-02-03 ENCOUNTER — Encounter: Payer: Self-pay | Admitting: Nurse Practitioner

## 2023-02-03 NOTE — Assessment & Plan Note (Signed)
He has been taking Meloxicam for approximately three weeks with some relief and expressed interest in switching to Celebrex due to a previous positive experience. We will discontinue Meloxicam, start Celebrex, and plan to follow up in two weeks to assess efficacy and tolerability.

## 2023-02-03 NOTE — Assessment & Plan Note (Signed)
Managed by Cardiology. He experiences episodes every couple of months and self-medicates with Flecainide as needed. He is taking Xarelto daily. We will continue the current management plan and encourage follow up with Cardiology as scheduled.

## 2023-02-03 NOTE — Assessment & Plan Note (Signed)
Stable at this time on Lipitor 10 mg daily. Continue. LDL- 70

## 2023-02-03 NOTE — Assessment & Plan Note (Signed)
He reports a weak urinary stream but no difficulty initiating urination or significant nocturia. Last PSA on 04/24/2022- WNL. We will continue to monitor.

## 2023-02-03 NOTE — Assessment & Plan Note (Signed)
Blood pressure readings at home have been ranging from 130-140/78. He is currently on Toprol XL 50 mg and Lisinopril 20 mg, halving both doses due to feelings of sluggishness. We discussed the potential of Metoprolol causing fatigue due to its heart rate lowering effect. We will continue Toprol XL at the halved dose (25 mg daily) and increase Lisinopril to the full dose (20 mg daily), monitoring for any feelings of sluggishness. Encouraged to continue checking blood pressure at home and contact if remaining elevated.

## 2023-02-23 ENCOUNTER — Other Ambulatory Visit: Payer: Self-pay

## 2023-02-23 ENCOUNTER — Emergency Department: Payer: PPO

## 2023-02-23 ENCOUNTER — Emergency Department
Admission: EM | Admit: 2023-02-23 | Discharge: 2023-02-23 | Disposition: A | Discharge status: Home / Self Care | Payer: PPO | Attending: Emergency Medicine | Admitting: Emergency Medicine

## 2023-02-23 ENCOUNTER — Telehealth: Payer: Self-pay

## 2023-02-23 DIAGNOSIS — I2510 Atherosclerotic heart disease of native coronary artery without angina pectoris: Secondary | ICD-10-CM | POA: Insufficient documentation

## 2023-02-23 DIAGNOSIS — R200 Anesthesia of skin: Secondary | ICD-10-CM | POA: Diagnosis not present

## 2023-02-23 DIAGNOSIS — I10 Essential (primary) hypertension: Secondary | ICD-10-CM | POA: Diagnosis not present

## 2023-02-23 DIAGNOSIS — I480 Paroxysmal atrial fibrillation: Secondary | ICD-10-CM | POA: Diagnosis not present

## 2023-02-23 DIAGNOSIS — Z7901 Long term (current) use of anticoagulants: Secondary | ICD-10-CM | POA: Insufficient documentation

## 2023-02-23 DIAGNOSIS — J320 Chronic maxillary sinusitis: Secondary | ICD-10-CM | POA: Diagnosis not present

## 2023-02-23 DIAGNOSIS — E785 Hyperlipidemia, unspecified: Secondary | ICD-10-CM | POA: Insufficient documentation

## 2023-02-23 DIAGNOSIS — R202 Paresthesia of skin: Secondary | ICD-10-CM | POA: Diagnosis not present

## 2023-02-23 DIAGNOSIS — Z79899 Other long term (current) drug therapy: Secondary | ICD-10-CM | POA: Diagnosis not present

## 2023-02-23 LAB — CBC
HCT: 47.1 % (ref 39.0–52.0)
Hemoglobin: 16.4 g/dL (ref 13.0–17.0)
MCH: 33 pg (ref 26.0–34.0)
MCHC: 34.8 g/dL (ref 30.0–36.0)
MCV: 94.8 fL (ref 80.0–100.0)
Platelets: 249 10*3/uL (ref 150–400)
RBC: 4.97 MIL/uL (ref 4.22–5.81)
RDW: 13.2 % (ref 11.5–15.5)
WBC: 8.4 10*3/uL (ref 4.0–10.5)
nRBC: 0 % (ref 0.0–0.2)

## 2023-02-23 LAB — DIFFERENTIAL
Abs Immature Granulocytes: 0.02 10*3/uL (ref 0.00–0.07)
Basophils Absolute: 0.1 10*3/uL (ref 0.0–0.1)
Basophils Relative: 1 %
Eosinophils Absolute: 0.1 10*3/uL (ref 0.0–0.5)
Eosinophils Relative: 1 %
Immature Granulocytes: 0 %
Lymphocytes Relative: 22 %
Lymphs Abs: 1.8 10*3/uL (ref 0.7–4.0)
Monocytes Absolute: 0.6 10*3/uL (ref 0.1–1.0)
Monocytes Relative: 7 %
Neutro Abs: 5.8 10*3/uL (ref 1.7–7.7)
Neutrophils Relative %: 69 %

## 2023-02-23 LAB — COMPREHENSIVE METABOLIC PANEL
ALT: 22 U/L (ref 0–44)
AST: 21 U/L (ref 15–41)
Albumin: 4.2 g/dL (ref 3.5–5.0)
Alkaline Phosphatase: 67 U/L (ref 38–126)
Anion gap: 8 (ref 5–15)
BUN: 30 mg/dL — ABNORMAL HIGH (ref 8–23)
CO2: 23 mmol/L (ref 22–32)
Calcium: 8.7 mg/dL — ABNORMAL LOW (ref 8.9–10.3)
Chloride: 106 mmol/L (ref 98–111)
Creatinine, Ser: 1.37 mg/dL — ABNORMAL HIGH (ref 0.61–1.24)
GFR, Estimated: 54 mL/min — ABNORMAL LOW (ref >60)
Glucose, Bld: 111 mg/dL — ABNORMAL HIGH (ref 70–99)
Potassium: 4.3 mmol/L (ref 3.5–5.1)
Sodium: 137 mmol/L (ref 135–145)
Total Bilirubin: 0.9 mg/dL (ref <1.2)
Total Protein: 7.1 g/dL (ref 6.5–8.1)

## 2023-02-23 LAB — ETHANOL: Alcohol, Ethyl (B): <10 mg/dL (ref <10)

## 2023-02-23 MED ORDER — CLONIDINE HCL 0.1 MG PO TABS: 0.1000 mg | ORAL_TABLET | Freq: Once | ORAL | Status: DC

## 2023-02-23 NOTE — Telephone Encounter (Signed)
Spoke with patient and he advised that he has started seeing a new PCP at [facility]. I informed patient that I would make Dr. Laural Benes aware of the change. Patient acknowledged understanding.

## 2023-02-23 NOTE — ED Triage Notes (Signed)
Pt to ED for HTN and lips being numb at 0630 this am lasted 3-5 mins. Denies numbness or weakness currently. Ambulatory with steady gait. Clear speech. NAD noted.  Reports lips have been numb on 3 separate occasions this week.

## 2023-02-23 NOTE — Telephone Encounter (Signed)
LMOM advising patient to call office. Would like to know if patient has found a new PCP.

## 2023-02-23 NOTE — ED Provider Notes (Signed)
[facility] Provider Note    Event Date/Time   First MD Initiated Contact with Patient 02/23/23 1059     (approximate)  History   Chief Complaint: Numbness and Hypertension  HPI  Neil Bush is a 72 y.o. male with a past medical history of hypertension, hyperlipidemia, paroxysmal atrial fibrillation on Xarelto, presents to the emergency department for intermittent left lower lip numbness.  According to the patient over the past 1 week he has now had 3 episodes where his left lower lip will go numb for several minutes up to 30 minutes or so and then resolved.  Denies any headache during these episodes denies any weakness or numbness of any arm or leg.  Denies any history of stroke.  States he had an MRI August of last year showing no concerning findings but his symptoms just started this past week.  This morning the episode lasted for quite some time so the patient came to the emergency department for evaluation.  Patient noted to be quite hypertensive currently 202/98, patient does appear somewhat anxious, did take his lisinopril this morning.  States his normal blood pressure is around 140.  Physical Exam   Triage Vital Signs: ED Triage Vitals  Encounter Vitals Group     BP 02/23/23 1055 (!) 202/98     Systolic BP Percentile --      Diastolic BP Percentile --      Pulse Rate 02/23/23 1055 70     Resp 02/23/23 1055 18     Temp 02/23/23 1055 97.8 F (36.6 C)     Temp src --      SpO2 02/23/23 1055 97 %     Weight 02/23/23 1056 180 lb (81.6 kg)     Height 02/23/23 1056 5\' 10"  (1.778 m)     Head Circumference --      Peak Flow --      Pain Score 02/23/23 1055 0     Pain Loc --      Pain Education --      Exclude from Growth Chart --     Most recent vital signs: Vitals:   02/23/23 1055 02/23/23 1116  BP: (!) 202/98   Pulse: 70   Resp: 18   Temp: 97.8 F (36.6 C)   SpO2: 97% 97%    General: Awake, no distress.  CV:  Good peripheral perfusion.   Regular rate and rhythm  Resp:  Normal effort.  Equal breath sounds bilaterally.  Abd:  No distention.  Soft, nontender.  No rebound or guarding. Other:  Equal grip strength bilaterally.  No pronator drift.  Cranial nerves intact.  Denies any numbness currently.   ED Results / Procedures / Treatments   EKG  EKG viewed and interpreted by myself shows sinus bradycardia 56 bpm with a narrow QRS, normal axis, normal intervals, no concerning ST changes.  RADIOLOGY  I have reviewed and interpreted CT head images.  No bleed seen on my evaluation. Radiology is read the CT scan as negative sides maxillary sinusitis.   MEDICATIONS ORDERED IN ED: Medications - No data to display   IMPRESSION / MDM / ASSESSMENT AND PLAN / ED COURSE  I reviewed the triage vital signs and the nursing notes.  Patient's presentation is most consistent with acute presentation with potential threat to life or bodily function.  Patient presents to the emergency department for intermittent left lower lip numbness occurring over the past 1 week.  Denies any headache.  Denies any weakness or numbness of any arm or leg.  Reassuring physical exam.  Reassuring neurological exam.  Patient's lab work today shows a reassuring CBC, chemistry pending.  EKG reassuring.  Physical exam reassuring however given the patient's intermittent symptoms that seem to be worsening over this past 1 week I have discussed patient with Dr. Thad Ranger of neurology who will be down shortly to see the patient to help with further workup.  CT scan shows no significant findings.  Patient is unable unfortunately to obtain an MRI given shrapnel in his abdomen.  Even though he has received an MRI previously this was unknown to the radiologist prior to the MRI and they do not feel that the patient can undergo an MRI at this time.  I spoke to neurology they believe that the patient is already anticoagulated that the patient is safe for discharge home with  outpatient follow-up.  Remainder of the patient's workup is reassuring with a normal CBC, reassuring chemistry besides slight renal insufficiency and a negative alcohol level.  FINAL CLINICAL IMPRESSION(S) / ED DIAGNOSES   Paresthesias   Note:  This document was prepared using Dragon voice recognition software and may include unintentional dictation errors.   Minna Antis, MD 02/23/23 1446

## 2023-02-23 NOTE — Consult Note (Signed)
Requesting Physician: Paduchowski    Chief Complaint: Left lower lip numbness  I have been asked by Dr. Lenard Lance to see this patient in consultation for TIA.  HPI: Neil Bush is an 72 y.o. male with a history of HTN, HLD, PAF on Xarelto who presents with complaints of episodic left lower lip numbness.  The patient reports that he has had three episodes.  The first two occurred about a week ago and the last was earlier today.  Each lasted about 5-10 minutes and resolved spontaneously.  He is now at baseline.    Date last known well: Date: 02/23/2023 Time last known well: Time: 06:30 tPA Given: No: On Xarelto, complete resolution of symptoms  Past Medical History:  Diagnosis Date   Chest pain    a. 01/2016 in setting of Afib;  b. 02/2016 Ex MV: EF 55-65%, no ischemia.   Dysrhythmia    Headache    Hyperlipidemia    Hypertension    PAF (paroxysmal atrial fibrillation) (HCC)    a. 01/2016 - admission for rapid AF and c/p-->converted on IV dilt;  b. CHA2DS2VASc = 2-->Xarelto;  c. 01/2016 Echo: EF 55-60%, triv AI.   Palpitations    a. 08/2016 Zio Patch: symptomatic PAC's and brief (max 11 beats) of SVT/PAT (3 episodes).   Skin cancer     Past Surgical History:  Procedure Laterality Date   CARDIAC CATHETERIZATION     [facility]    CHOLECYSTECTOMY     COLONOSCOPY WITH PROPOFOL N/A 04/17/2017   Procedure: COLONOSCOPY WITH PROPOFOL;  Surgeon: Midge Minium, MD;  Location: [facility];  Service: Endoscopy;  Laterality: N/A;   COLONOSCOPY WITH PROPOFOL N/A 04/21/2022   Procedure: COLONOSCOPY WITH BIOPSY;  Surgeon: Midge Minium, MD;  Location: [facility];  Service: Endoscopy;  Laterality: N/A;   EYE SURGERY     FOOT SURGERY     HERNIA REPAIR     X 2 ingunial   KNEE SURGERY Right    POLYPECTOMY  04/17/2017   Procedure: POLYPECTOMY INTESTINAL;  Surgeon: Midge Minium, MD;  Location: [facility];  Service: Endoscopy;;   POLYPECTOMY  04/21/2022   Procedure: POLYPECTOMY;   Surgeon: Midge Minium, MD;  Location: [facility];  Service: Endoscopy;;    Family History  Problem Relation Age of Onset   Cancer Mother    Heart disease Father    Diabetes Father    Hypertension Father    Heart disease Brother    COPD Neg Hx    Stroke Neg Hx    Social History:  reports that he has never smoked. He has never used smokeless tobacco. He reports that he does not drink alcohol and does not use drugs.  Allergies:  Allergies  Allergen Reactions   Penicillins Rash    Has patient had a PCN reaction causing immediate rash, facial/tongue/throat swelling, SOB or lightheadedness with hypotension: no Has patient had a PCN reaction causing severe rash involving mucus membranes or skin necrosis: no Has patient had a PCN reaction that required hospitalization no Has patient had a PCN reaction occurring within the last 10 years: no If all of the above answers are "NO", then may proceed with Cephalosporin use.    Childhood allergy    Medications:  Prior to Admission medications   Medication Sig Start Date End Date Taking? Authorizing Provider  atorvastatin (LIPITOR) 10 MG tablet Take 1 tablet (10 mg total) by mouth daily. 10/23/22   Olevia Perches P, DO  celecoxib (CELEBREX) 100  MG capsule Take 1 capsule (100 mg total) by mouth 2 (two) times daily. 01/27/23   Bethanie Dicker, NP  flecainide (TAMBOCOR) 100 MG tablet Take 1 tablet (100 mg total) by mouth 2 (two) times daily as needed (daily PRN for atrial fibrillation). 06/15/20   Antonieta Iba, MD  lisinopril (ZESTRIL) 20 MG tablet Take 0.5 tablets (10 mg total) by mouth daily. 10/23/22   Olevia Perches P, DO  metoprolol succinate (TOPROL-XL) 50 MG 24 hr tablet Take 1 tablet (50 mg total) by mouth daily. Take with or immediately following a meal. 09/08/22   Gollan, Tollie Pizza, MD  rivaroxaban (XARELTO) 20 MG TABS tablet Take 1 tablet (20 mg total) by mouth daily. 10/23/22   Johnson, Megan P, DO     ROS: History obtained  from the patient  General ROS: negative for - chills, fatigue, fever, night sweats, weight gain or weight loss Psychological ROS: negative for - behavioral disorder, hallucinations, memory difficulties, mood swings or suicidal ideation Ophthalmic ROS: negative for - blurry vision, double vision, eye pain or loss of vision ENT ROS: negative for - epistaxis, nasal discharge, oral lesions, sore throat, tinnitus or vertigo Allergy and Immunology ROS: negative for - hives or itchy/watery eyes Hematological and Lymphatic ROS: negative for - bleeding problems, bruising or swollen lymph nodes Endocrine ROS: negative for - galactorrhea, hair pattern changes, polydipsia/polyuria or temperature intolerance Respiratory ROS: negative for - cough, hemoptysis, shortness of breath or wheezing Cardiovascular ROS: negative for - chest pain, dyspnea on exertion, edema or irregular heartbeat Gastrointestinal ROS: negative for - abdominal pain, diarrhea, hematemesis, nausea/vomiting or stool incontinence Genito-Urinary ROS: negative for - dysuria, hematuria, incontinence or urinary frequency/urgency Musculoskeletal ROS: negative for - joint swelling or muscular weakness Neurological ROS: as noted in HPI Dermatological ROS: negative for rash and skin lesion changes   Physical Examination: Blood pressure (!) 202/98, pulse 70, temperature 97.8 F (36.6 C), resp. rate 18, height 5\' 10"  (1.778 m), weight 81.6 kg, SpO2 97%.  HEENT-  Normocephalic, no lesions, without obvious abnormality.  Normal external eye and conjunctiva.  Normal TM's bilaterally.  Normal auditory canals and external ears. Normal external nose, mucus membranes and septum.  Normal pharynx. Cardiovascular-  Single S1, S2 , pulses palpable throughout   Lungs- clear to auscultation Abdomen- soft and nontender Extremities- no edema Musculoskeletal-no joint tenderness, deformity or swelling Skin-warm and dry  Neurological Examination   Mental  Status: Alert, oriented, thought content appropriate.  Speech fluent without evidence of aphasia.  Able to follow 3 step commands without difficulty. Cranial Nerves: II: Discs flat bilaterally; Visual fields grossly normal, pupils equal, round, reactive to light and accommodation III,IV, VI: ptosis not present, extra-ocular motions intact bilaterally V,VII: smile symmetric, facial light touch sensation normal bilaterally VIII: hearing normal bilaterally IX,X: gag reflex present XI: bilateral shoulder shrug XII: midline tongue extension Motor: Right : Upper extremity   5/5    Left:     Upper extremity   5/5  Lower extremity   5/5     Lower extremity   5/5 Tone and bulk:normal tone throughout; no atrophy noted Sensory: Pinprick and light touch intact throughout, bilaterally Deep Tendon Reflexes: Symmetric throughout Plantars: Right: downgoing   Left: downgoing Cerebellar: normal finger-to-nose and normal heel-to-shin testing bilaterally Gait: deferred   NIHSS of 0   Laboratory Studies:  Basic Metabolic Panel: Recent Labs  Lab 02/23/23 1055  NA 137  K 4.3  CL 106  CO2 23  GLUCOSE 111*  BUN  30*  CREATININE 1.37*  CALCIUM 8.7*    Liver Function Tests: Recent Labs  Lab 02/23/23 1055  AST 21  ALT 22  ALKPHOS 67  BILITOT 0.9  PROT 7.1  ALBUMIN 4.2   No results for input(s): "LIPASE", "AMYLASE" in the last 168 hours. No results for input(s): "AMMONIA" in the last 168 hours.  CBC: Recent Labs  Lab 02/23/23 1055  WBC 8.4  NEUTROABS 5.8  HGB 16.4  HCT 47.1  MCV 94.8  PLT 249    Cardiac Enzymes: No results for input(s): "CKTOTAL", "CKMB", "CKMBINDEX", "TROPONINI" in the last 168 hours.  BNP: Invalid input(s): "POCBNP"  CBG: No results for input(s): "GLUCAP" in the last 168 hours.  Microbiology: Results for orders placed or performed in visit on 04/24/22  Microscopic Examination     Status: Abnormal   Collection Time: 04/24/22  8:57 AM   Urine  Result  Value Ref Range Status   WBC, UA None seen 0 - 5 /hpf Final   RBC, Urine 0-2 0 - 2 /hpf Final   Epithelial Cells (non renal) None seen 0 - 10 /hpf Final   Mucus, UA Present (A) Not Estab. Final   Bacteria, UA None seen None seen/Few Final    Coagulation Studies: No results for input(s): "LABPROT", "INR" in the last 72 hours.  Urinalysis: No results for input(s): "COLORURINE", "LABSPEC", "PHURINE", "GLUCOSEU", "HGBUR", "BILIRUBINUR", "KETONESUR", "PROTEINUR", "UROBILINOGEN", "NITRITE", "LEUKOCYTESUR" in the last 168 hours.  Invalid input(s): "APPERANCEUR"  Lipid Panel:    Component Value Date/Time   CHOL 141 10/23/2022 0831   CHOL 135 10/15/2018 0812   TRIG 207 (H) 10/23/2022 0831   TRIG 118 10/15/2018 0812   HDL 37 (L) 10/23/2022 0831   CHOLHDL 2.8 03/29/2018 1459   VLDL 24 10/15/2018 0812   LDLCALC 70 10/23/2022 0831    HgbA1C:  Lab Results  Component Value Date   HGBA1C 5.0 10/06/2017    Urine Drug Screen:  No results found for: "LABOPIA", "COCAINSCRNUR", "LABBENZ", "AMPHETMU", "THCU", "LABBARB"  Alcohol Level:  Recent Labs  Lab 02/23/23 1055  ETH <10     Imaging: No results found.  Assessment: 72 y.o. male with a history of HTN, HLD, PAF on Xarelto who presents with complaints of episodic left lower lip numbness.  The patient reports that he has had three episodes.  The first two occurred about a week ago and the last was earlier today.  Each lasted about 5-10 minutes and resolved spontaneously.  He is now at baseline. Differential includes TIA versus early Bell's palsy.  Patient currently on Xarelto and compliant.  Symptoms minor and not disabling therefore would not be a thrombectomy candidate even if symptoms were persistent.  Suspect very small vessel etiology if vascular in nature.  BP also elevated on presentation but is improving.  May have been related to anxiety.  Did not check BP with previous events.    Stroke Risk Factors - atrial fibrillation,  hyperlipidemia, and hypertension  Plan: 1. Continue Xarelto and statin 2. MRI of the brain without contrast.  If no evidence of acute infarct may continue work up on an outpatient basis 3. Follow BP at home 4. If recurrent symptoms may take 81mg  ASA.  If symptoms more extensive to return to ED immediately.     Case discussed with Dr. Brita Romp, MD Neurology  02/23/2023, 12:04 PM

## 2023-03-09 ENCOUNTER — Telehealth: Payer: Self-pay

## 2023-03-09 ENCOUNTER — Other Ambulatory Visit: Payer: Self-pay | Admitting: Nurse Practitioner

## 2023-03-09 MED ORDER — RIVAROXABAN 20 MG PO TABS: 20.0000 mg | ORAL_TABLET | Freq: Every day | ORAL | 1 refills | Status: DC

## 2023-03-09 NOTE — Telephone Encounter (Signed)
 Copied from CRM [id number]. Topic: Clinical - Medication Refill >> Mar 09, 2023  9:33 AM Isabell A wrote: Most Recent Primary Care Visit:  Provider: GRETEL APP  Department: [facility]  Visit Type: NEW PATIENT  Date: 01/27/2023  Medication: rivaroxaban  (XARELTO ) 20 MG TABS tablet   Has the patient contacted their pharmacy? Yes (Agent: If no, request that the patient contact the pharmacy for the refill. If patient does not wish to contact the pharmacy document the reason why and proceed with request.) (Agent: If yes, when and what did the pharmacy advise?)  Is this the correct pharmacy for this prescription? Yes If no, delete pharmacy and type the correct one.  This is the patient's preferred pharmacy:  [facility] - [place], [place] - [address] [place] [address] Phone: [phone] Fax: [phone]   Has the prescription been filled recently? Yes  Is the patient out of the medication? No  Has the patient been seen for an appointment in the last year OR does the patient have an upcoming appointment? Yes  Can we respond through MyChart? No  Agent: Please be advised that Rx refills may take up to 3 business days. We ask that you follow-up with your pharmacy.

## 2023-03-09 NOTE — Addendum Note (Signed)
 Addended by: Donavan Foil on: 03/09/2023 11:38 AM   Modules accepted: Orders

## 2023-03-09 NOTE — Telephone Encounter (Signed)
 Refill sent.

## 2023-03-30 ENCOUNTER — Other Ambulatory Visit: Payer: Self-pay | Admitting: Family Medicine

## 2023-03-30 ENCOUNTER — Telehealth: Payer: Self-pay

## 2023-03-30 NOTE — Telephone Encounter (Signed)
Refilled last by his previous provider. Is it okay to refill?  Last OV: 01/27/2023 Next OV: 07/28/2023

## 2023-03-30 NOTE — Telephone Encounter (Signed)
Copied from CRM [id number]. Topic: Clinical - Medication Refill >> Mar 30, 2023 11:52 AM Aletta Edouard wrote: Most Recent Primary Care Visit:  Provider: Bethanie Dicker  Department: [facility]  Visit Type: NEW PATIENT  Date: 01/27/2023  Medication: lisinopril (ZESTRIL) 20 MG tablet  Has the patient contacted their pharmacy? No (Agent: If no, request that the patient contact the pharmacy for the refill. If patient does not wish to contact the pharmacy document the reason why and proceed with request.) (Agent: If yes, when and what did the pharmacy advise?)  Is this the correct pharmacy for this prescription? Yes If no, delete pharmacy and type the correct one.  This is the patient's preferred pharmacy:  [facility] - [place], [place] - [address] [place] [address] Phone: [phone] Fax: [phone]   Has the prescription been filled recently? No  Is the patient out of the medication? yes Has the patient been seen for an appointment in the last year OR does the patient have an upcoming appointment? Yes  Can we respond through MyChart? Yes  Agent: Please be advised that Rx refills may take up to 3 business days. We ask that you follow-up with your pharmacy.

## 2023-04-01 ENCOUNTER — Other Ambulatory Visit: Payer: Self-pay | Admitting: Nurse Practitioner

## 2023-04-01 DIAGNOSIS — I10 Essential (primary) hypertension: Secondary | ICD-10-CM

## 2023-04-01 MED ORDER — LISINOPRIL 20 MG PO TABS: 10.0000 mg | ORAL_TABLET | Freq: Every day | ORAL | 3 refills | Status: DC

## 2023-04-01 NOTE — Telephone Encounter (Signed)
Duplicate request- filled 04/01/23 #45 3RF- outside provider Requested Prescriptions  Pending Prescriptions Disp Refills   lisinopril (ZESTRIL) 20 MG tablet [Pharmacy Med Name: LISINOPRIL 20 MG TABLET] 45 tablet 0    Sig: Take 0.5 tablets (10 mg total) by mouth daily.     Cardiovascular:  ACE Inhibitors Failed - 04/01/2023 11:38 AM      Failed - Cr in normal range and within 180 days    Creatinine  Date Value Ref Range Status  10/30/2011 1.05 0.60 - 1.30 mg/dL Final   Creatinine, Ser  Date Value Ref Range Status  02/23/2023 1.37 (H) 0.61 - 1.24 mg/dL Final         Failed - Last BP in normal range    BP Readings from Last 1 Encounters:  02/23/23 (!) 164/82         Passed - K in normal range and within 180 days    Potassium  Date Value Ref Range Status  02/23/2023 4.3 3.5 - 5.1 mmol/L Final  10/30/2011 4.3 3.5 - 5.1 mmol/L Final         Passed - Patient is not pregnant      Passed - Valid encounter within last 6 months    Recent Outpatient Visits           5 months ago Paroxysmal atrial fibrillation (HCC)   [place] [facility] Francis, Megan P, DO   11 months ago Routine general medical examination at a health care facility   [facility] [place] [place] P, DO   1 year ago Mixed hyperlipidemia   [place] [facility] Parker, Megan P, DO   1 year ago Mixed hyperlipidemia   [facility] [place], Megan P, DO   1 year ago Routine general medical examination at a health care facility   [facility] [name], DO       Future Appointments             In 3 weeks [name], DO  [facility]   In 3 months Bethanie Dicker, NP [facility] [place], [facility]

## 2023-04-03 ENCOUNTER — Other Ambulatory Visit: Payer: Self-pay | Admitting: Nurse Practitioner

## 2023-04-03 DIAGNOSIS — I10 Essential (primary) hypertension: Secondary | ICD-10-CM

## 2023-04-20 DIAGNOSIS — R202 Paresthesia of skin: Secondary | ICD-10-CM | POA: Diagnosis not present

## 2023-04-20 DIAGNOSIS — R200 Anesthesia of skin: Secondary | ICD-10-CM | POA: Diagnosis not present

## 2023-04-20 DIAGNOSIS — Z8679 Personal history of other diseases of the circulatory system: Secondary | ICD-10-CM | POA: Diagnosis not present

## 2023-04-21 ENCOUNTER — Other Ambulatory Visit: Payer: Self-pay | Admitting: Student

## 2023-04-21 DIAGNOSIS — Z8679 Personal history of other diseases of the circulatory system: Secondary | ICD-10-CM

## 2023-04-21 DIAGNOSIS — R200 Anesthesia of skin: Secondary | ICD-10-CM

## 2023-04-28 ENCOUNTER — Encounter: Payer: Self-pay | Admitting: Family Medicine

## 2023-05-08 ENCOUNTER — Telehealth: Payer: Self-pay | Admitting: Nurse Practitioner

## 2023-05-08 NOTE — Telephone Encounter (Signed)
 Copied from CRM [id number]. Topic: Medicare AWV >> May 08, 2023  2:52 PM Payton Doughty wrote: Reason for CRM: Called 05/08/2023 to r/s AWV due to schedule with wrong office. New AWV date 07/15/2023 at 8:10am. Please confirm date change [facility]  Verlee Rossetti; Care Guide Ambulatory Clinical Support [place] [facility] Direct Dial: [phone]

## 2023-05-11 ENCOUNTER — Ambulatory Visit
Admission: RE | Admit: 2023-05-11 | Discharge: 2023-05-11 | Disposition: A | Discharge status: Home / Self Care | Payer: PPO | Source: Ambulatory Visit | Attending: Student | Admitting: Student

## 2023-05-11 DIAGNOSIS — R202 Paresthesia of skin: Secondary | ICD-10-CM | POA: Diagnosis not present

## 2023-05-11 DIAGNOSIS — Z8679 Personal history of other diseases of the circulatory system: Secondary | ICD-10-CM

## 2023-05-11 MED ORDER — GADOPICLENOL 0.5 MMOL/ML IV SOLN
7.5000 mL | Freq: Once | INTRAVENOUS | Status: AC | PRN
  Administered 2023-05-11: 7.5 mL via INTRAVENOUS

## 2023-05-18 DIAGNOSIS — D485 Neoplasm of uncertain behavior of skin: Secondary | ICD-10-CM | POA: Diagnosis not present

## 2023-05-18 DIAGNOSIS — D225 Melanocytic nevi of trunk: Secondary | ICD-10-CM | POA: Diagnosis not present

## 2023-05-18 DIAGNOSIS — D2262 Melanocytic nevi of left upper limb, including shoulder: Secondary | ICD-10-CM | POA: Diagnosis not present

## 2023-05-18 DIAGNOSIS — C44319 Basal cell carcinoma of skin of other parts of face: Secondary | ICD-10-CM | POA: Diagnosis not present

## 2023-05-18 DIAGNOSIS — L570 Actinic keratosis: Secondary | ICD-10-CM | POA: Diagnosis not present

## 2023-05-18 DIAGNOSIS — L821 Other seborrheic keratosis: Secondary | ICD-10-CM | POA: Diagnosis not present

## 2023-05-18 DIAGNOSIS — L820 Inflamed seborrheic keratosis: Secondary | ICD-10-CM | POA: Diagnosis not present

## 2023-05-18 DIAGNOSIS — D235 Other benign neoplasm of skin of trunk: Secondary | ICD-10-CM | POA: Diagnosis not present

## 2023-05-18 DIAGNOSIS — Z08 Encounter for follow-up examination after completed treatment for malignant neoplasm: Secondary | ICD-10-CM | POA: Diagnosis not present

## 2023-05-18 DIAGNOSIS — Z85828 Personal history of other malignant neoplasm of skin: Secondary | ICD-10-CM | POA: Diagnosis not present

## 2023-05-18 DIAGNOSIS — D2261 Melanocytic nevi of right upper limb, including shoulder: Secondary | ICD-10-CM | POA: Diagnosis not present

## 2023-05-18 DIAGNOSIS — C44329 Squamous cell carcinoma of skin of other parts of face: Secondary | ICD-10-CM | POA: Diagnosis not present

## 2023-05-27 DIAGNOSIS — C44329 Squamous cell carcinoma of skin of other parts of face: Secondary | ICD-10-CM | POA: Diagnosis not present

## 2023-06-01 DIAGNOSIS — Z8679 Personal history of other diseases of the circulatory system: Secondary | ICD-10-CM | POA: Diagnosis not present

## 2023-06-01 DIAGNOSIS — R200 Anesthesia of skin: Secondary | ICD-10-CM | POA: Diagnosis not present

## 2023-06-01 DIAGNOSIS — R202 Paresthesia of skin: Secondary | ICD-10-CM | POA: Diagnosis not present

## 2023-06-30 DIAGNOSIS — C44319 Basal cell carcinoma of skin of other parts of face: Secondary | ICD-10-CM | POA: Diagnosis not present

## 2023-07-15 ENCOUNTER — Ambulatory Visit (INDEPENDENT_AMBULATORY_CARE_PROVIDER_SITE_OTHER): Admitting: *Deleted

## 2023-07-15 VITALS — BP 127/71 | HR 64 | Ht 68.5 in | Wt 180.0 lb

## 2023-07-15 DIAGNOSIS — Z0000 Encounter for general adult medical examination without abnormal findings: Secondary | ICD-10-CM | POA: Diagnosis not present

## 2023-07-15 NOTE — Patient Instructions (Signed)
 Mr. Neil Bush , Thank you for taking time out of your busy schedule to complete your Annual Wellness Visit with me. I enjoyed our conversation and look forward to speaking with you again next year. I, as well as your care team,  appreciate your ongoing commitment to your health goals. Please review the following plan we discussed and let me know if I can assist you in the future. Your Game plan/ To Do List    Referrals: If you haven't heard from the office you've been referred to, please reach out to them at the phone provided.   Remember to update your shingles and covid vaccines.  Follow up Visits: Next Medicare AWV with our clinical staff: 07/15/24 @ 8:10   Have you seen your provider in the last 6 months (3 months if uncontrolled diabetes)? Yes Next Office Visit with your provider: 07/28/23  Clinician Recommendations:  Aim for 30 minutes of exercise or brisk walking, 6-8 glasses of water , and 5 servings of fruits and vegetables each day.        This is a list of the screening recommended for you and due dates:  Health Maintenance  Topic Date Due   Zoster (Shingles) Vaccine (1 of 2) 08/06/1998   COVID-19 Vaccine (4 - 2024-25 season) 11/02/2022   Flu Shot  10/02/2023   Medicare Annual Wellness Visit  07/14/2024   Colon Cancer Screening  04/21/2025   DTaP/Tdap/Td vaccine (3 - Td or Tdap) 03/30/2029   Pneumonia Vaccine  Completed   Hepatitis C Screening  Completed   HPV Vaccine  Aged Out   Meningitis B Vaccine  Aged Out    Advanced directives: (Copy Requested) Please bring a copy of your health care power of attorney and living will to the office to be added to your chart at your convenience. You can mail to [facility] [address] [place], [place] [address] or email to [email or web address] Advance Care Planning is important because it:  [x]  Makes sure you receive the medical care that is consistent with your values, goals, and preferences  [x]  It provides  guidance to your family and loved ones and reduces their decisional burden about whether or not they are making the right decisions based on your wishes.

## 2023-07-15 NOTE — Progress Notes (Signed)
 Subjective:   Neil Bush is a 73 y.o. who presents for a Medicare Wellness preventive visit.  As a reminder, Annual Wellness Visits don't include a physical exam, and some assessments may be limited, especially if this visit is performed virtually. We may recommend an in-person visit if needed.  Visit Complete: Virtual I connected with  Neil Bush on 07/15/23 by a audio enabled telemedicine application and verified that I am speaking with the correct person using two identifiers.  Patient Location: Home  Provider Location: Home Office  I discussed the limitations of evaluation and management by telemedicine. The patient expressed understanding and agreed to proceed.  Vital Signs: Because this visit was a virtual/telehealth visit, some criteria may be missing or patient reported. Any vitals not documented were not able to be obtained and vitals that have been documented are patient reported.  VideoDeclined- This patient declined Librarian, academic. Therefore the visit was completed with audio only.  Persons Participating in Visit: Patient.  AWV Questionnaire: No: Patient Medicare AWV questionnaire was not completed prior to this visit.  Cardiac Risk Factors include: advanced age (>55men, >5 women);male gender;dyslipidemia;hypertension     Objective:     Today's Vitals   07/15/23 0813  BP: 127/71  Pulse: 64  Weight: 180 lb (81.6 kg)  Height: 5' 8.5" (1.74 m)   Body mass index is 26.97 kg/m.     07/15/2023    8:26 AM 02/23/2023   10:57 AM 07/07/2022    8:32 AM 04/21/2022    7:03 AM 05/30/2021    9:04 AM 05/04/2020    9:01 AM 12/31/2018   10:48 AM  Advanced Directives  Does Patient Have a Medical Advance Directive? Yes Yes No Yes Yes Yes Yes  Type of Estate agent of [place];Living will   Healthcare Power of [place];Living will Healthcare Power of [employer] of [place];Living will Healthcare Power of  [place];Living will  Does patient want to make changes to medical advance directive?    No - Patient declined   No - Patient declined  Copy of Healthcare Power of Attorney in Chart? No - copy requested   No - copy requested No - copy requested No - copy requested No - copy requested  Would patient like information on creating a medical advance directive?   No - Patient declined  No - Patient declined  No - Patient declined    Current Medications (verified) Outpatient Encounter Medications as of 07/15/2023  Medication Sig   atorvastatin  (LIPITOR) 10 MG tablet Take 1 tablet (10 mg total) by mouth daily.   lisinopril  (ZESTRIL ) 20 MG tablet Take 1 tablet (20 mg total) by mouth daily.   meloxicam  (MOBIC ) 15 MG tablet Take 15 mg by mouth daily.   metoprolol  succinate (TOPROL -XL) 50 MG 24 hr tablet Take 1 tablet (50 mg total) by mouth daily. Take with or immediately following a meal.   rivaroxaban  (XARELTO ) 20 MG TABS tablet Take 1 tablet (20 mg total) by mouth daily.   flecainide  (TAMBOCOR ) 100 MG tablet Take 1 tablet (100 mg total) by mouth 2 (two) times daily as needed (daily PRN for atrial fibrillation). (Patient not taking: Reported on 07/15/2023)   [DISCONTINUED] celecoxib  (CELEBREX ) 100 MG capsule Take 1 capsule (100 mg total) by mouth 2 (two) times daily. (Patient not taking: Reported on 07/15/2023)   Facility-Administered Encounter Medications as of 07/15/2023  Medication   triamcinolone  acetonide (KENALOG -40) injection 20 mg    Allergies (verified)  Penicillins   History: Past Medical History:  Diagnosis Date   Chest pain    a. 01/2016 in setting of Afib;  b. 02/2016 Ex MV: EF 55-65%, no ischemia.   Dysrhythmia    Headache    Hyperlipidemia    Hypertension    PAF (paroxysmal atrial fibrillation) (HCC)    a. 01/2016 - admission for rapid AF and c/p-->converted on IV dilt;  b. CHA2DS2VASc = 2-->Xarelto ;  c. 01/2016 Echo: EF 55-60%, triv AI.   Palpitations    a. 08/2016 Zio Patch:  symptomatic PAC's and brief (max 11 beats) of SVT/PAT (3 episodes).   Skin cancer    Past Surgical History:  Procedure Laterality Date   CARDIAC CATHETERIZATION     [facility]    CHOLECYSTECTOMY     COLONOSCOPY WITH PROPOFOL  N/A 04/17/2017   Procedure: COLONOSCOPY WITH PROPOFOL ;  Surgeon: Marnee Sink, MD;  Location: [facility];  Service: Endoscopy;  Laterality: N/A;   COLONOSCOPY WITH PROPOFOL  N/A 04/21/2022   Procedure: COLONOSCOPY WITH BIOPSY;  Surgeon: Marnee Sink, MD;  Location: [facility];  Service: Endoscopy;  Laterality: N/A;   EYE SURGERY     FOOT SURGERY     HERNIA REPAIR     X 2 ingunial   KNEE SURGERY Right    POLYPECTOMY  04/17/2017   Procedure: POLYPECTOMY INTESTINAL;  Surgeon: Marnee Sink, MD;  Location: [facility];  Service: Endoscopy;;   POLYPECTOMY  04/21/2022   Procedure: POLYPECTOMY;  Surgeon: Marnee Sink, MD;  Location: [facility];  Service: Endoscopy;;   Family History  Problem Relation Age of Onset   Cancer Mother    Heart disease Father    Diabetes Father    Hypertension Father    Heart disease Brother    COPD Neg Hx    Stroke Neg Hx    Social History   Socioeconomic History   Marital status: Married    Spouse name: Not on file   Number of children: Not on file   Years of education: Not on file   Highest education level: Not on file  Occupational History   Occupation: partime truck driver  Tobacco Use   Smoking status: Never   Smokeless tobacco: Never  Vaping Use   Vaping status: Never Used  Substance and Sexual Activity   Alcohol use: No    Alcohol/week: 0.0 standard drinks of alcohol   Drug use: No   Sexual activity: Yes  Other Topics Concern   Not on file  Social History Narrative   married   Social Drivers of Corporate investment banker Strain: Low Risk  (07/15/2023)   Overall Financial Resource Strain (CARDIA)    Difficulty of Paying Living Expenses: Not hard at all  Food Insecurity: No Food  Insecurity (07/15/2023)   Hunger Vital Sign    Worried About Running Out of Food in the Last Year: Never true    Ran Out of Food in the Last Year: Never true  Transportation Needs: No Transportation Needs (07/15/2023)   PRAPARE - Administrator, Civil Service (Medical): No    Lack of Transportation (Non-Medical): No  Physical Activity: Inactive (07/15/2023)   Exercise Vital Sign    Days of Exercise per Week: 0 days    Minutes of Exercise per Session: 0 min  Stress: No Stress Concern Present (07/15/2023)   [employer] of Occupational Health - Occupational Stress Questionnaire    Feeling of Stress : Not at all  Social Connections:  Moderately Integrated (07/15/2023)   Social Connection and Isolation Panel [NHANES]    Frequency of Communication with Friends and Family: Three times a week    Frequency of Social Gatherings with Friends and Family: More than three times a week    Attends Religious Services: More than 4 times per year    Active Member of [employer] or Organizations: No    Attends Engineer, structural: Never    Marital Status: Married    Tobacco Counseling Counseling given: Not Answered    Clinical Intake:  Pre-visit preparation completed: Yes  Pain : No/denies pain     BMI - recorded: 26.97 Nutritional Status: BMI 25 -29 Overweight Nutritional Risks: None Diabetes: No  Lab Results  Component Value Date   HGBA1C 5.0 10/06/2017   HGBA1C  09/23/2008    5.1 (NOTE) The ADA recommends the following therapeutic goal for glycemic control related to Hgb A1c measurement: Goal of therapy: <6.5 Hgb A1c  Reference: American Diabetes Association: Clinical Practice Recommendations 2010, Diabetes Care, 2010, 33: (Suppl  1).     How often do you need to have someone help you when you read instructions, pamphlets, or other written materials from your doctor or pharmacy?: 1 - Never  Interpreter Needed?: No  Information entered by :: R. Jemima Petko  LPN   Activities of Daily Living     07/15/2023    8:15 AM  In your present state of health, do you have any difficulty performing the following activities:  Hearing? 1  Comment a little  Vision? 0  Comment readers  Difficulty concentrating or making decisions? 0  Walking or climbing stairs? 0  Dressing or bathing? 0  Doing errands, shopping? 0  Preparing Food and eating ? N  Using the Toilet? N  In the past six months, have you accidently leaked urine? N  Do you have problems with loss of bowel control? N  Managing your Medications? N  Managing your Finances? N  Housekeeping or managing your Housekeeping? N    Patient Care Team: Bluford Burkitt, NP as PCP - General (Nurse Practitioner) Devorah Fonder, MD as PCP - Cardiology (Cardiology) Rogers Clayman, MD as Referring Physician (Otolaryngology) Clemetine Cypher, DPM as Consulting Physician (Podiatry) Clair Crews, MD as Referring Physician (Ophthalmology) Marquita Situ Magali Schmitz, MD (General Surgery) Deveron Fly, MD (Inactive) as Consulting Physician (Gastroenterology) Linn Rich, [facility] (Pharmacist)  Indicate any recent Medical Services you may have received from other than [facility] providers in the past year (date may be approximate).     Assessment:    This is a routine wellness examination for Neil Bush.  Hearing/Vision screen Hearing Screening - Comments:: A little difficulty Vision Screening - Comments:: readers   Goals Addressed             This Visit's Progress    Patient Stated       Wants to continue be active and stay healthy       Depression Screen     07/15/2023    8:21 AM 01/27/2023    2:32 PM 10/23/2022    8:15 AM 07/07/2022    8:30 AM 04/24/2022    8:29 AM 11/21/2021    8:44 AM 10/17/2021    8:29 AM  PHQ 2/9 Scores  PHQ - 2 Score 0 0 0 0 0 0 0  PHQ- 9 Score 0 0 0 0 0 0 1    Fall Risk     07/15/2023    8:17 AM 01/27/2023  2:31 PM 10/23/2022    8:15 AM 07/07/2022    8:32 AM  04/24/2022    8:29 AM  Fall Risk   Falls in the past year? 0 0 0 0 0  Number falls in past yr: 0 0 0 0 0  Injury with Fall? 0 0 0 0 0  Risk for fall due to : No Fall Risks No Fall Risks No Fall Risks No Fall Risks No Fall Risks  Follow up Falls prevention discussed;Falls evaluation completed Falls evaluation completed Falls evaluation completed Falls prevention discussed;Falls evaluation completed Falls evaluation completed    MEDICARE RISK AT HOME:  Medicare Risk at Home Any stairs in or around the home?: Yes If so, are there any without handrails?: No Home free of loose throw rugs in walkways, pet beds, electrical cords, etc?: Yes Adequate lighting in your home to reduce risk of falls?: Yes Life alert?: No Use of a cane, walker or w/c?: No Grab bars in the bathroom?: No Shower chair or bench in shower?: No Elevated toilet seat or a handicapped toilet?: Yes  TIMED UP AND GO:  Was the test performed?  No  Cognitive Function: 6CIT completed        07/15/2023    8:26 AM 07/07/2022    8:35 AM 05/04/2020    9:04 AM 03/31/2019    3:30 PM  6CIT Screen  What Year? 0 points 0 points 0 points 0 points  What month? 0 points 0 points 0 points 0 points  What time? 0 points 0 points 0 points 0 points  Count back from 20 0 points 0 points 0 points 0 points  Months in reverse 0 points 0 points 0 points 0 points  Repeat phrase 0 points 0 points 0 points 2 points  Total Score 0 points 0 points 0 points 2 points    Immunizations Immunization History  Administered Date(s) Administered   Fluad Quad(high Dose 65+) 04/18/2021   Fluad Trivalent(High Dose 65+) 01/27/2023   Influenza, High Dose Seasonal PF 01/21/2016   Influenza,inj,Quad PF,6+ Mos 12/26/2014, 01/01/2017   Influenza,inj,quad, With Preservative 01/20/2018, 12/14/2018   Influenza-Unspecified 01/20/2018, 12/18/2021   Moderna Sars-Covid-2 Vaccination 05/02/2019, 05/23/2019, 01/05/2020   Pneumococcal Conjugate-13 12/26/2014    Pneumococcal Polysaccharide-23 01/21/2016   Td 03/31/2019   Tdap 11/02/2008   Zoster, Live 11/12/2010    Screening Tests Health Maintenance  Topic Date Due   Zoster Vaccines- Shingrix (1 of 2) 08/06/1998   COVID-19 Vaccine (4 - 2024-25 season) 11/02/2022   Medicare Annual Wellness (AWV)  07/07/2023   INFLUENZA VACCINE  10/02/2023   Colonoscopy  04/21/2025   DTaP/Tdap/Td (3 - Td or Tdap) 03/30/2029   Pneumonia Vaccine 68+ Years old  Completed   Hepatitis C Screening  Completed   HPV VACCINES  Aged Out   Meningococcal B Vaccine  Aged Out    Health Maintenance  Health Maintenance Due  Topic Date Due   Zoster Vaccines- Shingrix (1 of 2) 08/06/1998   COVID-19 Vaccine (4 - 2024-25 season) 11/02/2022   Medicare Annual Wellness (AWV)  07/07/2023   Health Maintenance Items Addressed: Discussed the need to update shingles and covid vaccines.  Additional Screening:  Vision Screening: Recommended annual ophthalmology exams for early detection of glaucoma and other disorders of the eye. Up to date [facility]  Dental Screening: Recommended annual dental exams for proper oral hygiene  Community Resource Referral / Chronic Care Management: CRR required this visit?  No   CCM required this visit?  No  Plan:    I have personally reviewed and noted the following in the patient's chart:   Medical and social history Use of alcohol, tobacco or illicit drugs  Current medications and supplements including opioid prescriptions. Patient is not currently taking opioid prescriptions. Functional ability and status Nutritional status Physical activity Advanced directives List of other physicians Hospitalizations, surgeries, and ER visits in previous 12 months Vitals Screenings to include cognitive, depression, and falls Referrals and appointments  In addition, I have reviewed and discussed with patient certain preventive protocols, quality metrics, and best practice recommendations.  A written personalized care plan for preventive services as well as general preventive health recommendations were provided to patient.   Felicitas Horse, LPN   10/30/5619   After Visit Summary: (Pick Up) Due to this being a telephonic visit, with patients personalized plan was offered to patient and patient has requested to Pick up at office.  Notes: Nothing significant to report at this time.

## 2023-07-28 ENCOUNTER — Ambulatory Visit: Payer: Self-pay | Admitting: Nurse Practitioner

## 2023-07-28 ENCOUNTER — Encounter: Payer: Self-pay | Admitting: Nurse Practitioner

## 2023-07-28 ENCOUNTER — Ambulatory Visit (INDEPENDENT_AMBULATORY_CARE_PROVIDER_SITE_OTHER): Payer: PPO | Admitting: Nurse Practitioner

## 2023-07-28 VITALS — BP 126/88 | HR 53 | Temp 98.3°F | Ht 68.5 in | Wt 180.2 lb

## 2023-07-28 DIAGNOSIS — I480 Paroxysmal atrial fibrillation: Secondary | ICD-10-CM

## 2023-07-28 DIAGNOSIS — G8929 Other chronic pain: Secondary | ICD-10-CM

## 2023-07-28 DIAGNOSIS — I10 Essential (primary) hypertension: Secondary | ICD-10-CM

## 2023-07-28 DIAGNOSIS — N400 Enlarged prostate without lower urinary tract symptoms: Secondary | ICD-10-CM | POA: Diagnosis not present

## 2023-07-28 DIAGNOSIS — M25561 Pain in right knee: Secondary | ICD-10-CM | POA: Diagnosis not present

## 2023-07-28 DIAGNOSIS — E782 Mixed hyperlipidemia: Secondary | ICD-10-CM

## 2023-07-28 LAB — COMPREHENSIVE METABOLIC PANEL WITH GFR
ALT: 15 U/L (ref 0–53)
AST: 15 U/L (ref 0–37)
Albumin: 4.1 g/dL (ref 3.5–5.2)
Alkaline Phosphatase: 67 U/L (ref 39–117)
BUN: 24 mg/dL — ABNORMAL HIGH (ref 6–23)
CO2: 29 meq/L (ref 19–32)
Calcium: 8.8 mg/dL (ref 8.4–10.5)
Chloride: 103 meq/L (ref 96–112)
Creatinine, Ser: 1.14 mg/dL (ref 0.40–1.50)
GFR: 63.15 mL/min (ref >60.00)
Glucose, Bld: 96 mg/dL (ref 70–99)
Potassium: 4.8 meq/L (ref 3.5–5.1)
Sodium: 139 meq/L (ref 135–145)
Total Bilirubin: 1.1 mg/dL (ref 0.2–1.2)
Total Protein: 6.3 g/dL (ref 6.0–8.3)

## 2023-07-28 LAB — LIPID PANEL
Cholesterol: 131 mg/dL (ref 0–200)
HDL: 39.8 mg/dL (ref >39.00)
LDL Cholesterol: 66 mg/dL (ref 0–99)
NonHDL: 91.25
Total CHOL/HDL Ratio: 3
Triglycerides: 125 mg/dL (ref 0.0–149.0)
VLDL: 25 mg/dL (ref 0.0–40.0)

## 2023-07-28 LAB — PSA: PSA: 0.58 ng/mL (ref 0.10–4.00)

## 2023-07-28 MED ORDER — ATORVASTATIN CALCIUM 10 MG PO TABS: 10.0000 mg | ORAL_TABLET | Freq: Every day | ORAL | 3 refills | Status: DC

## 2023-07-28 MED ORDER — MELOXICAM 15 MG PO TABS
15.0000 mg | ORAL_TABLET | Freq: Every day | ORAL | 1 refills | Status: AC | PRN
Start: 2023-07-28 — End: ?

## 2023-07-28 NOTE — Assessment & Plan Note (Signed)
 He continues Lipitor for cholesterol management with no new concerns. Continue Lipitor 10 mg daily. Check lipid panel today.

## 2023-07-28 NOTE — Assessment & Plan Note (Signed)
 Managed by Cardiology. He experiences episodes every couple of months and self-medicates with Flecainide as needed. He is taking Xarelto daily. We will continue the current management plan and encourage follow up with Cardiology as scheduled.

## 2023-07-28 NOTE — Progress Notes (Signed)
 Bluford Burkitt, NP-C Phone: [phone]  Neil Bush is a 73 y.o. male who presents today for follow up.   Discussed the use of AI scribe software for clinical note transcription with the patient, who gave verbal consent to proceed.  History of Present Illness   Neil Bush is a 73 year old male with hypertension and atrial fibrillation who presents for a routine follow-up visit.  He has experienced numbness and tingling, which were evaluated by neurology. An MRI of the brain was performed and was normal. He was prescribed gabapentin, which he took for three days, resulting in cessation of symptoms. He has not experienced any numbness or tingling since and is no longer taking gabapentin.  He monitors his blood pressure at home and notes variability, with readings being high at home and low during office visits. He is currently taking lisinopril  20 mg daily and metoprolol  (Toprol  XL) 25 mg, which he has adjusted to half a tablet due to feeling sluggish on a full 50 mg tablet. No chest pain, shortness of breath, dizziness, or swelling in his legs.  He has a history of knee pain, which he describes as improving but still persistent. He was switched from meloxicam  to Celebrex  but found meloxicam  more effective. He has decided to return to meloxicam  15 mg daily, which he takes every day.  He is scheduled to see cardiology for his atrial fibrillation in July. He continues to take Lipitor for cholesterol management.  He spends a significant amount of time outdoors and is involved in gardening activities.      Social History   Tobacco Use  Smoking Status Never  Smokeless Tobacco Never    Current Outpatient Medications on File Prior to Visit  Medication Sig Dispense Refill   flecainide  (TAMBOCOR ) 100 MG tablet Take 1 tablet (100 mg total) by mouth 2 (two) times daily as needed (daily PRN for atrial fibrillation). 90 tablet 3   lisinopril  (ZESTRIL ) 20 MG tablet Take 1 tablet (20 mg total)  by mouth daily. 90 tablet 3   metoprolol  succinate (TOPROL -XL) 50 MG 24 hr tablet Take 1 tablet (50 mg total) by mouth daily. Take with or immediately following a meal. 90 tablet 3   rivaroxaban  (XARELTO ) 20 MG TABS tablet Take 1 tablet (20 mg total) by mouth daily. 90 tablet 1   Current Facility-Administered Medications on File Prior to Visit  Medication Dose Route Frequency Provider Last Rate Last Admin   triamcinolone  acetonide (KENALOG -40) injection 20 mg  20 mg Other Once [place], Max T, DPM         ROS see history of present illness  Objective  Physical Exam Vitals:   07/28/23 0817  BP: 126/88  Pulse: (!) 53  Temp: 98.3 F (36.8 C)  SpO2: 92%    BP Readings from Last 3 Encounters:  07/28/23 126/88  07/15/23 127/71  02/23/23 (!) 164/82   Wt Readings from Last 3 Encounters:  07/28/23 180 lb 3.2 oz (81.7 kg)  07/15/23 180 lb (81.6 kg)  02/23/23 180 lb (81.6 kg)    Physical Exam Constitutional:      General: He is not in acute distress.    Appearance: Normal appearance.  HENT:     Head: Normocephalic.  Cardiovascular:     Rate and Rhythm: Normal rate and regular rhythm.     Heart sounds: Normal heart sounds.  Pulmonary:     Effort: Pulmonary effort is normal.     Breath sounds: Normal breath sounds.  Skin:    General: Skin is warm and dry.  Neurological:     General: No focal deficit present.     Mental Status: He is alert.  Psychiatric:        Mood and Affect: Mood normal.        Behavior: Behavior normal.      Assessment/Plan: Please see individual problem list.  Essential hypertension Assessment & Plan: Blood pressure is controlled at 126/88 mmHg with lisinopril  and metoprolol . Continue lisinopril  20 mg daily and Toprol  XL 25 mg daily. Check CMP today.   Orders: -     Comprehensive metabolic panel with GFR  Mixed hyperlipidemia Assessment & Plan: He continues Lipitor for cholesterol management with no new concerns. Continue Lipitor 10 mg daily.  Check lipid panel today.  Orders: -     Lipid panel -     Atorvastatin  Calcium ; Take 1 tablet (10 mg total) by mouth daily.  Dispense: 90 tablet; Refill: 3  Paroxysmal atrial fibrillation [facility]) Assessment & Plan: Managed by Cardiology. He experiences episodes every couple of months and self-medicates with Flecainide  as needed. He is taking Xarelto  daily. We will continue the current management plan and encourage follow up with Cardiology as scheduled.    Chronic pain of right knee Assessment & Plan: Chronic knee pain is managed with meloxicam , which he prefers over Celebrex . No new symptoms. Discontinue Celebrex  and restart meloxicam  15 mg daily as needed.   Orders: -     Meloxicam ; Take 1 tablet (15 mg total) by mouth daily as needed for pain.  Dispense: 90 tablet; Refill: 1  Benign prostatic hyperplasia, unspecified whether lower urinary tract symptoms present -     PSA     Return in about 6 months (around 01/28/2024) for Follow up.   Bluford Burkitt, NP-C [facility]

## 2023-07-28 NOTE — Assessment & Plan Note (Signed)
 Blood pressure is controlled at 126/88 mmHg with lisinopril  and metoprolol . Continue lisinopril  20 mg daily and Toprol  XL 25 mg daily. Check CMP today.

## 2023-07-28 NOTE — Assessment & Plan Note (Addendum)
 Chronic knee pain is managed with meloxicam , which he prefers over Celebrex . No new symptoms. Discontinue Celebrex  and restart meloxicam  15 mg daily as needed.

## 2023-08-05 DIAGNOSIS — H26491 Other secondary cataract, right eye: Secondary | ICD-10-CM | POA: Diagnosis not present

## 2023-08-05 DIAGNOSIS — Z961 Presence of intraocular lens: Secondary | ICD-10-CM | POA: Diagnosis not present

## 2023-08-05 DIAGNOSIS — H43813 Vitreous degeneration, bilateral: Secondary | ICD-10-CM | POA: Diagnosis not present

## 2023-08-05 DIAGNOSIS — H4912 Fourth [trochlear] nerve palsy, left eye: Secondary | ICD-10-CM | POA: Diagnosis not present

## 2023-09-06 ENCOUNTER — Other Ambulatory Visit: Payer: Self-pay | Admitting: Family Medicine

## 2023-09-13 NOTE — Progress Notes (Unsigned)
 Cardiology Office Note  Date:  09/14/2023   ID:  Neil Bush, DOB 02-27-1951, MRN [id number]  PCP:  Gretel App, NP   Chief Complaint  Patient presents with   12 month follow up     Doing well.     HPI:  73 year old male with a prior history of  paroxysmal atrial fibrillation, 01/26/2016, 2020 CHA2DS2 VASC score of 2 (HTN, age x1).  hypertension,  hyperlipidemia,  chest pain , Stress test September 30, 2018 No ischemia low risk study CT calcium  score 0 who presents for follow-up of his paroxysmal atrial fibrillation  Last seen in the office by myself on 7/24 having rare episodes of atrial fibrillation, typically lasting less than 2 hours Rarely has to take flecainide , possibly 3 times over the past year  05/2023: Developed numbness tingling left side of his mouth and 3 of his fingers Seen by neurology given gabapentin MRI brain with no stroke Symptoms resolved but came back several months later, again back on gabapentin 100 twice daily  Blood pressure well-controlled  Weight down 6 pounds from last year [place] Lots of exercise and activity, push walking, weed whacking, mowing  Takes metoprolol  succinate 25 daily, lisinopril  10 daily  Lab work reviewed Total cholesterol 131 LDL 66  CT coronary calcium  score of 0 November 2021  Tolerating Xarelto , expensive  EKG personally reviewed by myself on todays visit EKG Interpretation Date/Time:  Monday September 14 2023 08:04:31 EDT Ventricular Rate:  55 PR Interval:  220 QRS Duration:  84 QT Interval:  408 QTC Calculation: 390 R Axis:   20  Text Interpretation: Sinus bradycardia with 1st degree A-V block When compared with ECG of 23-Feb-2023 11:00, PR interval has increased Confirmed by Perla Lye [phone]) on 09/14/2023 8:06:45 AM   past medical history reviewed Seen in the hospital end of July 2020 emergency room September 29, 2018 for chest pain, atrial fibrillation Reports that he was sitting in his truck  getting ready to go to work when he developed tightness in his chest.  Felt his pulse, appreciated it was rapid and irregular Symptoms started approximately 6:30 AM and persisted Noted to be in atrial fibrillation with RVR rate 140 bpm He presented to the emergency room, converted to normal sinus rhythm around 11:30 AM tnt 200 Converted 2 normal sinus rhythm after 5 hours in the emergency room   EKG from the  emergency room showing confirming atrial fibrillation ventricular rate 140 bpm Stress test September 30, 2018 No ischemia low risk study  November 2017 he was admitted with chest pain and A. fib and converted to sinus rhythm on IV diltiazem .  Started on Xarelto  in the setting of a CHA2DS2VASc of 2.   stress testing which did not show ischemia.  December 2017 Echo showed normal LV function.    PMH:   has a past medical history of Chest pain, Dysrhythmia, Headache, Hyperlipidemia, Hypertension, PAF (paroxysmal atrial fibrillation) (HCC), Palpitations, and Skin cancer.  PSH:    Past Surgical History:  Procedure Laterality Date   CARDIAC CATHETERIZATION     [facility]    CHOLECYSTECTOMY     COLONOSCOPY WITH PROPOFOL  N/A 04/17/2017   Procedure: COLONOSCOPY WITH PROPOFOL ;  Surgeon: Jinny Carmine, MD;  Location: [facility];  Service: Endoscopy;  Laterality: N/A;   COLONOSCOPY WITH PROPOFOL  N/A 04/21/2022   Procedure: COLONOSCOPY WITH BIOPSY;  Surgeon: Jinny Carmine, MD;  Location: [facility];  Service: Endoscopy;  Laterality: N/A;   EYE SURGERY  FOOT SURGERY     HERNIA REPAIR     X 2 ingunial   KNEE SURGERY Right    POLYPECTOMY  04/17/2017   Procedure: POLYPECTOMY INTESTINAL;  Surgeon: Jinny Carmine, MD;  Location: [facility];  Service: Endoscopy;;   POLYPECTOMY  04/21/2022   Procedure: POLYPECTOMY;  Surgeon: Jinny Carmine, MD;  Location: [facility];  Service: Endoscopy;;    Current Outpatient Medications  Medication Sig Dispense Refill   atorvastatin   (LIPITOR) 10 MG tablet Take 1 tablet (10 mg total) by mouth daily. 90 tablet 3   flecainide  (TAMBOCOR ) 100 MG tablet Take 1 tablet (100 mg total) by mouth 2 (two) times daily as needed (daily PRN for atrial fibrillation). 90 tablet 3   gabapentin (NEURONTIN) 100 MG capsule Take 100 mg by mouth 2 (two) times daily.     lisinopril  (ZESTRIL ) 20 MG tablet Take 1 tablet (20 mg total) by mouth daily. 90 tablet 3   meloxicam  (MOBIC ) 15 MG tablet Take 1 tablet (15 mg total) by mouth daily as needed for pain. 90 tablet 1   metoprolol  succinate (TOPROL -XL) 50 MG 24 hr tablet Take 1 tablet (50 mg total) by mouth daily. Take with or immediately following a meal. 90 tablet 3   XARELTO  20 MG TABS tablet TAKE 1 TABLET(20 MG) BY MOUTH DAILY 90 tablet 1   Current Facility-Administered Medications  Medication Dose Route Frequency Provider Last Rate Last Admin   triamcinolone  acetonide (KENALOG -40) injection 20 mg  20 mg Other Once Hyatt, Max T, DPM        Allergies:   Penicillin g and Penicillins   Social History:  The patient  reports that he has never smoked. He has never used smokeless tobacco. He reports that he does not drink alcohol and does not use drugs.   Family History:   family history includes Cancer in his mother; Diabetes in his father; Heart disease in his brother and father; Hypertension in his father.    Review of Systems: Review of Systems  Constitutional: Negative.   HENT: Negative.    Respiratory: Negative.    Cardiovascular: Negative.   Gastrointestinal: Negative.   Musculoskeletal: Negative.   Neurological: Negative.   Psychiatric/Behavioral: Negative.    All other systems reviewed and are negative.   PHYSICAL EXAM: VS:  BP 120/80 (BP Location: Left Arm, Patient Position: Sitting, Cuff Size: Normal)   Pulse (!) 55   Ht 5' 10 (1.778 m)   Wt 174 lb 6 oz (79.1 kg)   SpO2 95%   BMI 25.02 kg/m  , BMI Body mass index is 25.02 kg/m. Constitutional:  oriented to person, place,  and time. No distress.  HENT:  Head: Grossly normal Eyes:  no discharge. No scleral icterus.  Neck: No JVD, no carotid bruits  Cardiovascular: Regular rate and rhythm, no murmurs appreciated Pulmonary/Chest: Clear to auscultation bilaterally, no wheezes or rails Abdominal: Soft.  no distension.  no tenderness.  Musculoskeletal: Normal range of motion Neurological:  normal muscle tone. Coordination normal. No atrophy Skin: Skin warm and dry Psychiatric: normal affect, pleasant   Recent Labs: 02/23/2023: Hemoglobin 16.4; Platelets 249 07/28/2023: ALT 15; BUN 24; Creatinine, Ser 1.14; Potassium 4.8; Sodium 139    Lipid Panel Lab Results  Component Value Date   CHOL 131 07/28/2023   HDL 39.80 07/28/2023   LDLCALC 66 07/28/2023   TRIG 125.0 07/28/2023      Wt Readings from Last 3 Encounters:  09/14/23 174 lb 6 oz (79.1 kg)  07/28/23 180 lb 3.2 oz (81.7 kg)  07/15/23 180 lb (81.6 kg)      ASSESSMENT AND PLAN:  Paroxysmal atrial fibrillation (HCC) - Continue Xarelto  20 mg daily,   Continue metoprolol  succinate 50 daily Recommend for breakthrough atrial fibrillation spells he take diltiazem  60 mg x 1 Flecainide  100 mg as needed if A-fib does not break  Essential hypertension Blood pressure is well controlled on today's visit. No changes made to the medications. Metoprolol  succinate  50 daily,lisinopril  10 daily  Mixed hyperlipidemia Continue Lipitor 20 Goal  Chest pain, unspecified type Recent chest pain  calcium  score of 0  Shortness of breath Denies significant symptoms  DOT clearance Clearance can be given if needed  Facial numbness, hand numbness Followed by neurology  MRI brain with no stroke Gabapentin as needed   Orders Placed This Encounter  Procedures   EKG 12-Lead     Signed, Velinda Lunger, M.D., Ph.D. 09/14/2023  [facility] [place], [place] [phone]

## 2023-09-14 ENCOUNTER — Ambulatory Visit: Attending: Cardiovascular Disease | Admitting: Cardiovascular Disease

## 2023-09-14 ENCOUNTER — Encounter: Payer: Self-pay | Admitting: Cardiovascular Disease

## 2023-09-14 VITALS — BP 120/80 | HR 55 | Ht 70.0 in | Wt 174.4 lb

## 2023-09-14 DIAGNOSIS — I351 Nonrheumatic aortic (valve) insufficiency: Secondary | ICD-10-CM | POA: Diagnosis not present

## 2023-09-14 DIAGNOSIS — R079 Chest pain, unspecified: Secondary | ICD-10-CM | POA: Diagnosis not present

## 2023-09-14 DIAGNOSIS — E782 Mixed hyperlipidemia: Secondary | ICD-10-CM | POA: Diagnosis not present

## 2023-09-14 DIAGNOSIS — R0600 Dyspnea, unspecified: Secondary | ICD-10-CM | POA: Diagnosis not present

## 2023-09-14 DIAGNOSIS — I480 Paroxysmal atrial fibrillation: Secondary | ICD-10-CM

## 2023-09-14 DIAGNOSIS — E785 Hyperlipidemia, unspecified: Secondary | ICD-10-CM

## 2023-09-14 DIAGNOSIS — I10 Essential (primary) hypertension: Secondary | ICD-10-CM

## 2023-09-14 MED ORDER — LISINOPRIL 20 MG PO TABS: 20.0000 mg | ORAL_TABLET | Freq: Every day | ORAL | 3 refills | Status: AC

## 2023-09-14 MED ORDER — ATORVASTATIN CALCIUM 10 MG PO TABS: 10.0000 mg | ORAL_TABLET | Freq: Every day | ORAL | 3 refills | Status: AC

## 2023-09-14 MED ORDER — METOPROLOL SUCCINATE ER 50 MG PO TB24: 50.0000 mg | ORAL_TABLET | Freq: Every day | ORAL | 3 refills | Status: AC

## 2023-09-14 MED ORDER — DILTIAZEM HCL 60 MG PO TABS: 60.0000 mg | ORAL_TABLET | Freq: Three times a day (TID) | ORAL | 1 refills | Status: AC | PRN

## 2023-09-14 NOTE — Patient Instructions (Addendum)
 Medication Instructions:   Please take diltiazem  60 mg x 1 for afib spells  If afib does not break after one hour,Take flecainide   If you need a refill on your cardiac medications before your next appointment, please call your pharmacy.   Lab work: No new labs needed  Testing/Procedures: No new testing needed  Follow-Up: At [facility], you and your health needs are our priority.  As part of our continuing mission to provide you with exceptional heart care, we have created designated Provider Care Teams.  These Care Teams include your primary Cardiologist (physician) and Advanced Practice Providers (APPs -  Physician Assistants and Nurse Practitioners) who all work together to provide you with the care you need, when you need it.  You will need a follow up appointment in 12 months  Providers on your designated Care Team:   Lonni Meager, NP Bernardino Bring, PA-C Cadence [place], [place]  COVID-19 Vaccine Information can be found at: [email or web address] For questions related to vaccine distribution or appointments, please email [email or web address] or call [phone]. ]

## 2023-11-10 DIAGNOSIS — A6920 Lyme disease, unspecified: Secondary | ICD-10-CM | POA: Diagnosis not present

## 2023-11-20 ENCOUNTER — Ambulatory Visit

## 2023-12-15 DIAGNOSIS — M7541 Impingement syndrome of right shoulder: Secondary | ICD-10-CM | POA: Diagnosis not present

## 2023-12-17 ENCOUNTER — Encounter: Payer: Self-pay | Admitting: Pharmacist

## 2023-12-17 NOTE — Progress Notes (Signed)
 Pharmacy Quality Measure Review  This patient is appearing on a report for being at risk of failing the adherence measure for hypertension (ACEi/ARB) medications this calendar year.   Medication: lisinopril  20 mg  Last fill date: 07/23/23 for 90 day supply  Unclear if patient is taking half tablet per day, or if typo in cardiology note stating 10 mg daily dose. Current prescription = 20 mg tablets, 1 daily though has not refilled in 5 months.   Attempted to contact patient to clarify, no answer.

## 2024-01-11 DIAGNOSIS — M7541 Impingement syndrome of right shoulder: Secondary | ICD-10-CM | POA: Diagnosis not present

## 2024-01-13 DIAGNOSIS — M7541 Impingement syndrome of right shoulder: Secondary | ICD-10-CM | POA: Diagnosis not present

## 2024-01-15 DIAGNOSIS — S46011A Strain of muscle(s) and tendon(s) of the rotator cuff of right shoulder, initial encounter: Secondary | ICD-10-CM | POA: Diagnosis not present

## 2024-01-15 DIAGNOSIS — M7541 Impingement syndrome of right shoulder: Secondary | ICD-10-CM | POA: Diagnosis not present

## 2024-02-03 ENCOUNTER — Ambulatory Visit: Admitting: Nurse Practitioner

## 2024-02-09 ENCOUNTER — Ambulatory Visit: Admitting: Nurse Practitioner

## 2024-03-13 ENCOUNTER — Other Ambulatory Visit: Payer: Self-pay | Admitting: Nurse Practitioner

## 2024-03-13 DIAGNOSIS — I480 Paroxysmal atrial fibrillation: Secondary | ICD-10-CM

## 2024-04-13 ENCOUNTER — Ambulatory Visit: Admitting: Nurse Practitioner

## 2024-07-15 ENCOUNTER — Ambulatory Visit
